# Patient Record
Sex: Male | Born: 1950 | Race: White | Hispanic: No | Smoking: Never smoker
Health system: Southern US, Community
[De-identification: ages and names within clinical notes are randomized; demographics above are authoritative.]

## PROBLEM LIST (undated history)

## (undated) DIAGNOSIS — E7800 Pure hypercholesterolemia, unspecified: Secondary | ICD-10-CM

## (undated) DIAGNOSIS — K449 Diaphragmatic hernia without obstruction or gangrene: Secondary | ICD-10-CM

## (undated) DIAGNOSIS — R7303 Prediabetes: Secondary | ICD-10-CM

## (undated) DIAGNOSIS — G479 Sleep disorder, unspecified: Secondary | ICD-10-CM

## (undated) DIAGNOSIS — G8929 Other chronic pain: Secondary | ICD-10-CM

## (undated) DIAGNOSIS — R32 Unspecified urinary incontinence: Secondary | ICD-10-CM

## (undated) DIAGNOSIS — I10 Essential (primary) hypertension: Secondary | ICD-10-CM

## (undated) DIAGNOSIS — M549 Dorsalgia, unspecified: Secondary | ICD-10-CM

## (undated) DIAGNOSIS — E785 Hyperlipidemia, unspecified: Secondary | ICD-10-CM

## (undated) DIAGNOSIS — N35919 Unspecified urethral stricture, male, unspecified site: Secondary | ICD-10-CM

## (undated) DIAGNOSIS — M545 Low back pain, unspecified: Secondary | ICD-10-CM

## (undated) DIAGNOSIS — Z972 Presence of dental prosthetic device (complete) (partial): Secondary | ICD-10-CM

## (undated) DIAGNOSIS — N529 Male erectile dysfunction, unspecified: Secondary | ICD-10-CM

## (undated) DIAGNOSIS — K219 Gastro-esophageal reflux disease without esophagitis: Secondary | ICD-10-CM

## (undated) DIAGNOSIS — F1190 Opioid use, unspecified, uncomplicated: Secondary | ICD-10-CM

## (undated) DIAGNOSIS — M5137 Other intervertebral disc degeneration, lumbosacral region: Secondary | ICD-10-CM

## (undated) DIAGNOSIS — Q396 Congenital diverticulum of esophagus: Secondary | ICD-10-CM

## (undated) DIAGNOSIS — Z860101 Personal history of adenomatous and serrated colon polyps: Secondary | ICD-10-CM

## (undated) DIAGNOSIS — C61 Malignant neoplasm of prostate: Secondary | ICD-10-CM

## (undated) DIAGNOSIS — Z8739 Personal history of other diseases of the musculoskeletal system and connective tissue: Secondary | ICD-10-CM

## (undated) DIAGNOSIS — Z8619 Personal history of other infectious and parasitic diseases: Secondary | ICD-10-CM

## (undated) DIAGNOSIS — M51379 Other intervertebral disc degeneration, lumbosacral region without mention of lumbar back pain or lower extremity pain: Secondary | ICD-10-CM

## (undated) DIAGNOSIS — R200 Anesthesia of skin: Secondary | ICD-10-CM

## (undated) DIAGNOSIS — Z923 Personal history of irradiation: Secondary | ICD-10-CM

## (undated) DIAGNOSIS — C801 Malignant (primary) neoplasm, unspecified: Secondary | ICD-10-CM

## (undated) DIAGNOSIS — R0683 Snoring: Secondary | ICD-10-CM

## (undated) DIAGNOSIS — M1990 Unspecified osteoarthritis, unspecified site: Secondary | ICD-10-CM

## (undated) DIAGNOSIS — D509 Iron deficiency anemia, unspecified: Secondary | ICD-10-CM

## (undated) DIAGNOSIS — Z86010 Personal history of colonic polyps: Secondary | ICD-10-CM

## (undated) HISTORY — DX: Gastro-esophageal reflux disease without esophagitis: K21.9

## (undated) HISTORY — PX: COLONOSCOPY: SHX174

## (undated) HISTORY — PX: UPPER GASTROINTESTINAL ENDOSCOPY: SHX188

## (undated) HISTORY — DX: Personal history of other infectious and parasitic diseases: Z86.19

## (undated) HISTORY — PX: EYE SURGERY: SHX253

## (undated) HISTORY — DX: Other chronic pain: G89.29

## (undated) HISTORY — DX: Snoring: R06.83

## (undated) HISTORY — DX: Dorsalgia, unspecified: M54.9

## (undated) HISTORY — PX: NECK SURGERY: SHX720

## (undated) HISTORY — DX: Personal history of irradiation: Z92.3

## (undated) HISTORY — DX: Unspecified osteoarthritis, unspecified site: M19.90

## (undated) HISTORY — DX: Sleep disorder, unspecified: G47.9

---

## 2004-07-25 HISTORY — PX: CATARACT EXTRACTION W/ INTRAOCULAR LENS IMPLANT: SHX1309

## 2005-02-11 DIAGNOSIS — C801 Malignant (primary) neoplasm, unspecified: Secondary | ICD-10-CM | POA: Insufficient documentation

## 2005-02-11 HISTORY — DX: Malignant (primary) neoplasm, unspecified: C80.1

## 2005-03-25 HISTORY — PX: PROSTATECTOMY: SHX69

## 2005-04-18 ENCOUNTER — Encounter (INDEPENDENT_AMBULATORY_CARE_PROVIDER_SITE_OTHER): Payer: Self-pay | Admitting: *Deleted

## 2005-04-18 ENCOUNTER — Inpatient Hospital Stay (HOSPITAL_COMMUNITY): Admission: RE | Admit: 2005-04-18 | Discharge: 2005-04-21 | Payer: Self-pay | Admitting: Urology

## 2005-04-18 HISTORY — PX: RETROPUBIC PROSTATECTOMY: SUR1055

## 2005-06-29 ENCOUNTER — Encounter (HOSPITAL_COMMUNITY): Admission: RE | Admit: 2005-06-29 | Discharge: 2005-09-27 | Payer: Self-pay | Admitting: Urology

## 2005-07-12 ENCOUNTER — Ambulatory Visit: Admission: RE | Admit: 2005-07-12 | Discharge: 2005-07-15 | Payer: Self-pay | Admitting: Radiation Oncology

## 2005-07-28 ENCOUNTER — Ambulatory Visit: Admission: RE | Admit: 2005-07-28 | Discharge: 2005-09-30 | Payer: Self-pay | Admitting: Radiation Oncology

## 2009-07-16 ENCOUNTER — Ambulatory Visit (HOSPITAL_BASED_OUTPATIENT_CLINIC_OR_DEPARTMENT_OTHER): Admission: RE | Admit: 2009-07-16 | Discharge: 2009-07-16 | Payer: Self-pay | Admitting: Urology

## 2009-07-16 HISTORY — PX: CYSTOSCOPY WITH URETHRAL DILATATION: SHX5125

## 2010-12-10 NOTE — Op Note (Signed)
NAME:  Matthew [name], Matthew [name] NO.:  [id number]   MEDICAL RECORD NO.:  [id number]          PATIENT TYPE:  INP   LOCATION:  X003                         FACILITY:  [facility]   PHYSICIAN:  Bertram Millard. Dahlstedt, M.D.DATE OF BIRTH:  02-16-1951   DATE OF PROCEDURE:  04/18/2005  DATE OF DISCHARGE:                                 OPERATIVE REPORT   PREOPERATIVE DIAGNOSIS:  Prostate cancer.   POSTOPERATIVE DIAGNOSIS:  Prostate cancer.   PROCEDURE:  Radical retropubic prostatectomy with bilateral pelvic lymph  node dissection.   SURGEON:  Bertram Millard. Dahlstedt, MD.   ASSISTANT:  Glade Nurse, MD.   ANESTHESIA:  General.   SPECIMENS:  1.  Bilateral pelvic lymph node packets.  2.  Prostate with seminal vesicles.   COMPLICATIONS:  None.   HISTORY OF PRESENT ILLNESS:  The patient presented with a PSA of 4.3. He  underwent TRUS biopsy of his prostate which revealed Gleason 3+3=6  bilaterally with 40% of tissue on the left which was associated with  nodularity and approximately 15% on the right. After reviewing his options,  he has elected to proceed with retropubic prostatectomy.   DESCRIPTION OF PROCEDURE:  The patient was identified by his wrist bracelet  and brought to room 10 where he received preoperative antibiotics and  prepped and draped in the usual sterile fashion. Next a 20-French Foley  catheter was inserted into the anterior urethra. We made a low midline  incision from the pubic symphysis to approximately 3 cm below the umbilicus.  We carried it down to the anterior fascial sheath with Bovie cautery  maintaining hemostasis. The linea alba was identified and split with Bovie  cautery. We dissected bluntly between the Pyramidalis and rectus. Next  Bookwalter was set. The space of Retzius was developed and we set our  retractors to expose the left obturator lymph node packet. We identified the  external iliac vein and artery and the inferior line obturator nerve.  Next  using the split and roll technique on the external iliac vein, we removed  all tissue between the external vein superiorly, the side wall laterally and  the obturator nerve inferiorly being careful not to damage the obturator  nerve in the dissection. We ligated and divided the pelvic lymph node packet  with Weck clips sharply. It was passed off the field for pathologic  analysis. We then repeated this procedure on the patient's right. Next we  reset the retractors and leveled the table. Using a sponge on a stick and a  Kitner, we bluntly dissected off the fatty tissue overlying the dorsal  venous complex as well as laterally. We identified the groove between the  levator and the prostate and divided the endopelvic fascia with Bovie  cautery sharply along this line bilaterally. Next, we identified the  puboprostatic ligaments and divided them sharply with Metzenbaum scissors.  Next we palpated a groove between the dorsal venous complex and the urethra  distal to the apex of the prostate. We passed a Hoenfeltner pelvic clamp  between this plane and ligated the dorsal venous complex with the  2-0  Vicryl. Next, we repeated this process and again ligated it with a 2-0  Vicryl. We then further retracted the bladder and prostate with a Stamey  retractor and passed the distal end of the aforementioned suture which was  on a needle through the dorsal venous complex distally to this and then  through the underside of the pubic symphysis and tied it. Next we placed a  back bleeding stitch with an interrupted 2-0 figure-of-eight Vicryl at the  level of the base of the prostate. Next, we divided the dorsal venous  complex proximal to the aforementioned sutures with Bovie cautery. The  prostate dropped away nicely hemostasis, hemostasis was further augmented by  placing interrupted figure-of-eight ties in the anterior surface of the  prostate. Next we bluntly, with Kittner, delineated the urethra  distal to  the apex of the prostate. We passed the right angle clamp behind it and  passed an umbilical tape through this. We divided the anterior surface of  the urethra with a knife distal to the apex prostate. The Foley catheter was  divided at the __________ and brought through. Next using the Foley as a  retractor and pulling up on the aforementioned umbilical tape, we divided  the posterior half of the urethra sharply with Metzenbaum scissors. Next we  developed the plane between the apex of the prostate and Denonvilliers'  fascia sharply with Metzenbaum scissors and proceeded in the midline towards  the base of the prostate. Once this was developed, we addressed our  prostatic pedicles dissecting them with right angle clamp, ligating them  with Weck clips and dividing sharply. We proceeded in this fashion  bilaterally to the base of prostate. Next we turned our attention to the  bladder neck and using fine dissection, we dissected down with Vanderbilt  tonsil in the plane between the base of the bladder and the base of the  prostate exposing only the urethra at this level which was then incised  sharply over the anterior half with a knife. The Foley catheter was brought  through this division and the remainder of the urethra was divided with  Metzenbaum scissors. Next the remaining small amount of pedicle at the  prostatic base was divided as described above. Next, this left only the  seminal vesicles and vas deferens. The vas on each side was dissected out  sharply and ligated and divided with Weck clips and with Metzenbaum  scissors. This leaving only the seminal vesicles which were ligated with  Weck clips and divided. Next, the prostate was passed off the field. We  inspected the pelvic fossa and found it to be hemostatic. Next, we turned  our attention to the bladder neck for reconstruction. It required one single interrupted 2-0 chromic at the __________ o'clock position of  bladder neck,  to tennis racket the bladder neck. We then everted the mucosa  circumferentially with interrupted 4-0 chromic. Next we turned our attention  to the urethrovesical anastomosis. We placed a Greenwald sound as a guide  and placed sutures from an outside-in fashion at the 12, 2, 4, 87 and 10  o'clock positions. These were then brought through the bladder neck at an  inside out fashion at the corresponding levels. Next we removed the  [facility] sound and placed a 20-French Foley catheter inserted into the  bladder inflating the balloon with 15 mL of sterile water. Next the  retractors were removed and the anastomosis was completed by tying each  suture sequentially starting  posteriorly and moving anteriorly. We then  flushed the Foley catheter, it was found to be watertight and flushed  quantitatively. Next we placed a Blake drain in the right lower quadrant by  making a 0.5 cm transverse incision in the skin dissecting down through the  dermis with Bovie cautery. We then created a tunnel with Vanderbilt tonsil  and brought the blade through on a pass so it was secured in with  interrupted 3-0 nylon and connected to bulb. The fascia was then closed with  a #2 running PDS. The skin was closed with a with running subcuticular 4-0  PDS. Dressings were applied. The patient was reversed from his anesthesia  which he tolerated without complication. Please note Dr. Marcine Matar  was present and participated in all aspects of this case.     ______________________________  Glade Nurse, MD      Bertram Millard. Dahlstedt, M.D.  Electronically Signed    MT/MEDQ  D:  04/18/2005  T:  04/18/2005  Job:  [id number]

## 2010-12-10 NOTE — H&P (Signed)
NAME:  Matthew [name], Matthew [name] NO.:  [id number]   MEDICAL RECORD NO.:  [id number]          PATIENT TYPE:  INP   LOCATION:  X003                         FACILITY:  [facility]   PHYSICIAN:  Bertram Millard. Dahlstedt, M.D.DATE OF BIRTH:  November 03, 1950   DATE OF ADMISSION:  04/18/2005  DATE OF DISCHARGE:                                HISTORY & PHYSICAL   REASON FOR ADMISSION:  Prostate cancer.   BRIEF HISTORY:  Matthew [name] is a 60 year old male with biopsy-diagnosed  adenocarcinoma of the prostate.  He initially presented from Dr. Laurann Montana in June with a PSA of 4.3.  He underwent an ultrasound and biopsy.  This showed bilateral adenocarcinoma of the prostate.  He had a nodule of  the prostate on the left, and 60% of the tissue submitted on that side had  cancer.  On the right, 15% of the tissue submitted had cancer.  He has a  rather small gland.  No metastatic evaluation was performed due to the  patient's low PSA and relatively low Gleason  score.   He presented to my office for a prostate cancer consultation earlier last  month.  He has decided to undergo radical prostatectomy.  He has chosen this  from other treatments, including HIFU, radiation, and cryotherapy.  He is  aware of risks and complications of radical prostatectomy, including but not  limited to infection, bleeding, need for transfusion, rectal injury, urinary  incontinence, erectile dysfunction, DVT, and PE, among others.  He  understands these and desires to proceed.   His past medical history is significant for hypercholesterolemia.   The patient is married and has 1 grown child.  He is a Transport planner.  He chews tobacco, denies any cigarette use.  He does not  drink alcohol.  He is a native of [place].   FAMILY HISTORY:  Significant for sarcoma, diabetes, and apparently what  sounds like a cardiac tumor in his mother.   REVIEW OF SYSTEMS:  Noncontributory.  He has no erectile  dysfunction.   PHYSICAL EXAMINATION:  GENERAL:  A pleasant, healthy-appearing, middle-age  male.  VITAL SIGNS:  Blood pressure 112/71, pulse 66, respiratory rate 20,  temperature 99.  NECK:  Supple without thyromegaly or adenopathy.  CHEST:  Clear.  HEART:  Normal rate and rhythm.  ABDOMEN:  Soft, nondistended, nontender.  No masses, no megaly.  Bowel  sounds present.  No CVA tenderness or flank masses.  He had no inguinal  hernias.  GENITALIA:  Phallus was uncircumcised.  Foreskin retracts easily.  No penile  plaques, lesions, or fibrotic areas.  Glans and meatus normal.  Scrotal skin  normal.  Testicles normal.  Cords and epididymal structures normal.  Normal  anal sphincter tone.  Gland was 1+, with a left-sided nodule 3-4 mm in size.  Seminal vesicles were nonpalpable.  EXTREMITIES:  He had normal distal pedal pulses.  NEUROLOGIC:  Grossly intact.   Admission data included an EKG which was normal, chest x-ray which was  normal except for peribronchial thickening.  BMET and CBC were normal.  IMPRESSION:  1.  Hypercholesterolemia.  2.  Prostate cancer, clinical stage T1C, Gleason 6.   PLAN:  Admit for radical prostatectomy.      Bertram Millard. Dahlstedt, M.D.  Electronically Signed     SMD/MEDQ  D:  04/18/2005  T:  04/18/2005  Job:  [id number]   cc:   [name]. Cliffton Asters, M.D.  Fax: [phone]

## 2010-12-10 NOTE — Discharge Summary (Signed)
NAME:  Matthew [name], BATTERTON NO.:  [id number]   MEDICAL RECORD NO.:  [id number]          PATIENT TYPE:  INP   LOCATION:  1412                         FACILITY:  [facility]   PHYSICIAN:  Bertram Millard. Dahlstedt, M.D.DATE OF BIRTH:  [birth year]   DATE OF ADMISSION:  04/18/2005  DATE OF DISCHARGE:  04/21/2005                                 DISCHARGE SUMMARY   DISCHARGE DIAGNOSES:  Prostate cancer.   PROCEDURE:  Right upper retropubic prostatectomy with bilateral pelvic lymph  node dissection.   SURGEON:  Bertram Millard. Dahlstedt, M.D.   CONSULTATIONS:  None.   PRIMARY CARE PHYSICIAN:  [name]. Cliffton Asters, M.D.   HISTORY OF PRESENT ILLNESS:  This is a 60 year old gentleman who initially  was noted to have a PSA of 4.3. He underwent TRUS prostate biopsy which  demonstrated Gleason 3+3=6 bilaterally with 40% on the left and 15% on the  right. After reviewing his options, he elected to proceed with surgical  treatment.   HOSPITAL COURSE:  On April 18, 2005, the patient was taken to the  operating room and underwent the above named procedure which he tolerated  without complications. He was then taken to the PACU in stable condition  where he remained throughout his hospital stay. His hospital stay consisted  of progressive toleration of ambulation, pain and diet. On postoperative day  #3, his Jackson-Pratt drain output flowed to less then 10 mL per shift, it  was removed. It was then determined the patient was in stable condition to  be discharged home.   DISCHARGE INSTRUCTIONS:  The patient is instructed not to lift more than 10  pounds for the next 6 weeks. He is given routine wound care instructions,  instructed to call or return if he experiences any fever, chills, redness or  drainage from his wound or nausea or vomiting. He is instructed not to drive  while on narcotics or for the next 2 weeks. He is given Foley care  instructions and instructed to call or return  immediately if he experiences  any problems with his Foley catheter. He understands and agrees with these  instructions. He is also instructed to followup with Dr. [name] as  scheduled for cath removal as well as wound check.   DISCHARGE MEDICATIONS:  1.  Vicodin.  2.  Colace.  3.  Ciprofloxacin to begin 1 day prior to appointment with Dr. [name].     ______________________________  Glade Nurse, MD      Bertram Millard. Dahlstedt, M.D.  Electronically Signed    MT/MEDQ  D:  05/05/2005  T:  05/05/2005  Job:  [id number]

## 2011-07-16 ENCOUNTER — Ambulatory Visit (INDEPENDENT_AMBULATORY_CARE_PROVIDER_SITE_OTHER): Payer: BC Managed Care – PPO

## 2011-07-16 DIAGNOSIS — S065X9A Traumatic subdural hemorrhage with loss of consciousness of unspecified duration, initial encounter: Secondary | ICD-10-CM

## 2011-07-16 DIAGNOSIS — M25549 Pain in joints of unspecified hand: Secondary | ICD-10-CM

## 2011-10-17 ENCOUNTER — Other Ambulatory Visit: Payer: Self-pay | Admitting: Urology

## 2011-10-17 DIAGNOSIS — C61 Malignant neoplasm of prostate: Secondary | ICD-10-CM

## 2011-10-26 ENCOUNTER — Encounter (HOSPITAL_COMMUNITY)
Admission: RE | Admit: 2011-10-26 | Discharge: 2011-10-26 | Disposition: A | Discharge status: Home / Self Care | Payer: BC Managed Care – PPO | Source: Ambulatory Visit | Attending: Urology | Admitting: Urology

## 2011-10-26 DIAGNOSIS — C61 Malignant neoplasm of prostate: Secondary | ICD-10-CM | POA: Insufficient documentation

## 2011-10-26 MED ORDER — TECHNETIUM TC 99M MEDRONATE IV KIT
25.0000 | PACK | Freq: Once | INTRAVENOUS | Status: AC | PRN
  Administered 2011-10-26: 25 via INTRAVENOUS

## 2011-10-28 ENCOUNTER — Other Ambulatory Visit: Payer: Self-pay | Admitting: Urology

## 2011-10-28 DIAGNOSIS — C61 Malignant neoplasm of prostate: Secondary | ICD-10-CM

## 2011-10-31 ENCOUNTER — Other Ambulatory Visit (HOSPITAL_COMMUNITY): Payer: Self-pay | Admitting: Physician Assistant

## 2011-11-01 ENCOUNTER — Ambulatory Visit (HOSPITAL_COMMUNITY)
Admission: RE | Admit: 2011-11-01 | Discharge: 2011-11-01 | Disposition: A | Discharge status: Home / Self Care | Payer: BC Managed Care – PPO | Source: Ambulatory Visit | Attending: Urology | Admitting: Urology

## 2011-11-01 ENCOUNTER — Encounter (HOSPITAL_COMMUNITY): Payer: Self-pay

## 2011-11-01 VITALS — BP 144/67 | HR 75 | Temp 98.0°F | Resp 18 | Ht 68.0 in | Wt 196.0 lb

## 2011-11-01 DIAGNOSIS — E7800 Pure hypercholesterolemia, unspecified: Secondary | ICD-10-CM | POA: Insufficient documentation

## 2011-11-01 DIAGNOSIS — R972 Elevated prostate specific antigen [PSA]: Secondary | ICD-10-CM | POA: Insufficient documentation

## 2011-11-01 DIAGNOSIS — Z8546 Personal history of malignant neoplasm of prostate: Secondary | ICD-10-CM | POA: Insufficient documentation

## 2011-11-01 DIAGNOSIS — C61 Malignant neoplasm of prostate: Secondary | ICD-10-CM

## 2011-11-01 DIAGNOSIS — Z923 Personal history of irradiation: Secondary | ICD-10-CM | POA: Insufficient documentation

## 2011-11-01 DIAGNOSIS — Z9079 Acquired absence of other genital organ(s): Secondary | ICD-10-CM | POA: Insufficient documentation

## 2011-11-01 DIAGNOSIS — I889 Nonspecific lymphadenitis, unspecified: Secondary | ICD-10-CM | POA: Insufficient documentation

## 2011-11-01 DIAGNOSIS — R599 Enlarged lymph nodes, unspecified: Secondary | ICD-10-CM | POA: Insufficient documentation

## 2011-11-01 HISTORY — DX: Pure hypercholesterolemia, unspecified: E78.00

## 2011-11-01 HISTORY — DX: Essential (primary) hypertension: I10

## 2011-11-01 HISTORY — DX: Malignant (primary) neoplasm, unspecified: C80.1

## 2011-11-01 LAB — CBC
HCT: 43.2 % (ref 39.0–52.0)
MCV: 91.1 fL (ref 78.0–100.0)
RBC: 4.74 MIL/uL (ref 4.22–5.81)
WBC: 5.5 10*3/uL (ref 4.0–10.5)

## 2011-11-01 MED ORDER — MIDAZOLAM HCL 2 MG/2ML IJ SOLN
INTRAMUSCULAR | Status: AC
  Filled 2011-11-01: qty 4

## 2011-11-01 MED ORDER — MIDAZOLAM HCL 5 MG/5ML IJ SOLN
INTRAMUSCULAR | Status: AC | PRN
  Administered 2011-11-01: 2 mg via INTRAVENOUS
  Administered 2011-11-01: 1 mg via INTRAVENOUS

## 2011-11-01 MED ORDER — HYDROCODONE-ACETAMINOPHEN 5-325 MG PO TABS
ORAL_TABLET | ORAL | Status: AC
Start: 2011-11-01 — End: 2011-11-01
  Filled 2011-11-01: qty 2

## 2011-11-01 MED ORDER — SODIUM CHLORIDE 0.9 % IV SOLN: INTRAVENOUS | Status: DC

## 2011-11-01 MED ORDER — HYDROCODONE-ACETAMINOPHEN 10-325 MG PO TABS
1.0000 | ORAL_TABLET | Freq: Four times a day (QID) | ORAL | Status: DC | PRN
  Filled 2011-11-01: qty 2

## 2011-11-01 MED ORDER — MIDAZOLAM HCL 2 MG/2ML IJ SOLN
INTRAMUSCULAR | Status: AC
  Filled 2011-11-01: qty 2

## 2011-11-01 MED ORDER — FENTANYL CITRATE 0.05 MG/ML IJ SOLN
INTRAMUSCULAR | Status: AC | PRN
  Administered 2011-11-01 (×2): 50 ug via INTRAVENOUS
  Administered 2011-11-01: 100 ug via INTRAVENOUS

## 2011-11-01 MED ORDER — FENTANYL CITRATE 0.05 MG/ML IJ SOLN
INTRAMUSCULAR | Status: AC
  Filled 2011-11-01: qty 2

## 2011-11-01 MED ORDER — FENTANYL CITRATE 0.05 MG/ML IJ SOLN
INTRAMUSCULAR | Status: AC
  Filled 2011-11-01: qty 4

## 2011-11-01 MED ORDER — HYDROCODONE-ACETAMINOPHEN 5-325 MG PO TABS
1.0000 | ORAL_TABLET | Freq: Four times a day (QID) | ORAL | Status: DC | PRN
  Administered 2011-11-01: 2 via ORAL
  Filled 2011-11-01: qty 2

## 2011-11-01 NOTE — H&P (Signed)
Matthew [name] is an 61 y.o. male.   Chief Complaint: adenopathy, elevating PSA with history of prostate cancer s/p prostatectomy and radiotherapy. HPI: Followed by Dr. [name] for prostate cancer. Elevating PSA.  F/U CT reveals adenopathy in the right external iliac lymph chain - request received for core biopsy to r/o metastatic disease versus new primary.   Past Medical History  Diagnosis Date  . Hypertension   . Cancer 2006    prostate cancer  . Hypercholesteremia     Past Surgical History  Procedure Date  . Prostatectomy 2006    Social History:  does not have a smoking history on file. His smokeless tobacco use includes Chew. He reports that he does not drink alcohol or use illicit drugs.  Allergies: No Known Allergies  Medications Prior to Admission  Medication Sig Dispense Refill  . amLODipine-valsartan (EXFORGE) 5-160 MG per tablet Take 1 tablet by mouth daily.      . rosuvastatin (CRESTOR) 10 MG tablet Take 10 mg by mouth daily.       Medications Prior to Admission  Medication Dose Route Frequency Provider Last Rate Last Dose  . 0.9 %  sodium chloride infusion   Intravenous Continuous Simonne Come, MD        Review of Systems  Constitutional: Negative for chills.  Eyes: Negative.   Respiratory: Negative for cough and hemoptysis.   Cardiovascular: Negative for chest pain, palpitations, claudication and leg swelling.  Gastrointestinal: Negative for abdominal pain.  Musculoskeletal: Negative for back pain.  Neurological: Negative for seizures.  Endo/Heme/Allergies: Negative.   Psychiatric/Behavioral: Negative.     Blood pressure 148/76, pulse 65, temperature 98 F (36.7 C), resp. rate 15, height 5\' 8"  (1.727 m), weight 196 lb (88.905 kg), SpO2 98.00%. Physical Exam  Constitutional: He is oriented to person, place, and time. He appears well-developed and well-nourished. No distress.  HENT:  Head: Normocephalic and atraumatic.  Cardiovascular: Normal rate, regular  rhythm and normal heart sounds.  Exam reveals no gallop and no friction rub.   No murmur heard. Respiratory: Effort normal and breath sounds normal. No respiratory distress. He has no wheezes.  GI: Soft. Bowel sounds are normal.  Musculoskeletal: Normal range of motion.  Neurological: He is alert and oriented to person, place, and time.  Skin: Skin is warm and dry.  Psychiatric: He has a normal mood and affect. His behavior is normal. Judgment and thought content normal.     Assessment/Plan Procedure details discussed with patient with his apparent understanding.  Potential complications including but not limited to infection, bleeding, inadequate sampling and possible complications with moderate sedation discussed.  Written consent obtained.  Proceed with biopsy if final labs WNL.   Jaques Mineer D 11/01/2011, 10:25 AM

## 2011-11-01 NOTE — Procedures (Signed)
Technically successful CT guided biopsy of right external iliac lymph node. No immediate complications. Awaiting pathology report.

## 2011-11-29 ENCOUNTER — Ambulatory Visit: Payer: BC Managed Care – PPO | Admitting: Radiation Oncology

## 2011-11-29 ENCOUNTER — Ambulatory Visit: Payer: BC Managed Care – PPO

## 2011-11-30 ENCOUNTER — Encounter: Payer: Self-pay | Admitting: Radiation Oncology

## 2011-11-30 ENCOUNTER — Ambulatory Visit
Admission: RE | Admit: 2011-11-30 | Discharge: 2011-11-30 | Disposition: A | Discharge status: Home / Self Care | Payer: BC Managed Care – PPO | Source: Ambulatory Visit | Attending: Radiation Oncology | Admitting: Radiation Oncology

## 2011-11-30 ENCOUNTER — Encounter: Payer: Self-pay | Admitting: *Deleted

## 2011-11-30 VITALS — BP 152/88 | HR 90 | Temp 98.2°F | Resp 20 | Ht 68.0 in | Wt 199.9 lb

## 2011-11-30 DIAGNOSIS — Z510 Encounter for antineoplastic radiation therapy: Secondary | ICD-10-CM | POA: Insufficient documentation

## 2011-11-30 DIAGNOSIS — C61 Malignant neoplasm of prostate: Secondary | ICD-10-CM | POA: Insufficient documentation

## 2011-11-30 DIAGNOSIS — C801 Malignant (primary) neoplasm, unspecified: Secondary | ICD-10-CM

## 2011-11-30 NOTE — Progress Notes (Signed)
Followup note:  Diagnosis: Recurrent carcinoma the prostate  History: Matthew [name] is seen today to request of Dr  [name] for consideration of prophylactic breast irradiation to prevent painful gynecomastia from upcoming Casodex hormone therapy. I first saw the patient in consultation back in December 2006 for PSA recurrent carcinoma the prostate. His PSA fell but rose again by July 2012 at 3.74 by November 2012, and more recently 6.69 in March of 2013. His staging workup including a bone scan and CT scan were negative except for a 1.4 cm right external iliac lymph node that was biopsied and found to represent only fibrosis. There were several small areas of sclerotic activity seen on CT that his bone scan was negative. Dr. [name] plans on starting Casodex/finasteride in the near future. The patient is without complaints today except for discomfort along his hip and lower back from a fall in his shower back in January. Dr. [name] would like to delay androgen deprivation therapy to maintain his quality of life.  Physical examination : Alert and oriented. Wt Readings from Last 3 Encounters:  11/30/11 199 lb 14.4 oz (90.674 kg)  11/01/11 196 lb (88.905 kg)   Temp Readings from Last 3 Encounters:  11/30/11 98.2 F (36.8 C) Oral  11/01/11 98 F (36.7 C)    BP Readings from Last 3 Encounters:  11/30/11 152/88  11/01/11 144/67   Pulse Readings from Last 3 Encounters:  11/30/11 90  11/01/11 75    Breasts: There is no gynecomastia.  Laboratory data: Last PSA 4.9 from 11/21/2011  Impression: Recurrent carcinoma the prostate with upcoming Casodex hormone therapy. He is a candidate for prophylactic breast radiation. I plan to deliver 3 fractions of radiation therapy (1500 cGy/3 sessions) next week.  Plan: He will return for electron beam simulation in the near future. Consent is signed today.  30 minutes was spent face-to-face with the patient, primarily counseling the patient and  coordinating his care.

## 2011-11-30 NOTE — Progress Notes (Signed)
For discussion of radiation tx to breasts prior to Casodex initiation.

## 2011-11-30 NOTE — Progress Notes (Signed)
Please see the Nurse Progress Note in the MD Initial Consult Encounter for this patient. 

## 2011-11-30 NOTE — Progress Notes (Signed)
Pt has chronic low back/hip pain since fall in January. All tests/scans negative.  Pt states he was taking Vesicare from Dr [name]  for his urinary freq, but pt cannot tell difference. Now has unidentified med instead of Vesicare, possibly Flomax, but pt cannot tell improvement.  Nocturia q 2 hr. Daytime freq q 1hr, does not feel he is emptying bladder, urgency, leakage since prostatectomy -2006, has to force to void at times, weak stream.  Pt to begin Casodex, for consult re: breast radiation prior to initiation of med.

## 2011-12-01 NOTE — Progress Notes (Signed)
Encounter addended by: Glennie Hawk, RN on: 12/01/2011  1:31 PM<BR>     Documentation filed: Charges VN

## 2011-12-05 ENCOUNTER — Ambulatory Visit
Admission: RE | Admit: 2011-12-05 | Discharge: 2011-12-05 | Disposition: A | Discharge status: Home / Self Care | Payer: BC Managed Care – PPO | Source: Ambulatory Visit | Attending: Radiation Oncology | Admitting: Radiation Oncology

## 2011-12-05 DIAGNOSIS — C801 Malignant (primary) neoplasm, unspecified: Secondary | ICD-10-CM

## 2011-12-05 MED ORDER — HYDROCODONE-ACETAMINOPHEN 5-500 MG PO CAPS: 1.0000 | ORAL_CAPSULE | Freq: Four times a day (QID) | ORAL | Status: AC | PRN

## 2011-12-05 NOTE — Progress Notes (Signed)
Electron beam simulation/treatment planning note: The patient was taken to the [facility]. He was set up RAO to the right breast and LAO to left breast. One standard block was utilized for both fields. He'll have obstruction of 0.5 cm custom bolus on the first day of his treatment, 12/06/2011. A special port plan is requested. I prescribing 1500 cGy in 3 sessions utilizing 9 MEV electrons.

## 2011-12-06 ENCOUNTER — Ambulatory Visit
Admission: RE | Admit: 2011-12-06 | Discharge: 2011-12-06 | Disposition: A | Discharge status: Home / Self Care | Payer: BC Managed Care – PPO | Source: Ambulatory Visit | Attending: Radiation Oncology | Admitting: Radiation Oncology

## 2011-12-07 ENCOUNTER — Ambulatory Visit
Admission: RE | Admit: 2011-12-07 | Discharge: 2011-12-07 | Disposition: A | Discharge status: Home / Self Care | Payer: BC Managed Care – PPO | Source: Ambulatory Visit | Attending: Radiation Oncology | Admitting: Radiation Oncology

## 2011-12-07 ENCOUNTER — Encounter: Payer: Self-pay | Admitting: Radiation Oncology

## 2011-12-07 NOTE — Progress Notes (Signed)
Weekly Management Note:  Site:Bilateral breasts Current Dose:  1000  cGy Projected Dose: 1500  cGy  Narrative: The patient is seen today for routine under treatment assessment. CBCT/MVCT images/port films were reviewed. The chart was reviewed.   Treatment setup is excellent. No complaints today.  Physical Examination: There were no vitals filed for this visit..  Weight:  . No skin changes.  Impression: Tolerating radiation therapy well.  Plan: Continue radiation therapy as planned. He'll complete his radiation therapy tomorrow. No need for a followup visit. He may start his Casodex in the near future.

## 2011-12-08 ENCOUNTER — Ambulatory Visit
Admission: RE | Admit: 2011-12-08 | Discharge: 2011-12-08 | Disposition: A | Discharge status: Home / Self Care | Payer: BC Managed Care – PPO | Source: Ambulatory Visit | Attending: Radiation Oncology | Admitting: Radiation Oncology

## 2011-12-10 ENCOUNTER — Encounter: Payer: Self-pay | Admitting: Radiation Oncology

## 2011-12-10 NOTE — Progress Notes (Signed)
[facility] Radiation Oncology End of Treatment Note  Name:Matthew [name]  Date: 12/10/2011 WRU:[id number] DOB:[birth year]   Status:outpatient    CC: Dr. Marcine Matar  REFERRING PHYSICIAN:   Dr. Marcine Matar   DIAGNOSIS: There were no encounter diagnoses.    INDICATION FOR TREATMENT: Prophylatic, prevention of painful gynecomastia   TREATMENT DATES: 12/06/2011 through 12/08/2011                          SITE/DOSE:    Left and right breasts 1500 cGy 3 sessions                        BEAMS/ENERGY:  6 MEV electrons with 0.5 cm bolus to maximize the dose to the superficial breast                 NARRATIVE:   The patient tolerated treatment well without any toxicity.                         PLAN: He may now start his Casodex and be followed by Dr. [name]. No formal followup here.

## 2012-02-19 ENCOUNTER — Ambulatory Visit (INDEPENDENT_AMBULATORY_CARE_PROVIDER_SITE_OTHER): Payer: BC Managed Care – PPO | Admitting: Emergency Medicine

## 2012-02-19 ENCOUNTER — Ambulatory Visit: Payer: BC Managed Care – PPO

## 2012-02-19 VITALS — BP 149/79 | HR 78 | Temp 99.2°F | Resp 18 | Wt 198.0 lb

## 2012-02-19 DIAGNOSIS — R05 Cough: Secondary | ICD-10-CM

## 2012-02-19 DIAGNOSIS — J40 Bronchitis, not specified as acute or chronic: Secondary | ICD-10-CM

## 2012-02-19 MED ORDER — HYDROCODONE-HOMATROPINE 5-1.5 MG/5ML PO SYRP: 5.0000 mL | ORAL_SOLUTION | Freq: Three times a day (TID) | ORAL | Status: AC | PRN

## 2012-02-19 MED ORDER — AZITHROMYCIN 250 MG PO TABS: ORAL_TABLET | ORAL | Status: AC

## 2012-02-19 NOTE — Progress Notes (Signed)
  Subjective:    Patient ID: Matthew [name], male    DOB: January 12, 1951, 61 y.o.   MRN: [id number]  HPI patient started feeling bad on Wednesday to his wife has been sick for 10 days but he didn't start feeling bad until Wednesday. He has had mild head congestion mild sore throat but his biggest problem has been chest congestion and a cough which has been productive of a large amount of mucous which has been discolored he had no shortness of breath. He has had no chest pain.    Review of Systems     Objective:   Physical Exam  Constitutional: He appears well-developed.  HENT:  Head: Normocephalic.  Eyes: Pupils are equal, round, and reactive to light.  Neck: No thyromegaly present.  Cardiovascular: Normal rate and regular rhythm.   Pulmonary/Chest: No respiratory distress.       There are coarse rhonchi present in both bases.  Abdominal: Soft. Bowel sounds are normal. There is no tenderness. There is no rebound.  Skin: Skin is warm and dry.    [facility] reading (PRIMARY) by  Dr.Daub chest x-ray shows no acute disease        Assessment & Plan:  Patient here with a bronchitis. We'll treat with Hycodan and Z-Pak recheck in 48-72 hours if not better

## 2012-02-28 ENCOUNTER — Other Ambulatory Visit: Payer: Self-pay | Admitting: Emergency Medicine

## 2012-04-27 ENCOUNTER — Encounter (INDEPENDENT_AMBULATORY_CARE_PROVIDER_SITE_OTHER): Payer: Self-pay | Admitting: General Surgery

## 2012-04-27 ENCOUNTER — Ambulatory Visit (INDEPENDENT_AMBULATORY_CARE_PROVIDER_SITE_OTHER): Payer: BC Managed Care – PPO | Admitting: General Surgery

## 2012-04-27 VITALS — BP 150/84 | HR 73 | Temp 98.9°F | Resp 18 | Ht 68.0 in | Wt 196.7 lb

## 2012-04-27 DIAGNOSIS — K4090 Unilateral inguinal hernia, without obstruction or gangrene, not specified as recurrent: Secondary | ICD-10-CM | POA: Insufficient documentation

## 2012-04-27 NOTE — Progress Notes (Signed)
Subjective:     Patient ID: Matthew [name], male   DOB: [birth year], 61 y.o.   MRN: [id number]  HPI We're asked to see the patient in consultation by Dr. [name] to evaluate him for a right inguinal hernia. The patient is a 61 year old white male who began having a stinging sensation in his right groin back in April. His discomfort has steadily gotten worse since then. He is also started noticing a bulge in the right groin. The bulge has gotten bigger over the last few weeks. He denies any nausea or vomiting. He does have normal bowel movements. He lifts a lot of heavy equipment work and feels that may have been the cause of the hernia.  Review of Systems  Constitutional: Negative.   HENT: Negative.   Eyes: Negative.   Respiratory: Negative.   Cardiovascular: Negative.   Gastrointestinal: Negative.   Genitourinary: Negative.   Musculoskeletal: Negative.   Skin: Negative.   Neurological: Negative.   Hematological: Negative.   Psychiatric/Behavioral: Negative.        Objective:   Physical Exam  Constitutional: He is oriented to person, place, and time. He appears well-developed and well-nourished.  HENT:  Head: Atraumatic.  Eyes: Conjunctivae normal and EOM are normal. Pupils are equal, round, and reactive to light.  Neck: Normal range of motion. Neck supple.  Cardiovascular: Normal rate, regular rhythm and normal heart sounds.   Pulmonary/Chest: Effort normal and breath sounds normal.  Abdominal: Bowel sounds are normal. He exhibits no distension. There is no tenderness.  Genitourinary:       There is a palpable tender bulge in the right groin. No palpable bulge or impulse with straining in the left groin.  Musculoskeletal: Normal range of motion.  Neurological: He is alert and oriented to person, place, and time.  Skin: Skin is warm and dry.  Psychiatric: He has a normal mood and affect. His behavior is normal.       Assessment:     The patient has a symptomatic right inguinal  hernia. Because of the risk of incarceration strenuousness think he would benefit from having this fixed. I've discussed with him in detail the risks and benefits of the operation to fix the hernia as well as some of the technical aspects including the use of mesh and the risk of injury to the vas and testicular artery and he understands and wishes to proceed    Plan:     Plan for right inguinal hernia repair with mesh

## 2012-04-27 NOTE — Patient Instructions (Signed)
Plan for right inguinal hernia repair with mesh 

## 2012-05-08 ENCOUNTER — Encounter: Payer: Self-pay | Admitting: Family Medicine

## 2012-05-23 DIAGNOSIS — K4090 Unilateral inguinal hernia, without obstruction or gangrene, not specified as recurrent: Secondary | ICD-10-CM

## 2012-05-23 HISTORY — PX: HERNIA REPAIR: SHX51

## 2012-05-23 HISTORY — PX: INGUINAL HERNIA REPAIR: SUR1180

## 2012-05-28 ENCOUNTER — Telehealth (INDEPENDENT_AMBULATORY_CARE_PROVIDER_SITE_OTHER): Payer: Self-pay | Admitting: General Surgery

## 2012-05-28 NOTE — Telephone Encounter (Signed)
Pt called for pain med refill; had surgery last Wednesday (05/23/12.)  Per standing orders, Hydrocodone 5/325 mg, # 30, 1-2 po Q 4-6 H prn pain, no refill called to [facility]. Market:  [phone].

## 2012-06-05 ENCOUNTER — Ambulatory Visit (INDEPENDENT_AMBULATORY_CARE_PROVIDER_SITE_OTHER): Payer: BC Managed Care – PPO | Admitting: General Surgery

## 2012-06-05 ENCOUNTER — Encounter (INDEPENDENT_AMBULATORY_CARE_PROVIDER_SITE_OTHER): Payer: Self-pay | Admitting: General Surgery

## 2012-06-05 VITALS — BP 144/78 | HR 68 | Temp 97.5°F | Ht 68.0 in | Wt 191.4 lb

## 2012-06-05 DIAGNOSIS — K4090 Unilateral inguinal hernia, without obstruction or gangrene, not specified as recurrent: Secondary | ICD-10-CM

## 2012-06-05 NOTE — Progress Notes (Signed)
Subjective:     Patient ID: Matthew [name], male   DOB: 03-22-1951, 61 y.o.   MRN: [id number]  HPI The patient is a 61 year old white male who is 2 weeks status post right inguinal hernia repair with mesh. He had some significant pain and soreness early on. This is gradually improving. His appetite is good and his bowels are working normally.  Review of Systems     Objective:   Physical Exam On exam his abdomen is soft and nontender. His right inguinal incision is healing nicely with no sign of infection. He has no palpable evidence for recurrence of his hernia. He is still tender to palpation in the right groin.    Assessment:     2 weeks status post right inguinal hernia repair with mesh    Plan:     At this point I would like him to refrain from any heavy lifting for another month. I think it would be safe for him to go back to work doing light duty on the 18th. We will plan to see him back in one month's check his progress.

## 2012-06-05 NOTE — Patient Instructions (Signed)
No heavy lifting for another month 

## 2012-06-28 ENCOUNTER — Encounter (INDEPENDENT_AMBULATORY_CARE_PROVIDER_SITE_OTHER): Payer: Self-pay | Admitting: General Surgery

## 2012-06-28 ENCOUNTER — Ambulatory Visit (INDEPENDENT_AMBULATORY_CARE_PROVIDER_SITE_OTHER): Payer: BC Managed Care – PPO | Admitting: General Surgery

## 2012-06-28 VITALS — BP 128/72 | HR 84 | Temp 97.0°F | Resp 18 | Ht 68.0 in | Wt 197.4 lb

## 2012-06-28 DIAGNOSIS — K4090 Unilateral inguinal hernia, without obstruction or gangrene, not specified as recurrent: Secondary | ICD-10-CM

## 2012-06-28 NOTE — Patient Instructions (Signed)
May return to all normal activities on dec 8

## 2012-06-28 NOTE — Progress Notes (Signed)
Subjective:     Patient ID: Matthew [name], male   DOB: November 13, 1950, 61 y.o.   MRN: [id number]  HPI The patient is a 61 year old white male who is 5 weeks status post right inguinal hernia repair with mesh. He continues to have some numbness and sensitivity around the incision but this is gradually improving. His appetite is good and his bowels are working normally.  Review of Systems     Objective:   Physical Exam On exam his abdomen is soft and nontender. His right inguinal incision is healing nicely with no sign of infection. He has no palpable evidence for recurrence of the hernia. He still has some sensitivity and numbness around the incision.    Assessment:     5 weeks status post right inguinal hernia repair with mesh    Plan:     At this point I think he can return to work next week. We will plan to see him back on a when necessary basis. I believe if he can massage the incision it help with some of the sensitivity.

## 2012-07-11 ENCOUNTER — Encounter (INDEPENDENT_AMBULATORY_CARE_PROVIDER_SITE_OTHER): Payer: BC Managed Care – PPO | Admitting: General Surgery

## 2012-08-03 ENCOUNTER — Telehealth: Payer: Self-pay

## 2012-08-03 NOTE — Telephone Encounter (Signed)
Patient would like a copy of his most recent xrays.  Best [phone]

## 2012-08-06 NOTE — Telephone Encounter (Signed)
Printed request and placed on xray desk Friday Jan 10th.

## 2012-08-14 ENCOUNTER — Other Ambulatory Visit: Payer: Self-pay | Admitting: Family Medicine

## 2012-08-14 DIAGNOSIS — M5020 Other cervical disc displacement, unspecified cervical region: Secondary | ICD-10-CM

## 2012-08-14 DIAGNOSIS — M545 Low back pain: Secondary | ICD-10-CM

## 2012-08-19 ENCOUNTER — Ambulatory Visit
Admission: RE | Admit: 2012-08-19 | Discharge: 2012-08-19 | Disposition: A | Discharge status: Home / Self Care | Payer: BC Managed Care – PPO | Source: Ambulatory Visit | Attending: Family Medicine | Admitting: Family Medicine

## 2012-08-19 DIAGNOSIS — M545 Low back pain: Secondary | ICD-10-CM

## 2012-11-02 ENCOUNTER — Telehealth: Payer: Self-pay | Admitting: *Deleted

## 2012-11-02 NOTE — Telephone Encounter (Signed)
Message left that we received a referral from Dr. Murray Hodgkins.  Please call to schedule a Sleep Consult.

## 2012-11-15 ENCOUNTER — Ambulatory Visit (INDEPENDENT_AMBULATORY_CARE_PROVIDER_SITE_OTHER): Payer: BC Managed Care – PPO | Admitting: Neurology

## 2012-11-15 ENCOUNTER — Encounter: Payer: Self-pay | Admitting: Neurology

## 2012-11-15 VITALS — BP 192/68 | HR 72 | Temp 98.3°F | Ht 68.0 in | Wt 197.0 lb

## 2012-11-15 DIAGNOSIS — R0989 Other specified symptoms and signs involving the circulatory and respiratory systems: Secondary | ICD-10-CM

## 2012-11-15 DIAGNOSIS — G8929 Other chronic pain: Secondary | ICD-10-CM

## 2012-11-15 DIAGNOSIS — M549 Dorsalgia, unspecified: Secondary | ICD-10-CM

## 2012-11-15 DIAGNOSIS — R0609 Other forms of dyspnea: Secondary | ICD-10-CM

## 2012-11-15 DIAGNOSIS — G479 Sleep disorder, unspecified: Secondary | ICD-10-CM

## 2012-11-15 DIAGNOSIS — R0683 Snoring: Secondary | ICD-10-CM

## 2012-11-15 HISTORY — DX: Other chronic pain: G89.29

## 2012-11-15 HISTORY — DX: Sleep disorder, unspecified: G47.9

## 2012-11-15 HISTORY — DX: Snoring: R06.83

## 2012-11-15 NOTE — Progress Notes (Signed)
Subjective:    Patient ID: Matthew [name] is a 62 y.o. male.  HPI  Matthew Foley, MD, PhD [facility] [address], [address] [address] [place], [place] [address]  Dear Dr. Murray Hodgkins,   I saw your patient,Matthew [name], upon your kind request in my neurologic clinic today for initial consultation of his sleep disorder, concern for obstructive sleep apnea. The patient is unaccompanied today. As you know, Matthew [name] is a very pleasant 62 year old right-handed gentleman with a underlying medical history of chronic lower back pain, and prostate cancer in 2006, who has a longstanding history of snoring. He states his snoring is light and at times loud. He is tired during the day. He sleep is disrupted by his back pain and having to go to the restroom every three hours.  His typical bedtime is reported to be around 9 PM and usual wake time is around 5 AM. He reports feeling not rested upon awakening. He wakes up on an average 2 in the middle of the night and has to go to the bathroom 2 times on a typical night. He denies morning headaches.  He has been known to snore for the past many years. Snoring is reportedly mild, and not associated with choking sounds and witnessed apneas. The patient denies a sense of choking or strangling feeling. There is no report of nighttime reflux, with no nighttime cough experienced. The patient has not noted any RLS symptoms and is not known to kick while asleep or before falling asleep. He is not a particularly restless sleeper. He works as a Academic librarian and works 7d/week.  He denies cataplexy, sleep paralysis, hypnagogic or hypnopompic hallucinations, or sleep attacks. He does not report any vivid dreams, nightmares, dream enactments, or parasomnias, such as sleep talking or sleep walking. The patient has not had a sleep study, but he had a HST a week or two ago and was told, that he needed a sleep consultation. I do not have the results available at this time,  but will request the report from your office.   His Past Medical History Is Significant For: Past Medical History  Diagnosis Date  . Hypertension   . Cancer 02/11/2005    prostate cancer, gleason 3+3=6, vol 30 cc  . Hypercholesteremia   . Gout   . Arthritis   . History of shingles   . Hx of radiation therapy 08/04/05 to 09/22/2005    prostate bed    His Past Surgical History Is Significant For: Past Surgical History  Procedure Laterality Date  . Prostatectomy  03/2005    Gleason 3+4=7  . Neck surgery    . Hernia repair  05/23/12    RIH    His Family History Is Significant For: No family history on file.  His Social History Is Significant For: History   Social History  . Marital Status: Married    Spouse Name: N/A    Number of Children: N/A  . Years of Education: N/A   Social History Main Topics  . Smoking status: Never Smoker   . Smokeless tobacco: Current User    Types: Chew     Comment: 50 + yrs started chewing tobacco at 63 yrs old  . Alcohol Use: No  . Drug Use: No  . Sexually Active: None   Other Topics Concern  . None   Social History Narrative   Married, 1 son, pipe fitter    His Allergies Are:  No Known Allergies:  His Current Medications Are:  Outpatient Encounter Prescriptions as of 11/15/2012  Medication Sig Dispense Refill  . amitriptyline (ELAVIL) 25 MG tablet Take 25 mg by mouth at bedtime.      Marland Kitchen amLODipine (NORVASC) 5 MG tablet       . amLODipine-valsartan (EXFORGE) 5-160 MG per tablet Take 1 tablet by mouth daily.      . bicalutamide (CASODEX) 50 MG tablet Take 50 mg by mouth daily.      . fenofibrate 160 MG tablet Take 160 mg by mouth daily.      . finasteride (PROSCAR) 5 MG tablet Take 5 mg by mouth daily.      Marland Kitchen HYDROcodone-acetaminophen (VICODIN) 5-500 MG per tablet Take 1 tablet by mouth every 6 (six) hours as needed.      Marland Kitchen acetaminophen-codeine (TYLENOL #3) 300-30 MG per tablet       . HYDROMET 5-1.5 MG/5ML syrup        No  facility-administered encounter medications on file as of 11/15/2012.   Review of Systems  Constitutional: Positive for fatigue.    Objective:  Neurologic Exam  Physical Exam Physical Examination:   Filed Vitals:   11/15/12 0908  BP: 192/68  Pulse: 72  Temp: 98.3 F (36.8 C)   General Examination: The patient is a very pleasant 62 y.o. male in no acute distress.  HEENT: Normocephalic, atraumatic, pupils are equal, round and reactive to light and accommodation. Funduscopic exam is normal with sharp disc margins noted. Extraocular tracking is good without nystagmus noted. Normal smooth pursuit is noted. Hearing is grossly intact. Tympanic membranes are clear bilaterally. Face is symmetric with normal facial animation and normal facial sensation. Speech is clear with no dysarthria noted. There is no hypophonia. There is no lip, neck or jaw tremor. Neck is supple with full range of motion. There are no carotid bruits on auscultation. Oropharynx exam reveals normal findings. Mild airway crowding is noted, d/t redundant soft palate. Mallampati is class III. Neck size is 17 1/2 inches. Tongue protrudes centrally and palate elevates symmetrically. Tonsils are 1+.   Chest: is clear to auscultation without wheezing, rhonchi or crackles noted.  Heart: sounds are regular and normal without murmurs, rubs or gallops noted.   Abdomen: is soft, non-tender and non-distended with normal bowel sounds appreciated on auscultation.  Extremities: There is no pitting edema in the distal lower extremities bilaterally. Pedal pulses are intact.  Skin: is warm and dry with no trophic changes noted.  Musculoskeletal: exam reveals no obvious joint deformities, tenderness or joint swelling or erythema.  Neurologically:  Mental status: The patient is awake, alert and oriented in all 4 spheres. His memory, attention, language and knowledge are appropriate. There is no aphasia, agnosia, apraxia or anomia. Speech is  clear with normal prosody and enunciation. Thought process is linear. Mood is congruent and affect is normal.  Cranial nerves are as described above under HEENT exam. In addition, shoulder shrug is normal with equal shoulder height noted. Motor exam: Normal bulk, strength and tone is noted. There is no drift, tremor or rebound. Romberg is negative. Reflexes are 2+ throughout. Toes are downgoing bilaterally. Fine motor skills are intact with normal finger taps, normal hand movements, normal rapid alternating patting, normal foot taps and normal foot agility.  Cerebellar testing shows no dysmetria or intention tremor on finger to nose testing. Heel to shin is unremarkable bilaterally. There is no truncal or gait ataxia.  Sensory exam is intact to light touch, pinprick, vibration, temperature  sense and proprioception in the upper and lower extremities.  Gait, station and balance are unremarkable, but he has a mildly antalgic. No veering to one side is noted. No leaning to one side is noted. Posture is age-appropriate and stance is narrow based. No problems turning are noted. He turns en bloc. Tandem walk is unremarkable. Intact toe and heel stance is noted.               Assessment and Plan:   Assessment and Plan:  In summary, Karman Veney is a very pleasant 62 y.o.-year old male with a history of chronic low back pain and hip pain b/l and sleep disruption, d/t snoring, nocturia, pain. His physical exam is concerning for a mildly tight airway and his history of snoring and non-restorative sleep does raise the concern for OSA, obstructive sleep apnea.  I had a long chat with the patient about my findings and the diagnosis of OSA, the prognosis and treatment options. We talked about medical treatments and non-pharmacological approaches. We talked about maintaining a healthy lifestyle in general. As far as further diagnostic testing is concerned, I suggested the following today: I will order a sleep study based  on the HST, which I will have to review. I will call the patient and order the sleep study later once I have had a chance to review his home sleep test report which we are awaiting from your office.Marland Kitchen He was in agreement. If he had an abnormal home sleep test we may be able to bring him back for CPAP titration. If his home sleep study was non-conclusive we may be able to bring him back for a split-night study or baseline sleep study. He understands and voiced agreement. Thank you very much for allowing me to participate in the care of this nice patient. If I can be of any further assistance to you please do not hesitate to call me at [phone].  Sincerely,   Matthew Foley, MD, PhD

## 2012-11-15 NOTE — Patient Instructions (Addendum)
Based on your symptoms and your exam I believe you are at risk for obstructive sleep apnea or OSA, and I think we should proceed with a sleep study if your Home sleep test was abnormal. I will have to wait and see what it showed. If you have more than mild OSA, I want you to consider treatment with CPAP. Please remember, the risks and ramifications of moderate to severe obstructive sleep apnea or OSA are: Cardiovascular disease, including congestive heart failure, stroke, difficult to control hypertension, arrhythmias, and even type 2 diabetes has been linked to untreated OSA. Sleep apnea causes disruption of sleep and sleep deprivation in most cases, which, in turn, can cause recurrent headaches, problems with memory, mood, concentration, focus, and vigilance. Most people with untreated sleep apnea report excessive daytime sleepiness, which can affect their ability to drive. Please do not drive if you feel sleepy.

## 2014-11-24 ENCOUNTER — Other Ambulatory Visit: Payer: Self-pay | Admitting: Gastroenterology

## 2016-07-15 ENCOUNTER — Ambulatory Visit: Payer: Self-pay

## 2016-12-07 ENCOUNTER — Ambulatory Visit (INDEPENDENT_AMBULATORY_CARE_PROVIDER_SITE_OTHER): Payer: BLUE CROSS/BLUE SHIELD

## 2016-12-07 ENCOUNTER — Encounter: Payer: Self-pay | Admitting: Family Medicine

## 2016-12-07 ENCOUNTER — Ambulatory Visit (INDEPENDENT_AMBULATORY_CARE_PROVIDER_SITE_OTHER): Payer: BLUE CROSS/BLUE SHIELD | Admitting: Family Medicine

## 2016-12-07 VITALS — BP 122/64 | HR 65 | Temp 99.7°F | Resp 20 | Ht 67.32 in | Wt 207.2 lb

## 2016-12-07 DIAGNOSIS — J069 Acute upper respiratory infection, unspecified: Secondary | ICD-10-CM

## 2016-12-07 DIAGNOSIS — R509 Fever, unspecified: Secondary | ICD-10-CM

## 2016-12-07 DIAGNOSIS — R05 Cough: Secondary | ICD-10-CM | POA: Diagnosis not present

## 2016-12-07 DIAGNOSIS — R059 Cough, unspecified: Secondary | ICD-10-CM

## 2016-12-07 MED ORDER — AMOXICILLIN 875 MG PO TABS: 875.0000 mg | ORAL_TABLET | Freq: Two times a day (BID) | ORAL | 0 refills | Status: DC

## 2016-12-07 NOTE — Progress Notes (Signed)
Matthew [name] is a 66 y.o. male who presents to Primary Care at [facility] today for cough and fever:  1.  Cough and fever:  Present for past week or so.  Also with some nasal congestion and Left ear pain.  Started with URI symptoms.  Feels pressure around eyes/sinuses.  Cough is productive of thick green sputum.  Nasal discharge the same when he blows his nose.  Subjective fevers with chills.  No recorded temperatures.  Eating and drinking well.  No N/V.  No sick contacts.    Hasn't tried any OTCs for relief.    Fam Hx:  Denies any family history of acute lung issues or asthma.   ROS as above.    PMH reviewed. Patient is a nonsmoker.   Past Medical History:  Diagnosis Date  . Arthritis   . Cancer ([place]) 02/11/2005   prostate cancer, gleason 3+3=6, vol 30 cc  . Chronic back pain 11/15/2012  . Gout   . History of shingles   . Hx of radiation therapy 08/04/05 to 09/22/2005   prostate bed  . Hypercholesteremia   . Hypertension   . Sleep disturbance 11/15/2012  . Snoring 11/15/2012   Past Surgical History:  Procedure Laterality Date  . EYE SURGERY    . HERNIA REPAIR  05/23/12   RIH  . NECK SURGERY    . PROSTATECTOMY  03/2005   Gleason 3+4=7    Medications reviewed. Current Outpatient Prescriptions  Medication Sig Dispense Refill  . acetaminophen-codeine (TYLENOL #3) 300-30 MG per tablet     . amitriptyline (ELAVIL) 25 MG tablet Take 25 mg by mouth at bedtime.    Marland Kitchen amLODipine (NORVASC) 5 MG tablet     . amLODipine-valsartan (EXFORGE) 5-160 MG per tablet Take 1 tablet by mouth daily.    . bicalutamide (CASODEX) 50 MG tablet Take 50 mg by mouth daily.    . fenofibrate 160 MG tablet Take 160 mg by mouth daily.    . finasteride (PROSCAR) 5 MG tablet Take 5 mg by mouth daily.    Marland Kitchen HYDROcodone-acetaminophen (VICODIN) 5-500 MG per tablet Take 1 tablet by mouth every 6 (six) hours as needed.    Marland Kitchen HYDROMET 5-1.5 MG/5ML syrup      No current facility-administered medications for this visit.        Physical Exam:  BP 122/64 (BP Location: Right Arm, Patient Position: Sitting, Cuff Size: Large)   Pulse 65   Temp 99.7 F (37.6 C) (Oral)   Resp 20   Ht 5' 7.32" (1.71 m)   Wt 207 lb 3.2 oz (94 kg)   SpO2 96%   BMI 32.14 kg/m  Gen:  Patient sitting on exam table, appears stated age in no acute distress Head: Normocephalic atraumatic Eyes: EOMI, PERRL, sclera and conjunctiva non-erythematous Ears:  Canals clear bilaterally.  TMs pearly gray bilaterally without erythema or bulging.   Nose:  Nasal turbinates grossly enlarged bilaterally. Some exudates noted. Tender to palpation of maxillary sinus  Mouth: Mucosa membranes moist. Tonsils +2, nonenlarged, non-erythematous. Neck: No cervical lymphadenopathy noted Heart:  RRR, no murmurs auscultated. Pulm:  Some mild wheezing LLL base that persists despite coughin.    Assessment and Plan:  1.  URI: Concern also for possible sinusitis based on symptoms. Radiographs negative for any lung involvement/PNA.  No history of asthma -- adventitious lung sounds likely transitory.   Plan to treat due to length of symptoms and no improvement.   Will prescribe amoxicillin x 7 days to  cover for any sinusitis.  Instructed patient to return in 1 week for checkup or sooner if worsening or no improvement.

## 2016-12-07 NOTE — Patient Instructions (Addendum)
Your chest xray didn't show any signs of pneumonia.    This is good news.  Take the Amoxicillin twice daily for the next 7 days.  This should help you get all cleared up.    Come back to see [place] if you start having any worsening symptoms despite treatment.      IF you received an x-ray today, you will receive an invoice from [facility]. Please contact [facility] at [phone] with questions or concerns regarding your invoice.   IF you received labwork today, you will receive an invoice from [facility]. Please contact [facility] at [phone] with questions or concerns regarding your invoice.   Our billing staff will not be able to assist you with questions regarding bills from these companies.  You will be contacted with the lab results as soon as they are available. The fastest way to get your results is to activate your My Chart account. Instructions are located on the last page of this paperwork. If you have not heard from [place] regarding the results in 2 weeks, please contact this office.

## 2017-03-21 DIAGNOSIS — Z23 Encounter for immunization: Secondary | ICD-10-CM | POA: Diagnosis not present

## 2017-04-10 DIAGNOSIS — E785 Hyperlipidemia, unspecified: Secondary | ICD-10-CM | POA: Diagnosis not present

## 2017-04-10 DIAGNOSIS — Z79899 Other long term (current) drug therapy: Secondary | ICD-10-CM | POA: Diagnosis not present

## 2017-04-24 DIAGNOSIS — Z79891 Long term (current) use of opiate analgesic: Secondary | ICD-10-CM | POA: Diagnosis not present

## 2017-04-24 DIAGNOSIS — M47817 Spondylosis without myelopathy or radiculopathy, lumbosacral region: Secondary | ICD-10-CM | POA: Diagnosis not present

## 2017-04-24 DIAGNOSIS — M7551 Bursitis of right shoulder: Secondary | ICD-10-CM | POA: Diagnosis not present

## 2017-04-24 DIAGNOSIS — M5137 Other intervertebral disc degeneration, lumbosacral region: Secondary | ICD-10-CM | POA: Diagnosis not present

## 2017-04-24 DIAGNOSIS — M461 Sacroiliitis, not elsewhere classified: Secondary | ICD-10-CM | POA: Diagnosis not present

## 2017-04-24 DIAGNOSIS — G894 Chronic pain syndrome: Secondary | ICD-10-CM | POA: Diagnosis not present

## 2017-04-24 DIAGNOSIS — M62830 Muscle spasm of back: Secondary | ICD-10-CM | POA: Diagnosis not present

## 2017-05-01 ENCOUNTER — Ambulatory Visit (INDEPENDENT_AMBULATORY_CARE_PROVIDER_SITE_OTHER): Payer: Medicare Other | Admitting: Family Medicine

## 2017-05-01 ENCOUNTER — Encounter: Payer: Self-pay | Admitting: Family Medicine

## 2017-05-01 VITALS — BP 146/72 | HR 79 | Temp 98.2°F | Resp 18 | Ht 67.32 in | Wt 211.0 lb

## 2017-05-01 DIAGNOSIS — B029 Zoster without complications: Secondary | ICD-10-CM | POA: Diagnosis not present

## 2017-05-01 MED ORDER — VALACYCLOVIR HCL 1 G PO TABS: 1000.0000 mg | ORAL_TABLET | Freq: Three times a day (TID) | ORAL | 0 refills | Status: DC

## 2017-05-01 MED ORDER — GABAPENTIN 100 MG PO CAPS: 100.0000 mg | ORAL_CAPSULE | Freq: Three times a day (TID) | ORAL | 0 refills | Status: DC

## 2017-05-01 NOTE — Progress Notes (Signed)
10/8/201812:49 PM  Matthew [name] 19-Aug-1950, 66 y.o. male [id number]  Chief Complaint  Patient presents with  . Pain    pain feels like needles in left back shoulder to arm and pt believes it is shingles needs to have an ice pack on it at all times x1day     HPI:   Patient is a 66 y.o. male who presents today with concerns of recurring shingles. He describes that lats night he started pin and needles, burning pain on his left upper back and arm. He states this reminds him of previous shingles he had years ago, same location. Ice pack helps. Denies any rash.   Depression screen [facility] 2/9 05/01/2017 12/07/2016  Decreased Interest 0 0  Down, Depressed, Hopeless 0 0  PHQ - 2 Score 0 0    No Known Allergies  Prior to Admission medications   Medication Sig Start Date End Date Taking? Authorizing Provider  acetaminophen-codeine (TYLENOL #3) 300-30 MG per tablet  09/17/12  Yes [provider]  amitriptyline (ELAVIL) 25 MG tablet Take 25 mg by mouth at bedtime.   Yes [provider]  amLODipine (NORVASC) 5 MG tablet  06/26/12  Yes [provider]  amLODipine-valsartan (EXFORGE) 5-160 MG per tablet Take 1 tablet by mouth daily.   Yes [provider]  amoxicillin (AMOXIL) 875 MG tablet Take 1 tablet (875 mg total) by mouth 2 (two) times daily. 12/07/16  Yes Alveda Reasons, MD  bicalutamide (CASODEX) 50 MG tablet Take 50 mg by mouth daily.   Yes [provider]  fenofibrate 160 MG tablet Take 160 mg by mouth daily.   Yes Harlan Stains, MD  finasteride (PROSCAR) 5 MG tablet Take 5 mg by mouth daily.   Yes [provider]  HYDROcodone-acetaminophen (VICODIN) 5-500 MG per tablet Take 1 tablet by mouth every 6 (six) hours as needed.   Yes [provider]  HYDROMET 5-1.5 MG/5ML syrup  10/19/12  Yes [provider]    Past Medical History:  Diagnosis Date  . Arthritis   . Cancer ([place]) 02/11/2005   prostate cancer, gleason  3+3=6, vol 30 cc  . Chronic back pain 11/15/2012  . Gout   . History of shingles   . Hx of radiation therapy 08/04/05 to 09/22/2005   prostate bed  . Hypercholesteremia   . Hypertension   . Sleep disturbance 11/15/2012  . Snoring 11/15/2012    Past Surgical History:  Procedure Laterality Date  . EYE SURGERY    . HERNIA REPAIR  05/23/12   RIH  . NECK SURGERY    . PROSTATECTOMY  03/2005   Gleason 3+4=7    Social History  Substance Use Topics  . Smoking status: Never Smoker  . Smokeless tobacco: Current User    Types: Chew     Comment: 76 + yrs started chewing tobacco at 66 yrs old  . Alcohol use No    No family history on file.  Review of Systems  Constitutional: Negative for chills and fever.  Respiratory: Negative for cough and shortness of breath.   Cardiovascular: Negative for chest pain, palpitations and leg swelling.  Gastrointestinal: Negative for abdominal pain, nausea and vomiting.     OBJECTIVE:  Blood pressure (!) 146/72, pulse 79, temperature 98.2 F (36.8 C), temperature source Oral, resp. rate 18, height 5' 7.32" (1.71 m), weight 211 lb (95.7 kg), SpO2 95 %.  Physical Exam  Constitutional: He is oriented to person, place, and time and well-developed, well-nourished,  and in no distress.  HENT:  Head: Normocephalic and atraumatic.  Mouth/Throat: Oropharynx is clear and moist.  Eyes: Pupils are equal, round, and reactive to light. EOM are normal.  Neck: Neck supple.  Cardiovascular: Normal rate and regular rhythm.  Exam reveals no gallop and no friction rub.   No murmur heard. Pulmonary/Chest: Effort normal and breath sounds normal. He has no wheezes. He has no rales.  Neurological: He is alert and oriented to person, place, and time. Gait normal.  Skin: Skin is warm and dry. No rash noted.     ASSESSMENT and PLAN  1. Herpes zoster without complication Will treat empirically based on past outbreak. Discussed meds r/se/b. Discussed routine  precautions.  Other orders - valACYclovir (VALTREX) 1000 MG tablet; Take 1 tablet (1,000 mg total) by mouth 3 (three) times daily. - gabapentin (NEURONTIN) 100 MG capsule; Take 1 capsule (100 mg total) by mouth 3 (three) times daily.  Return if symptoms worsen or fail to improve.    Rutherford Guys, MD Primary Care at [place], [place] [address] Ph.  [phone] Fax [phone]

## 2017-05-01 NOTE — Patient Instructions (Addendum)
IF you received an x-ray today, you will receive an invoice from [facility]. Please contact [facility] at [phone] with questions or concerns regarding your invoice.   IF you received labwork today, you will receive an invoice from [facility]. Please contact [facility] at [phone] with questions or concerns regarding your invoice.   Our billing staff will not be able to assist you with questions regarding bills from these companies.  You will be contacted with the lab results as soon as they are available. The fastest way to get your results is to activate your My Chart account. Instructions are located on the last page of this paperwork. If you have not heard from [place] regarding the results in 2 weeks, please contact this office.     Shingles Shingles, which is also known as herpes zoster, is an infection that causes a painful skin rash and fluid-filled blisters. Shingles is not related to genital herpes, which is a sexually transmitted infection. Shingles only develops in people who:  Have had chickenpox.  Have received the chickenpox vaccine. (This is rare.)  What are the causes? Shingles is caused by varicella-zoster virus (VZV). This is the same virus that causes chickenpox. After exposure to VZV, the virus stays in the body in an inactive (dormant) state. Shingles develops if the virus reactivates. This can happen many years after the initial exposure to VZV. It is not known what causes this virus to reactivate. What increases the risk? People who have had chickenpox or received the chickenpox vaccine are at risk for shingles. Infection is more common in people who:  Are older than age 22.  Have a weakened defense (immune) system, such as those with HIV, AIDS, or cancer.  Are taking medicines that weaken the immune system, such as transplant medicines.  Are under great stress.  What are the signs or symptoms? Early symptoms of this condition include  itching, tingling, and pain in an area on your skin. Pain may be described as burning, stabbing, or throbbing. A few days or weeks after symptoms start, a painful red rash appears, usually on one side of the body in a bandlike or beltlike pattern. The rash eventually turns into fluid-filled blisters that break open, scab over, and dry up in about 2-3 weeks. At any time during the infection, you may also develop:  A fever.  Chills.  A headache.  An upset stomach.  How is this diagnosed? This condition is diagnosed with a skin exam. Sometimes, skin or fluid samples are taken from the blisters before a diagnosis is made. These samples are examined under a microscope or sent to a lab for testing. How is this treated? There is no specific cure for this condition. Your health care provider will probably prescribe medicines to help you manage pain, recover more quickly, and avoid long-term problems. Medicines may include:  Antiviral drugs.  Anti-inflammatory drugs.  Pain medicines.  If the area involved is on your face, you may be referred to a specialist, such as an eye doctor (ophthalmologist) or an ear, nose, and throat (ENT) doctor to help you avoid eye problems, chronic pain, or disability. Follow these instructions at home: Medicines  Take medicines only as directed by your health care provider.  Apply an anti-itch or numbing cream to the affected area as directed by your health care provider. Blister and Rash Care  Take a cool bath or apply cool compresses to the area of the rash or blisters as directed by  your health care provider. This may help with pain and itching.  Keep your rash covered with a loose bandage (dressing). Wear loose-fitting clothing to help ease the pain of material rubbing against the rash.  Keep your rash and blisters clean with mild soap and cool water or as directed by your health care provider.  Check your rash every day for signs of infection. These  include redness, swelling, and pain that lasts or increases.  Do not pick your blisters.  Do not scratch your rash. General instructions  Rest as directed by your health care provider.  Keep all follow-up visits as directed by your health care provider. This is important.  Until your blisters scab over, your infection can cause chickenpox in people who have never had it or been vaccinated against it. To prevent this from happening, avoid contact with other people, especially: ? Babies. ? Pregnant women. ? Children who have eczema. ? Elderly people who have transplants. ? People who have chronic illnesses, such as leukemia or AIDS. Contact a health care provider if:  Your pain is not relieved with prescribed medicines.  Your pain does not get better after the rash heals.  Your rash looks infected. Signs of infection include redness, swelling, and pain that lasts or increases. Get help right away if:  The rash is on your face or nose.  You have facial pain, pain around your eye area, or loss of feeling on one side of your face.  You have ear pain or you have ringing in your ear.  You have loss of taste.  Your condition gets worse. This information is not intended to replace advice given to you by your health care provider. Make sure you discuss any questions you have with your health care provider. Document Released: 07/11/2005 Document Revised: 03/06/2016 Document Reviewed: 05/22/2014 Elsevier Interactive Patient Education  2017 [employer].

## 2017-05-03 ENCOUNTER — Encounter: Payer: Self-pay | Admitting: Emergency Medicine

## 2017-05-03 ENCOUNTER — Telehealth: Payer: Self-pay | Admitting: *Deleted

## 2017-05-03 ENCOUNTER — Ambulatory Visit (INDEPENDENT_AMBULATORY_CARE_PROVIDER_SITE_OTHER): Payer: Medicare Other | Admitting: Emergency Medicine

## 2017-05-03 ENCOUNTER — Ambulatory Visit (INDEPENDENT_AMBULATORY_CARE_PROVIDER_SITE_OTHER): Payer: Medicare Other

## 2017-05-03 VITALS — BP 138/60 | HR 72 | Temp 98.6°F | Resp 16 | Ht 68.0 in | Wt 214.2 lb

## 2017-05-03 DIAGNOSIS — M25512 Pain in left shoulder: Secondary | ICD-10-CM | POA: Diagnosis not present

## 2017-05-03 DIAGNOSIS — Z8546 Personal history of malignant neoplasm of prostate: Secondary | ICD-10-CM | POA: Diagnosis not present

## 2017-05-03 DIAGNOSIS — M25511 Pain in right shoulder: Secondary | ICD-10-CM

## 2017-05-03 MED ORDER — HYDROCODONE-ACETAMINOPHEN 5-325 MG PO TABS: 1.0000 | ORAL_TABLET | Freq: Four times a day (QID) | ORAL | 0 refills | Status: DC | PRN

## 2017-05-03 NOTE — Progress Notes (Signed)
k

## 2017-05-03 NOTE — Addendum Note (Signed)
Addended by: Alfredia Ferguson A on: 05/03/2017 06:25 PM   Modules accepted: Orders

## 2017-05-03 NOTE — Progress Notes (Signed)
Matthew [name] 66 y.o.   Chief Complaint  Patient presents with  . Shoulder Pain    per patient it has gotten worse 05/01/2017 OV -hurts to raise arms    HISTORY OF PRESENT ILLNESS: This is a 66 y.o. male complaining of pain to both shoulders x several days. Pertinent labs & imaging results that were available during my care of the patient were reviewed by me and considered in my medical decision making (see chart for details).  Shoulder Pain   The pain is present in the right shoulder and left shoulder. This is a new problem. There has been no history of extremity trauma. The problem occurs constantly. The problem has been waxing and waning. The quality of the pain is described as aching. The pain is at a severity of 7/10. The pain is severe. Associated symptoms include a limited range of motion and stiffness. Pertinent negatives include no joint locking, joint swelling, numbness or tingling. The symptoms are aggravated by activity. He has tried acetaminophen for the symptoms. The treatment provided no relief.     Prior to Admission medications   Medication Sig Start Date End Date Taking? Authorizing Provider  acetaminophen-codeine (TYLENOL #3) 300-30 MG per tablet  09/17/12   [provider]  amitriptyline (ELAVIL) 25 MG tablet Take 25 mg by mouth at bedtime.    [provider]  amLODipine (NORVASC) 5 MG tablet  06/26/12   [provider]  amLODipine-valsartan (EXFORGE) 5-160 MG per tablet Take 1 tablet by mouth daily.    [provider]  amoxicillin (AMOXIL) 875 MG tablet Take 1 tablet (875 mg total) by mouth 2 (two) times daily. 12/07/16   Alveda Reasons, MD  bicalutamide (CASODEX) 50 MG tablet Take 50 mg by mouth daily.    [provider]  fenofibrate 160 MG tablet Take 160 mg by mouth daily.    Harlan Stains, MD  finasteride (PROSCAR) 5 MG tablet Take 5 mg by mouth daily.    [provider]  gabapentin (NEURONTIN) 100 MG capsule  Take 1 capsule (100 mg total) by mouth 3 (three) times daily. 05/01/17   Rutherford Guys, MD  HYDROcodone-acetaminophen (VICODIN) 5-500 MG per tablet Take 1 tablet by mouth every 6 (six) hours as needed.    [provider]  HYDROMET 5-1.5 MG/5ML syrup  10/19/12   [provider]  valACYclovir (VALTREX) 1000 MG tablet Take 1 tablet (1,000 mg total) by mouth 3 (three) times daily. 05/01/17   Rutherford Guys, MD    No Known Allergies  Patient Active Problem List   Diagnosis Date Noted  . Sleep disturbance 11/15/2012  . Snoring 11/15/2012  . Chronic back pain 11/15/2012  . Right inguinal hernia 04/27/2012  . Cancer ([place]) 02/11/2005    Past Medical History:  Diagnosis Date  . Arthritis   . Cancer ([place]) 02/11/2005   prostate cancer, gleason 3+3=6, vol 30 cc  . Chronic back pain 11/15/2012  . Gout   . History of shingles   . Hx of radiation therapy 08/04/05 to 09/22/2005   prostate bed  . Hypercholesteremia   . Hypertension   . Sleep disturbance 11/15/2012  . Snoring 11/15/2012    Past Surgical History:  Procedure Laterality Date  . EYE SURGERY    . HERNIA REPAIR  05/23/12   RIH  . NECK SURGERY    . PROSTATECTOMY  03/2005   Gleason 3+4=7    Social History   Social History  . Marital status:  Married    Spouse name: N/A  . Number of children: N/A  . Years of education: N/A   Occupational History  . Not on file.   Social History Main Topics  . Smoking status: Never Smoker  . Smokeless tobacco: Current User    Types: Chew     Comment: 19 + yrs started chewing tobacco at 66 yrs old  . Alcohol use No  . Drug use: No  . Sexual activity: Not on file   Other Topics Concern  . Not on file   Social History Narrative   Married, 1 son, pipe fitter    No family history on file.   Review of Systems  Constitutional: Negative.  Negative for chills.  Respiratory: Negative for cough and shortness of breath.   Cardiovascular: Positive for chest pain. Negative  for claudication.  Gastrointestinal: Negative for abdominal pain, diarrhea, nausea and vomiting.  Genitourinary: Negative for dysuria and hematuria.  Musculoskeletal: Positive for stiffness.  Skin: Negative for rash.  Neurological: Negative.  Negative for dizziness, tingling, numbness and headaches.  Endo/Heme/Allergies: Negative.   All other systems reviewed and are negative.   Vitals:   05/03/17 1638  BP: 138/60  Pulse: 72  Resp: 16  Temp: 98.6 F (37 C)  SpO2: 96%    Physical Exam  Constitutional: He is oriented to person, place, and time. He appears well-developed and well-nourished.  HENT:  Head: Normocephalic and atraumatic.  Nose: Nose normal.  Mouth/Throat: Oropharynx is clear and moist.  Eyes: Pupils are equal, round, and reactive to light. Conjunctivae and EOM are normal.  Neck: Normal range of motion. Neck supple.  Cardiovascular: Normal rate, regular rhythm and normal heart sounds.   Pulmonary/Chest: Effort normal and breath sounds normal. He exhibits no tenderness.  Abdominal: Soft. He exhibits no distension.  Musculoskeletal:  Right shoulder: palpable mass to triceps area; LROM Left shoulder: LROM  Neurological: He is alert and oriented to person, place, and time. He displays normal reflexes. No sensory deficit. He exhibits normal muscle tone.  Skin: Skin is warm and dry. Capillary refill takes less than 2 seconds. No rash noted.  No rash; no signs of herpes zoster.  Psychiatric: He has a normal mood and affect. His behavior is normal.  Vitals reviewed. EKG indication: chest pain. NSR with VR65 No acute ischemic changes PR.204 QRS.65  No old EKG to compare with.   ASSESSMENT & PLAN: [name] was seen today for shoulder pain.  Diagnoses and all orders for this visit:  Pain of both shoulder joints -     EKG 12-Lead -     Cancel: DG Shoulder Left; Future -     DG Shoulder Right; Future -     HYDROcodone-acetaminophen (NORCO) 5-325 MG tablet; Take 1 tablet  by mouth every 6 (six) hours as needed for moderate pain.  History of prostate cancer     Patient Instructions  Shoulder Pain Many things can cause shoulder pain, including:  An injury.  Moving the arm in the same way again and again (overuse).  Joint pain (arthritis).  Follow these instructions at home: Take these actions to help with your pain:  Squeeze a soft ball or a foam pad as much as you can. This helps to prevent swelling. It also makes the arm stronger.  Take over-the-counter and prescription medicines only as told by your doctor.  If told, put ice on the area: ? Put ice in a plastic bag. ? Place a towel between your skin  and the bag. ? Leave the ice on for 20 minutes, 2-3 times per day. Stop putting on ice if it does not help with the pain.  If you were given a shoulder sling or immobilizer: ? Wear it as told. ? Remove it to shower or bathe. ? Move your arm as little as possible. ? Keep your hand moving. This helps prevent swelling.  Contact a doctor if:  Your pain gets worse.  Medicine does not help your pain.  You have new pain in your arm, hand, or fingers. Get help right away if:  Your arm, hand, or fingers: ? Tingle. ? Are numb. ? Are swollen. ? Are painful. ? Turn white or blue. This information is not intended to replace advice given to you by your health care provider. Make sure you discuss any questions you have with your health care provider. Document Released: 12/28/2007 Document Revised: 03/06/2016 Document Reviewed: 11/03/2014 Elsevier Interactive Patient Education  2018 [employer].      Agustina Caroli, MD [facility]

## 2017-05-03 NOTE — Patient Instructions (Signed)
Shoulder Pain Many things can cause shoulder pain, including:  An injury.  Moving the arm in the same way again and again (overuse).  Joint pain (arthritis).  Follow these instructions at home: Take these actions to help with your pain:  Squeeze a soft ball or a foam pad as much as you can. This helps to prevent swelling. It also makes the arm stronger.  Take over-the-counter and prescription medicines only as told by your doctor.  If told, put ice on the area: ? Put ice in a plastic bag. ? Place a towel between your skin and the bag. ? Leave the ice on for 20 minutes, 2-3 times per day. Stop putting on ice if it does not help with the pain.  If you were given a shoulder sling or immobilizer: ? Wear it as told. ? Remove it to shower or bathe. ? Move your arm as little as possible. ? Keep your hand moving. This helps prevent swelling.  Contact a doctor if:  Your pain gets worse.  Medicine does not help your pain.  You have new pain in your arm, hand, or fingers. Get help right away if:  Your arm, hand, or fingers: ? Tingle. ? Are numb. ? Are swollen. ? Are painful. ? Turn white or blue. This information is not intended to replace advice given to you by your health care provider. Make sure you discuss any questions you have with your health care provider. Document Released: 12/28/2007 Document Revised: 03/06/2016 Document Reviewed: 11/03/2014 Elsevier Interactive Patient Education  2018 [employer] Inc[employer]  

## 2017-05-03 NOTE — Telephone Encounter (Signed)
Pharmacist Adonis Huguenin called from [facility] stating patient has fill Rx recently for 120 tablets prescribed by Dr Greta Doom. [facility] and [facility] is under signed contract there. I advised Dr Mitchel Honour and the Rx of Hydrocodone 5-325 was given to patient. I spoke to Adonis Huguenin (pharmacist) at [facility] and told her to cancel Rx.

## 2017-05-04 DIAGNOSIS — M7541 Impingement syndrome of right shoulder: Secondary | ICD-10-CM | POA: Diagnosis not present

## 2017-05-04 DIAGNOSIS — M5412 Radiculopathy, cervical region: Secondary | ICD-10-CM | POA: Diagnosis not present

## 2017-05-10 ENCOUNTER — Other Ambulatory Visit: Payer: Self-pay | Admitting: Family Medicine

## 2017-05-22 DIAGNOSIS — M7541 Impingement syndrome of right shoulder: Secondary | ICD-10-CM | POA: Diagnosis not present

## 2017-05-22 DIAGNOSIS — M5412 Radiculopathy, cervical region: Secondary | ICD-10-CM | POA: Diagnosis not present

## 2017-05-26 ENCOUNTER — Other Ambulatory Visit: Payer: Self-pay

## 2017-05-26 MED ORDER — GABAPENTIN 100 MG PO CAPS: 100.0000 mg | ORAL_CAPSULE | Freq: Three times a day (TID) | ORAL | 0 refills | Status: DC

## 2017-05-28 ENCOUNTER — Other Ambulatory Visit: Payer: Self-pay | Admitting: Family Medicine

## 2017-06-21 DIAGNOSIS — C61 Malignant neoplasm of prostate: Secondary | ICD-10-CM | POA: Diagnosis not present

## 2017-06-22 ENCOUNTER — Encounter: Payer: Self-pay | Admitting: Physician Assistant

## 2017-06-22 ENCOUNTER — Other Ambulatory Visit: Payer: Self-pay

## 2017-06-22 ENCOUNTER — Ambulatory Visit (INDEPENDENT_AMBULATORY_CARE_PROVIDER_SITE_OTHER): Payer: Medicare Other | Admitting: Physician Assistant

## 2017-06-22 VITALS — BP 146/68 | HR 73 | Temp 98.1°F | Resp 18 | Ht 68.2 in | Wt 212.4 lb

## 2017-06-22 DIAGNOSIS — M5137 Other intervertebral disc degeneration, lumbosacral region: Secondary | ICD-10-CM | POA: Diagnosis not present

## 2017-06-22 DIAGNOSIS — I10 Essential (primary) hypertension: Secondary | ICD-10-CM | POA: Diagnosis not present

## 2017-06-22 DIAGNOSIS — E785 Hyperlipidemia, unspecified: Secondary | ICD-10-CM | POA: Diagnosis not present

## 2017-06-22 DIAGNOSIS — M47817 Spondylosis without myelopathy or radiculopathy, lumbosacral region: Secondary | ICD-10-CM | POA: Diagnosis not present

## 2017-06-22 DIAGNOSIS — G894 Chronic pain syndrome: Secondary | ICD-10-CM | POA: Diagnosis not present

## 2017-06-22 DIAGNOSIS — R748 Abnormal levels of other serum enzymes: Secondary | ICD-10-CM

## 2017-06-22 DIAGNOSIS — M461 Sacroiliitis, not elsewhere classified: Secondary | ICD-10-CM | POA: Diagnosis not present

## 2017-06-22 LAB — POCT URINALYSIS DIP (MANUAL ENTRY)
Glucose, UA: NEGATIVE mg/dL
Leukocytes, UA: NEGATIVE
Nitrite, UA: NEGATIVE
RBC UA: NEGATIVE
UROBILINOGEN UA: 0.2 U/dL
pH, UA: 5.5 (ref 5.0–8.0)

## 2017-06-22 NOTE — Progress Notes (Signed)
06/22/2017 3:33 PM   DOB: 01-21-1951 / MRN: [id number]  SUBJECTIVE:  SUBJECTIVE:  Matthew [name]  is a 66 y.o. male presenting to establish care.  Had been seeing [employer].   History of prostate cancer 11 years ago and Dr. Mar Daring and tells me "this is all cleared up." He values his therapeutic relationship with Dr. Mar Daring.   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 Sees pain management at [facility] with Corinna Capra.  Also taking flexeril.   Has a history of elevated cholesterol and HTN which has been high "forever" and "no one has been able to fix it." Takes 10 of norvasc daily and is currently taking Bystolic 5 mg.    Never smoker. Does chew tobacco.   Has had cataract surgery with Doctor Bing Plume.   He has No Known Allergies.   He  has a past medical history of Arthritis.     He has No Known Allergies.   He  has a past medical history of Arthritis, Cancer ([place]) (02/11/2005), Chronic back pain (11/15/2012), Gout, History of shingles, radiation therapy (08/04/05 to 09/22/2005), Hypercholesteremia, Hypertension, Sleep disturbance (11/15/2012), and Snoring (11/15/2012).    He  reports that  has never smoked. His smokeless tobacco use includes chew. He reports that he does not drink alcohol or use drugs. He  has no sexual activity history on file. The patient  has a past surgical history that includes Prostatectomy (03/2005); Neck surgery; Hernia repair (05/23/12); and Eye surgery.  His family history is not on file.  Review of Systems  Constitutional: Negative for chills, diaphoresis and fever.  Eyes: Negative.   Respiratory: Negative for cough, hemoptysis, sputum production, shortness of breath and wheezing.   Cardiovascular: Negative for chest pain, orthopnea and leg swelling.  Gastrointestinal: Negative for nausea.  Skin: Negative for rash.  Neurological: Negative for dizziness, sensory  change, speech change, focal weakness and headaches.    The problem list and medications were reviewed and updated by myself where necessary and exist elsewhere in the encounter.   OBJECTIVE:  BP (!) 146/68 (BP Location: Right Arm, Patient Position: Sitting, Cuff Size: Large)   Pulse 73   Temp 98.1 F (36.7 C) (Oral)   Resp 18   Ht 5' 8.2" (1.732 m)   Wt 212 lb 6.4 oz (96.3 kg)   SpO2 97%   BMI 32.11 kg/m    Physical Exam  Constitutional: He appears well-developed and well-nourished. He is active and cooperative.  Non-toxic appearance. No distress.  Cardiovascular: Normal rate, regular rhythm and normal heart sounds.  Pulmonary/Chest: Effort normal. No tachypnea.  Musculoskeletal: Normal range of motion.  Neurological: He is alert.  Skin: Skin is warm and dry. He is not diaphoretic. No pallor.  Vitals reviewed.   Results for orders placed or performed in visit on 06/22/17 (from the past 72 hour(s))  POCT urinalysis dipstick     Status: Abnormal   Collection Time: 06/22/17  3:11 PM  Result Value Ref Range   Color, UA brown (A) yellow   Clarity, UA clear clear   Glucose, UA negative negative mg/dL   Bilirubin, UA small (A) negative   Ketones, POC UA small (15) (A) negative mg/dL   Spec Grav, UA >=1.030 (A) 1.010 - 1.025   Blood, UA negative negative   pH, UA 5.5 5.0 - 8.0   Protein Ur, POC trace (A) negative mg/dL  Urobilinogen, UA 0.2 0.2 or 1.0 E.U./dL   Nitrite, UA Negative Negative   Leukocytes, UA Negative Negative    No results found.  ASSESSMENT AND PLAN:  Matthew [name] was seen today for establish care.  Diagnoses and all orders for this visit:  Dyslipidemia -     Lipid panel  Hypertension, unspecified type -     TSH -     CBC -     POCT urinalysis dipstick -     CMP and Liver    The patient is advised to call or return to clinic if he does not see an improvement in symptoms, or to seek the care of the closest emergency department if he worsens with  the above plan.   Philis Fendt, MHS, PA-C [facility] 06/22/2017 3:33 PM

## 2017-06-22 NOTE — Patient Instructions (Addendum)
  Please bring your vaccination records to our office in about 1 month.  I will call you with recommendations regarding medication.  Please bring your entire medical record from Dr. Orest Dikes office as well.    IF you received an x-ray today, you will receive an invoice from [facility]. Please contact [facility] at [phone] with questions or concerns regarding your invoice.   IF you received labwork today, you will receive an invoice from [place]. Please contact [facility] at [phone] with questions or concerns regarding your invoice.   Our billing staff will not be able to assist you with questions regarding bills from these companies.  You will be contacted with the lab results as soon as they are available. The fastest way to get your results is to activate your My Chart account. Instructions are located on the last page of this paperwork. If you have not heard from [place] regarding the results in 2 weeks, please contact this office.

## 2017-06-23 LAB — CMP AND LIVER
ALBUMIN: 5.3 g/dL — AB (ref 3.6–4.8)
ALK PHOS: 71 IU/L (ref 39–117)
ALT: 25 IU/L (ref 0–44)
AST: 24 IU/L (ref 0–40)
BILIRUBIN TOTAL: 0.7 mg/dL (ref 0.0–1.2)
BUN: 25 mg/dL (ref 8–27)
Bilirubin, Direct: 0.18 mg/dL (ref 0.00–0.40)
CHLORIDE: 97 mmol/L (ref 96–106)
CO2: 28 mmol/L (ref 20–29)
Calcium: 10.1 mg/dL (ref 8.6–10.2)
Creatinine, Ser: 1.57 mg/dL — ABNORMAL HIGH (ref 0.76–1.27)
GFR calc Af Amer: 52 mL/min/{1.73_m2} — ABNORMAL LOW (ref >59)
GFR, EST NON AFRICAN AMERICAN: 45 mL/min/{1.73_m2} — AB (ref >59)
Glucose: 101 mg/dL — ABNORMAL HIGH (ref 65–99)
POTASSIUM: 4.2 mmol/L (ref 3.5–5.2)
SODIUM: 142 mmol/L (ref 134–144)
TOTAL PROTEIN: 8 g/dL (ref 6.0–8.5)

## 2017-06-23 LAB — LIPID PANEL
CHOLESTEROL TOTAL: 175 mg/dL (ref 100–199)
Chol/HDL Ratio: 3.8 ratio (ref 0.0–5.0)
HDL: 46 mg/dL (ref >39)
LDL Calculated: 82 mg/dL (ref 0–99)
TRIGLYCERIDES: 234 mg/dL — AB (ref 0–149)
VLDL Cholesterol Cal: 47 mg/dL — ABNORMAL HIGH (ref 5–40)

## 2017-06-23 LAB — CBC
HEMOGLOBIN: 15.6 g/dL (ref 13.0–17.7)
Hematocrit: 43.8 % (ref 37.5–51.0)
MCH: 31.3 pg (ref 26.6–33.0)
MCHC: 35.6 g/dL (ref 31.5–35.7)
MCV: 88 fL (ref 79–97)
Platelets: 202 10*3/uL (ref 150–379)
RBC: 4.99 x10E6/uL (ref 4.14–5.80)
RDW: 15.5 % — ABNORMAL HIGH (ref 12.3–15.4)
WBC: 8.8 10*3/uL (ref 3.4–10.8)

## 2017-06-23 LAB — TSH: TSH: 1.33 u[IU]/mL (ref 0.450–4.500)

## 2017-06-26 ENCOUNTER — Telehealth: Payer: Self-pay | Admitting: Physician Assistant

## 2017-06-26 NOTE — Addendum Note (Signed)
Addended by: Gari Crown D on: 06/26/2017 04:11 PM   Modules accepted: Orders

## 2017-06-26 NOTE — Addendum Note (Signed)
Addended by: Gari Crown D on: 06/26/2017 04:10 PM   Modules accepted: Orders

## 2017-06-26 NOTE — Addendum Note (Signed)
Addended by: Gari Crown D on: 06/26/2017 10:12 AM   Modules accepted: Orders

## 2017-06-26 NOTE — Telephone Encounter (Signed)
Called patient to come back in for additional blood work ordered by Philis Fendt. I spoke to his wife and left a message for him to call back to set up a day and time for a LAB ONLY Visit.  SJ 06-26-17

## 2017-06-27 ENCOUNTER — Ambulatory Visit: Payer: Medicare Other | Admitting: Emergency Medicine

## 2017-06-27 DIAGNOSIS — R748 Abnormal levels of other serum enzymes: Secondary | ICD-10-CM

## 2017-06-28 DIAGNOSIS — N5231 Erectile dysfunction following radical prostatectomy: Secondary | ICD-10-CM | POA: Diagnosis not present

## 2017-06-28 DIAGNOSIS — N3281 Overactive bladder: Secondary | ICD-10-CM | POA: Diagnosis not present

## 2017-06-28 DIAGNOSIS — C61 Malignant neoplasm of prostate: Secondary | ICD-10-CM | POA: Diagnosis not present

## 2017-06-28 DIAGNOSIS — R351 Nocturia: Secondary | ICD-10-CM | POA: Diagnosis not present

## 2017-06-28 LAB — RENAL FUNCTION PANEL
ALBUMIN: 4.7 g/dL (ref 3.6–4.8)
BUN/Creatinine Ratio: 15 (ref 10–24)
BUN: 17 mg/dL (ref 8–27)
CALCIUM: 9.5 mg/dL (ref 8.6–10.2)
CHLORIDE: 102 mmol/L (ref 96–106)
CO2: 27 mmol/L (ref 20–29)
CREATININE: 1.14 mg/dL (ref 0.76–1.27)
GFR calc Af Amer: 77 mL/min/{1.73_m2} (ref >59)
GFR calc non Af Amer: 66 mL/min/{1.73_m2} (ref >59)
Glucose: 121 mg/dL — ABNORMAL HIGH (ref 65–99)
Phosphorus: 3.6 mg/dL (ref 2.5–4.5)
Potassium: 5 mmol/L (ref 3.5–5.2)
Sodium: 141 mmol/L (ref 134–144)

## 2017-07-10 ENCOUNTER — Encounter: Payer: Self-pay | Admitting: Physician Assistant

## 2017-07-10 ENCOUNTER — Other Ambulatory Visit: Payer: Self-pay

## 2017-07-10 ENCOUNTER — Ambulatory Visit (INDEPENDENT_AMBULATORY_CARE_PROVIDER_SITE_OTHER): Payer: Medicare Other | Admitting: Physician Assistant

## 2017-07-10 DIAGNOSIS — I10 Essential (primary) hypertension: Secondary | ICD-10-CM | POA: Diagnosis not present

## 2017-07-10 NOTE — Patient Instructions (Addendum)
     IF you received an x-ray today, you will receive an invoice from [facility]. Please contact [facility] at [phone] with questions or concerns regarding your invoice.   IF you received labwork today, you will receive an invoice from [facility]. Please contact [facility] at [phone] with questions or concerns regarding your invoice.   Our billing staff will not be able to assist you with questions regarding bills from these companies.  You will be contacted with the lab results as soon as they are available. The fastest way to get your results is to activate your My Chart account. Instructions are located on the last page of this paperwork. If you have not heard from us regarding the results in 2 weeks, please contact this office.     

## 2017-07-10 NOTE — Progress Notes (Signed)
    07/10/2017 9:03 AM   DOB: 1950-10-13 / MRN: [id number]  SUBJECTIVE:  Matthew [name] is a 66 y.o. male presenting for BP recheck and to discuss his labs.  He needs no refills at this time.  Was told that he is in stage three kidney failure at one time.   He has No Known Allergies.   He  has a past medical history of Arthritis, Cancer ([place]) (02/11/2005), Chronic back pain (11/15/2012), Gout, History of shingles, radiation therapy (08/04/05 to 09/22/2005), Hypercholesteremia, Hypertension, Sleep disturbance (11/15/2012), and Snoring (11/15/2012).    He  reports that  has never smoked. His smokeless tobacco use includes chew. He reports that he does not drink alcohol or use drugs. He  has no sexual activity history on file. The patient  has a past surgical history that includes Prostatectomy (03/2005); Neck surgery; Hernia repair (05/23/12); and Eye surgery.  His family history is not on file.  Review of Systems  Constitutional: Negative for chills, diaphoresis and fever.  Respiratory: Negative for cough, hemoptysis, sputum production, shortness of breath and wheezing.   Cardiovascular: Negative for chest pain, orthopnea and leg swelling.  Gastrointestinal: Negative for nausea.  Skin: Negative for rash.  Neurological: Negative for dizziness.    The problem list and medications were reviewed and updated by myself where necessary and exist elsewhere in the encounter.   OBJECTIVE:  BP (!) 146/72 (BP Location: Right Arm, Patient Position: Sitting, Cuff Size: Large)   Pulse 72   Temp 97.8 F (36.6 C) (Oral)   Resp 18   Ht 5' 8.2" (1.732 m)   Wt 215 lb (97.5 kg)   SpO2 98%   BMI 32.50 kg/m   BP Readings from Last 3 Encounters:  07/10/17 (!) 146/72  06/22/17 (!) 146/68  05/03/17 138/60     Physical Exam  Constitutional: He appears well-developed. He is active and cooperative.  Non-toxic appearance.  Cardiovascular: Normal rate.  Pulmonary/Chest: Effort normal. No tachypnea.    Neurological: He is alert.  Skin: Skin is warm and dry. He is not diaphoretic. No pallor.  Vitals reviewed.  Lab Results  Component Value Date   CREATININE 1.14 06/27/2017    No results found for this or any previous visit (from the past 72 hour(s)).  No results found.  ASSESSMENT AND PLAN:  Moody was seen today for follow-up.  Diagnoses and all orders for this visit:  Hypertension, unspecified type: His non specific beta blocker is costing him over 200 dollars a quarter.  He would like to stop this medication once it runs out and try a different medication.  Given his near normal renal panel will plan to start him on a dual agent ace diuretic.  He will keep a log of his pressure the week before and after he runs out of his low dose bystolic and present to the office with the log so we can pick out a dose.     The patient is advised to call or return to clinic if he does not see an improvement in symptoms, or to seek the care of the closest emergency department if he worsens with the above plan.   Philis Fendt, MHS, PA-C [facility] 07/10/2017 9:03 AM

## 2017-07-26 DIAGNOSIS — H43393 Other vitreous opacities, bilateral: Secondary | ICD-10-CM | POA: Diagnosis not present

## 2017-07-26 DIAGNOSIS — H5212 Myopia, left eye: Secondary | ICD-10-CM | POA: Diagnosis not present

## 2017-07-26 DIAGNOSIS — H52222 Regular astigmatism, left eye: Secondary | ICD-10-CM | POA: Diagnosis not present

## 2017-07-26 DIAGNOSIS — H52221 Regular astigmatism, right eye: Secondary | ICD-10-CM | POA: Diagnosis not present

## 2017-07-26 DIAGNOSIS — H35033 Hypertensive retinopathy, bilateral: Secondary | ICD-10-CM | POA: Diagnosis not present

## 2017-07-26 DIAGNOSIS — H524 Presbyopia: Secondary | ICD-10-CM | POA: Diagnosis not present

## 2017-08-16 ENCOUNTER — Encounter: Payer: Self-pay | Admitting: Physician Assistant

## 2017-08-16 ENCOUNTER — Other Ambulatory Visit: Payer: Self-pay

## 2017-08-16 ENCOUNTER — Ambulatory Visit (INDEPENDENT_AMBULATORY_CARE_PROVIDER_SITE_OTHER): Payer: Medicare Other | Admitting: Physician Assistant

## 2017-08-16 VITALS — BP 173/69 | HR 58 | Temp 98.0°F | Resp 18 | Ht 68.2 in | Wt 201.0 lb

## 2017-08-16 DIAGNOSIS — E785 Hyperlipidemia, unspecified: Secondary | ICD-10-CM

## 2017-08-16 DIAGNOSIS — I10 Essential (primary) hypertension: Secondary | ICD-10-CM

## 2017-08-16 MED ORDER — ATORVASTATIN CALCIUM 40 MG PO TABS: 40.0000 mg | ORAL_TABLET | Freq: Every day | ORAL | 3 refills | Status: DC

## 2017-08-16 MED ORDER — ATENOLOL 25 MG PO TABS: 25.0000 mg | ORAL_TABLET | Freq: Every day | ORAL | 3 refills | Status: DC

## 2017-08-16 MED ORDER — AMLODIPINE BESYLATE 10 MG PO TABS
10.0000 mg | ORAL_TABLET | Freq: Every day | ORAL | 3 refills | Status: DC
Start: 2017-08-16 — End: 2018-08-21

## 2017-08-16 NOTE — Patient Instructions (Addendum)
Try to get some exercise every day when you can. Come back and see me about two weeks after starting the new blood pressure med.     IF you received an x-ray today, you will receive an invoice from [facility]. Please contact [facility] at [phone] with questions or concerns regarding your invoice.   IF you received labwork today, you will receive an invoice from [facility]. Please contact [facility] at [phone] with questions or concerns regarding your invoice.   Our billing staff will not be able to assist you with questions regarding bills from these companies.  You will be contacted with the lab results as soon as they are available. The fastest way to get your results is to activate your My Chart account. Instructions are located on the last page of this paperwork. If you have not heard from [place] regarding the results in 2 weeks, please contact this office.

## 2017-08-16 NOTE — Progress Notes (Signed)
08/16/2017 4:28 PM   DOB: [birth year] / MRN: [id number]  SUBJECTIVE:  Matthew [name] is a 67 y.o. male presenting for follow-up of blood pressure, dyslipidemia.  He has not had his Norvasc in the last 3-4 days.  He tells me he simply "ran out."  He denies chest pain shortness of breath leg swelling.  He likes to exercise although has not been most recently due to the cold weather.  He has a history of triglycerides into the 3000s however this was only when he was drinking a sixpack at night.  He was previously prescribed prescription fish oil but has not been taking this for over a year.  His last triglycerides were mildly elevated.  He tells me that his by systolic cost him over $119 the last time that he had this felt and he states that he cannot afford this.  He would like another medication option.  He has about 15 of the diastolic left in the bottle.  He takes chronic pain medication and is managed by the pain clinic, and had a negative sleep study recently.  He has No Known Allergies.   He  has a past medical history of Arthritis, Cancer ([place]) (02/11/2005), Chronic back pain (11/15/2012), Gout, History of shingles, radiation therapy (08/04/05 to 09/22/2005), Hypercholesteremia, Hypertension, Sleep disturbance (11/15/2012), and Snoring (11/15/2012).    He  reports that  has never smoked. His smokeless tobacco use includes chew. He reports that he does not drink alcohol or use drugs. He  has no sexual activity history on file. The patient  has a past surgical history that includes Prostatectomy (03/2005); Neck surgery; Hernia repair (05/23/12); and Eye surgery.  His family history is not on file.  Review of Systems  Constitutional: Negative for chills, diaphoresis and fever.  Eyes: Negative.   Respiratory: Negative for cough, hemoptysis, sputum production, shortness of breath and wheezing.   Cardiovascular: Negative for chest pain, orthopnea and leg swelling.  Gastrointestinal: Negative for abdominal  pain, blood in stool, constipation, diarrhea, heartburn, melena, nausea and vomiting.  Genitourinary: Negative for flank pain.  Skin: Negative for rash.  Neurological: Negative for dizziness, sensory change, speech change, focal weakness and headaches.    The problem list and medications were reviewed and updated by myself where necessary and exist elsewhere in the encounter.   OBJECTIVE:  BP (!) 173/69 (BP Location: Right Arm, Patient Position: Sitting, Cuff Size: Large)   Pulse (!) 58   Temp 98 F (36.7 C) (Oral)   Resp 18   Ht 5' 8.2" (1.732 m)   Wt 201 lb (91.2 kg)   SpO2 97%   BMI 30.38 kg/m   BP Readings from Last 3 Encounters:  08/16/17 (!) 173/69  07/10/17 (!) 146/72  06/22/17 (!) 146/68     Physical Exam  Constitutional: He appears well-developed. He is active and cooperative.  Non-toxic appearance.  Cardiovascular: Normal rate, regular rhythm, S1 normal, S2 normal, normal heart sounds, intact distal pulses and normal pulses. Exam reveals no gallop and no friction rub.  No murmur heard. Pulmonary/Chest: Effort normal. No stridor. No tachypnea. No respiratory distress. He has no wheezes. He has no rales.  Abdominal: He exhibits no distension.  Musculoskeletal: He exhibits no edema.  Neurological: He is alert.  Skin: Skin is warm and dry. He is not diaphoretic. No pallor.  Vitals reviewed.   Lab Results  Component Value Date   CHOL 175 06/22/2017   HDL 46 06/22/2017   LDLCALC 82 06/22/2017  TRIG 234 (H) 06/22/2017   CHOLHDL 3.8 06/22/2017   Lab Results  Component Value Date   CREATININE 1.14 06/27/2017   CREATININE 1.57 (H) 06/22/2017     No results found for this or any previous visit (from the past 72 hour(s)).  No results found.  ASSESSMENT AND PLAN:  Jyair was seen today for hypertension.  Diagnoses and all orders for this visit:  Essential hypertension: Starting an equivalent dose of atenolol at 25 mg which should be roughly equal to 5 by  systolic.  Of note he has no history of MI.  Refilling his Norvasc and given that he has not had this in the last 3-4 days this is most likely why his pressure is elevated today.  He is asymptomatic at this time.  He will come back in about 2 weeks after starting the atenolol, sooner if he has any problems. -     atenolol (TENORMIN) 25 MG tablet; Take 1 tablet (25 mg total) by mouth daily. -     amLODipine (NORVASC) 10 MG tablet; Take 1 tablet (10 mg total) by mouth daily.  Dyslipidemia: Refilling atorvastatin at the current dose.  With regard to his mildly elevated triglycerides he continues to avoid alcohol and I have encouraged him to try to eat more fish, avoid sugary snacks, highly processed carbohydrate, and to get regular exercise when possible. -     atorvastatin (LIPITOR) 40 MG tablet; Take 1 tablet (40 mg total) by mouth daily.    The patient is advised to call or return to clinic if he does not see an improvement in symptoms, or to seek the care of the closest emergency department if he worsens with the above plan.   Philis Fendt, MHS, PA-C [facility] 08/16/2017 4:28 PM

## 2017-08-17 DIAGNOSIS — M5137 Other intervertebral disc degeneration, lumbosacral region: Secondary | ICD-10-CM | POA: Diagnosis not present

## 2017-08-17 DIAGNOSIS — M461 Sacroiliitis, not elsewhere classified: Secondary | ICD-10-CM | POA: Diagnosis not present

## 2017-08-17 DIAGNOSIS — M47817 Spondylosis without myelopathy or radiculopathy, lumbosacral region: Secondary | ICD-10-CM | POA: Diagnosis not present

## 2017-08-17 DIAGNOSIS — G894 Chronic pain syndrome: Secondary | ICD-10-CM | POA: Diagnosis not present

## 2017-09-22 DIAGNOSIS — Z8619 Personal history of other infectious and parasitic diseases: Secondary | ICD-10-CM

## 2017-09-22 HISTORY — DX: Personal history of other infectious and parasitic diseases: Z86.19

## 2017-10-04 ENCOUNTER — Encounter: Payer: Self-pay | Admitting: Physician Assistant

## 2017-10-04 ENCOUNTER — Other Ambulatory Visit: Payer: Self-pay

## 2017-10-04 ENCOUNTER — Ambulatory Visit (INDEPENDENT_AMBULATORY_CARE_PROVIDER_SITE_OTHER): Payer: Medicare Other | Admitting: Physician Assistant

## 2017-10-04 VITALS — BP 113/68 | HR 61 | Temp 98.5°F | Resp 18 | Ht 68.74 in | Wt 215.2 lb

## 2017-10-04 DIAGNOSIS — R1310 Dysphagia, unspecified: Secondary | ICD-10-CM

## 2017-10-04 DIAGNOSIS — A048 Other specified bacterial intestinal infections: Secondary | ICD-10-CM

## 2017-10-04 DIAGNOSIS — R1319 Other dysphagia: Secondary | ICD-10-CM

## 2017-10-04 MED ORDER — FAMOTIDINE 20 MG PO TABS: 20.0000 mg | ORAL_TABLET | Freq: Every evening | ORAL | 0 refills | Status: DC | PRN

## 2017-10-04 NOTE — Progress Notes (Addendum)
10/06/2017 10:23 AM   DOB: [birth year] / MRN: [id number]  SUBJECTIVE:  Matthew [name] is a 67 y.o. male presenting for dysphasia.  This started about 3 weeks ago.  He tells me "I can swallow fine but food gets stuck and I will cough and hack and sometimes it will go down and sometimes it will come up." He associates new onset GERD. He tells me this most often happens in the morning however it could be at any time.  He denies any blood in the stool and also denies abdominal pain.  He is never had this problem before.  Since the onset is not getting worse or better.  He has tried Tums   He has No Known Allergies.   He  has a past medical history of Arthritis, Cancer ([place]) (02/11/2005), Chronic back pain (11/15/2012), Gout, History of shingles, radiation therapy (08/04/05 to 09/22/2005), Hypercholesteremia, Hypertension, Sleep disturbance (11/15/2012), and Snoring (11/15/2012).    He  reports that  has never smoked. His smokeless tobacco use includes chew. He reports that he does not drink alcohol or use drugs. He  has no sexual activity history on file. The patient  has a past surgical history that includes Prostatectomy (03/2005); Neck surgery; Hernia repair (05/23/12); and Eye surgery.  His family history is not on file.  Review of Systems  Constitutional: Negative for chills, diaphoresis and fever.  Respiratory: Negative for cough, hemoptysis, sputum production, shortness of breath and wheezing.   Cardiovascular: Negative for chest pain, orthopnea and leg swelling.  Gastrointestinal: Negative for nausea.  Skin: Negative for rash.  Neurological: Negative for dizziness.    The problem list and medications were reviewed and updated by myself where necessary and exist elsewhere in the encounter.   OBJECTIVE:  BP 113/68 (BP Location: Right Arm, Patient Position: Sitting, Cuff Size: Large)   Pulse 61   Temp 98.5 F (36.9 C) (Oral)   Resp 18   Ht 5' 8.74" (1.746 m)   Wt 215 lb 3.2 oz (97.6 kg)    SpO2 98%   BMI 32.02 kg/m   Wt Readings from Last 3 Encounters:  10/04/17 215 lb 3.2 oz (97.6 kg)  08/16/17 201 lb (91.2 kg)  07/10/17 215 lb (97.5 kg)    BP Readings from Last 3 Encounters:  10/04/17 113/68  08/16/17 (!) 173/69  07/10/17 (!) 146/72      Physical Exam  Constitutional: He appears well-developed. He is active and cooperative.  Non-toxic appearance.  Cardiovascular: Normal rate.  Pulmonary/Chest: Effort normal. No tachypnea.  Abdominal: Soft. Normal appearance and bowel sounds are normal. He exhibits no distension and no mass. There is no tenderness. There is no rigidity, no rebound, no guarding and no CVA tenderness. No hernia.  Neurological: He is alert.  Skin: Skin is warm and dry. He is not diaphoretic. No pallor.  Vitals reviewed.   Results for orders placed or performed in visit on 10/04/17 (from the past 72 hour(s))  H. pylori breath test     Status: Abnormal   Collection Time: 10/04/17  9:59 AM  Result Value Ref Range   H pylori Breath Test Positive (A) Negative      No results found.  ASSESSMENT AND PLAN:  Burlon was seen today for hypertension.  Diagnoses and all orders for this visit:  Esophageal dysphagia -     H. pylori breath test -     Ambulatory referral to Gastroenterology -     famotidine (PEPCID) 20 MG tablet;  Take 1 tablet (20 mg total) by mouth at bedtime as needed for heartburn or indigestion.  Helicobacter pylori infection: Patient made aware via phone call.  Of advised that he stop his Lipitor while he is taking Biaxin and that he can start it back immediately after Biaxin cessation.  He will take the medications exactly as prescribed.  I will see him back in about 20 days after starting the antibiotics. -     amoxicillin (AMOXIL) 500 MG capsule; Take 2 capsules (1,000 mg total) by mouth 2 (two) times daily. -     clarithromycin (BIAXIN) 500 MG tablet; Take 1 tablet (500 mg total) by mouth 2 (two) times daily. TAKE WITH FOOD -      pantoprazole (PROTONIX) 40 MG tablet; Take 1 tablet (40 mg total) by mouth daily. Take thirty minutes before the first meal of the day.    The patient is advised to call or return to clinic if he does not see an improvement in symptoms, or to seek the care of the closest emergency department if he worsens with the above plan.   Philis Fendt, MHS, PA-C [facility] 10/06/2017 10:23 AM

## 2017-10-04 NOTE — Patient Instructions (Addendum)
It was great to see you again today Matthew [name].  I am glad the blood pressure medication is working well for you and not causing you too much money.  For your swallowing problem I am going to start you on a medication that you take at night before bed.  I am testing you for H. pylori which is a bacteria that can be in your stomach which could calls this problem.  I am going to refer you to a GI doctor is I think the H. pylori test will probably come back negative and the symptoms need more evaluation than what I can provide you.  I am sending you to Dr. Carlean Purl who will listen to your complaint and most likely perform a scope.    IF you received an x-ray today, you will receive an invoice from [facility]. Please contact [facility] at [phone] with questions or concerns regarding your invoice.   IF you received labwork today, you will receive an invoice from [facility]. Please contact [facility] at [phone] with questions or concerns regarding your invoice.   Our billing staff will not be able to assist you with questions regarding bills from these companies.  You will be contacted with the lab results as soon as they are available. The fastest way to get your results is to activate your My Chart account. Instructions are located on the last page of this paperwork. If you have not heard from [place] regarding the results in 2 weeks, please contact this office.

## 2017-10-06 LAB — H. PYLORI BREATH TEST: H PYLORI BREATH TEST: POSITIVE — AB

## 2017-10-06 MED ORDER — AMOXICILLIN 500 MG PO CAPS: 1000.0000 mg | ORAL_CAPSULE | Freq: Two times a day (BID) | ORAL | 0 refills | Status: DC

## 2017-10-06 MED ORDER — PANTOPRAZOLE SODIUM 40 MG PO TBEC: 40.0000 mg | DELAYED_RELEASE_TABLET | Freq: Every day | ORAL | 0 refills | Status: DC

## 2017-10-06 MED ORDER — CLARITHROMYCIN 500 MG PO TABS: 500.0000 mg | ORAL_TABLET | Freq: Two times a day (BID) | ORAL | 0 refills | Status: DC

## 2017-10-06 NOTE — Addendum Note (Signed)
Addended by: Tereasa Coop on: 10/06/2017 10:23 AM   Modules accepted: Orders

## 2017-10-12 DIAGNOSIS — M47817 Spondylosis without myelopathy or radiculopathy, lumbosacral region: Secondary | ICD-10-CM | POA: Diagnosis not present

## 2017-10-12 DIAGNOSIS — G894 Chronic pain syndrome: Secondary | ICD-10-CM | POA: Diagnosis not present

## 2017-10-12 DIAGNOSIS — M461 Sacroiliitis, not elsewhere classified: Secondary | ICD-10-CM | POA: Diagnosis not present

## 2017-10-12 DIAGNOSIS — M5137 Other intervertebral disc degeneration, lumbosacral region: Secondary | ICD-10-CM | POA: Diagnosis not present

## 2017-10-26 ENCOUNTER — Other Ambulatory Visit: Payer: Self-pay | Admitting: Physician Assistant

## 2017-10-26 DIAGNOSIS — R1310 Dysphagia, unspecified: Secondary | ICD-10-CM

## 2017-10-26 DIAGNOSIS — R1319 Other dysphagia: Secondary | ICD-10-CM

## 2017-10-26 NOTE — Telephone Encounter (Signed)
Famotidine refill Last OV: 10/04/17 Last Refill:10/04/17 #30 tabs no RF Pharmacy:[facility]. PCP: Philis Fendt PA

## 2017-11-03 ENCOUNTER — Ambulatory Visit (INDEPENDENT_AMBULATORY_CARE_PROVIDER_SITE_OTHER): Payer: Medicare Other | Admitting: Physician Assistant

## 2017-11-03 ENCOUNTER — Encounter: Payer: Self-pay | Admitting: Physician Assistant

## 2017-11-03 ENCOUNTER — Other Ambulatory Visit: Payer: Self-pay

## 2017-11-03 VITALS — BP 130/70 | HR 81 | Temp 98.0°F | Resp 16 | Ht 68.11 in | Wt 209.0 lb

## 2017-11-03 DIAGNOSIS — I10 Essential (primary) hypertension: Secondary | ICD-10-CM | POA: Diagnosis not present

## 2017-11-03 DIAGNOSIS — R1310 Dysphagia, unspecified: Secondary | ICD-10-CM

## 2017-11-03 DIAGNOSIS — R1319 Other dysphagia: Secondary | ICD-10-CM

## 2017-11-03 LAB — POCT CBC
Granulocyte percent: 70.7 %G (ref 37–80)
HEMATOCRIT: 41.1 % — AB (ref 43.5–53.7)
Hemoglobin: 14.1 g/dL (ref 14.1–18.1)
LYMPH, POC: 1.5 (ref 0.6–3.4)
MCH: 30.3 pg (ref 27–31.2)
MCHC: 34.4 g/dL (ref 31.8–35.4)
MCV: 88.2 fL (ref 80–97)
MID (CBC): 0.3 (ref 0–0.9)
MPV: 7.1 fL (ref 0–99.8)
POC GRANULOCYTE: 4.4 (ref 2–6.9)
POC LYMPH PERCENT: 24 %L (ref 10–50)
POC MID %: 5.3 %M (ref 0–12)
Platelet Count, POC: 183 10*3/uL (ref 142–424)
RBC: 4.66 M/uL — AB (ref 4.69–6.13)
RDW, POC: 12.8 %
WBC: 6.2 10*3/uL (ref 4.6–10.2)

## 2017-11-03 MED ORDER — FAMOTIDINE 40 MG PO TABS: 20.0000 mg | ORAL_TABLET | Freq: Every day | ORAL | 0 refills | Status: DC

## 2017-11-03 MED ORDER — PANTOPRAZOLE SODIUM 40 MG PO TBEC: 40.0000 mg | DELAYED_RELEASE_TABLET | Freq: Every day | ORAL | 1 refills | Status: DC

## 2017-11-03 NOTE — Progress Notes (Signed)
11/03/2017 1:51 PM   DOB: [birth year] / MRN: [id number]  SUBJECTIVE:  Matthew [name] is a 67 y.o. male presenting for follow-up of esophageal dysphasia.  Patient was found to have positive H. pylori and has done well post treatment.  He tells me "I had an attack at supper 3 days ago"in which she could not swallow.  Otherwise the patient has been asymptomatic.  He has stopped taking famotidine.  At this time he denies weight loss, dysphagia, odynophagia.  He has No Known Allergies.   He  has a past medical history of Arthritis, Cancer ([place]) (02/11/2005), Chronic back pain (11/15/2012), Gout, History of shingles, radiation therapy (08/04/05 to 09/22/2005), Hypercholesteremia, Hypertension, Sleep disturbance (11/15/2012), and Snoring (11/15/2012).    He  reports that he has never smoked. His smokeless tobacco use includes chew. He reports that he does not drink alcohol or use drugs. He  has no sexual activity history on file. The patient  has a past surgical history that includes Prostatectomy (03/2005); Neck surgery; Hernia repair (05/23/12); and Eye surgery.  His family history is not on file.  Review of Systems  Constitutional: Negative for chills, diaphoresis and fever.  Respiratory: Negative for cough, hemoptysis, sputum production, shortness of breath and wheezing.   Cardiovascular: Negative for chest pain, orthopnea and leg swelling.  Gastrointestinal: Negative for abdominal pain, heartburn, nausea and vomiting.  Skin: Negative for rash.  Neurological: Negative for dizziness.    The problem list and medications were reviewed and updated by myself where necessary and exist elsewhere in the encounter.   OBJECTIVE:  BP 130/70   Pulse 81   Temp 98 F (36.7 C)   Resp 16   Ht 5' 8.11" (1.73 m)   Wt 209 lb (94.8 kg)   SpO2 98%   BMI 31.68 kg/m   Wt Readings from Last 3 Encounters:  11/03/17 209 lb (94.8 kg)  10/04/17 215 lb 3.2 oz (97.6 kg)  08/16/17 201 lb (91.2 kg)     Physical Exam   Constitutional: He is oriented to person, place, and time. He appears well-developed. He does not appear ill.  Eyes: Pupils are equal, round, and reactive to light. Conjunctivae and EOM are normal.  Cardiovascular: Normal rate, regular rhythm, S1 normal, S2 normal, normal heart sounds, intact distal pulses and normal pulses. Exam reveals no gallop and no friction rub.  No murmur heard. Pulmonary/Chest: Effort normal. No stridor. No respiratory distress. He has no wheezes. He has no rales.  Abdominal: He exhibits no distension.  Musculoskeletal: Normal range of motion. He exhibits no edema.  Neurological: He is alert and oriented to person, place, and time. No cranial nerve deficit. Coordination normal.  Skin: Skin is warm and dry. He is not diaphoretic.  Psychiatric: He has a normal mood and affect.  Nursing note and vitals reviewed.   No results found for this or any previous visit (from the past 72 hour(s)).  No results found.  ASSESSMENT AND PLAN:  Jandel was seen today for esophageal dysphagia.  Diagnoses and all orders for this visit:  Essential hypertension -     Renal Function Panel -     POCT CBC  Esophageal dysphagia: Patient going on a trip and is requesting more antibiotics today.  I have advised that this is no longer necessary as he has completed triple therapy.  I have given him specific instructions on how to take Pepcid along with Protonix if the symptoms return. -     famotidine (  PEPCID) 40 MG tablet; Take 0.5-1 tablets (20-40 mg total) by mouth daily. Try this one first for difficulty swallowing.  If symptoms remain start pantoprazole. -     pantoprazole (PROTONIX) 40 MG tablet; Take 1 tablet (40 mg total) by mouth daily before breakfast. Take thirty minutes before the first meal of the day.    The patient is advised to call or return to clinic if he does not see an improvement in symptoms, or to seek the care of the closest emergency department if he worsens with  the above plan.   Philis Fendt, MHS, PA-C [facility] 11/03/2017 1:51 PM

## 2017-11-03 NOTE — Patient Instructions (Addendum)
If the trouble swallowing comes back start by taking the famotidine 20-40 mg as soon as you notice the problem.  Repeat this in 12 hours.  If the symptoms continue for more than 24 hours start taking pantoprazole 40 mg in the morning 30 minutes before breakfast and continue taking the medication until you run out.    IF you received an x-ray today, you will receive an invoice from [facility]. Please contact [facility] at [phone] with questions or concerns regarding your invoice.   IF you received labwork today, you will receive an invoice from [facility]. Please contact [facility] at [phone] with questions or concerns regarding your invoice.   Our billing staff will not be able to assist you with questions regarding bills from these companies.  You will be contacted with the lab results as soon as they are available. The fastest way to get your results is to activate your My Chart account. Instructions are located on the last page of this paperwork. If you have not heard from [place] regarding the results in 2 weeks, please contact this office.

## 2017-11-04 LAB — RENAL FUNCTION PANEL
Albumin: 4.7 g/dL (ref 3.6–4.8)
BUN / CREAT RATIO: 12 (ref 10–24)
BUN: 14 mg/dL (ref 8–27)
CALCIUM: 9.8 mg/dL (ref 8.6–10.2)
CHLORIDE: 104 mmol/L (ref 96–106)
CO2: 24 mmol/L (ref 20–29)
CREATININE: 1.13 mg/dL (ref 0.76–1.27)
GFR calc Af Amer: 77 mL/min/{1.73_m2} (ref >59)
GFR calc non Af Amer: 67 mL/min/{1.73_m2} (ref >59)
Glucose: 107 mg/dL — ABNORMAL HIGH (ref 65–99)
PHOSPHORUS: 2.3 mg/dL — AB (ref 2.5–4.5)
Potassium: 4.6 mmol/L (ref 3.5–5.2)
Sodium: 142 mmol/L (ref 134–144)

## 2017-11-06 ENCOUNTER — Encounter: Payer: Self-pay | Admitting: Internal Medicine

## 2017-11-19 ENCOUNTER — Other Ambulatory Visit: Payer: Self-pay | Admitting: Physician Assistant

## 2017-11-19 DIAGNOSIS — R1319 Other dysphagia: Secondary | ICD-10-CM

## 2017-11-19 DIAGNOSIS — R1310 Dysphagia, unspecified: Secondary | ICD-10-CM

## 2017-11-28 ENCOUNTER — Other Ambulatory Visit: Payer: Self-pay | Admitting: Physician Assistant

## 2017-11-28 DIAGNOSIS — R1319 Other dysphagia: Secondary | ICD-10-CM

## 2017-11-28 DIAGNOSIS — R1310 Dysphagia, unspecified: Secondary | ICD-10-CM

## 2017-12-08 ENCOUNTER — Encounter (HOSPITAL_BASED_OUTPATIENT_CLINIC_OR_DEPARTMENT_OTHER): Payer: Self-pay | Admitting: Emergency Medicine

## 2017-12-08 ENCOUNTER — Other Ambulatory Visit: Payer: Self-pay

## 2017-12-08 ENCOUNTER — Emergency Department (HOSPITAL_BASED_OUTPATIENT_CLINIC_OR_DEPARTMENT_OTHER)
Admission: EM | Admit: 2017-12-08 | Discharge: 2017-12-08 | Disposition: A | Discharge status: Home / Self Care | Payer: Medicare Other | Attending: Emergency Medicine | Admitting: Emergency Medicine

## 2017-12-08 ENCOUNTER — Ambulatory Visit: Payer: Self-pay

## 2017-12-08 DIAGNOSIS — G8929 Other chronic pain: Secondary | ICD-10-CM | POA: Diagnosis not present

## 2017-12-08 DIAGNOSIS — I10 Essential (primary) hypertension: Secondary | ICD-10-CM | POA: Diagnosis not present

## 2017-12-08 DIAGNOSIS — Z8546 Personal history of malignant neoplasm of prostate: Secondary | ICD-10-CM | POA: Insufficient documentation

## 2017-12-08 DIAGNOSIS — Z79899 Other long term (current) drug therapy: Secondary | ICD-10-CM | POA: Insufficient documentation

## 2017-12-08 DIAGNOSIS — Z760 Encounter for issue of repeat prescription: Secondary | ICD-10-CM | POA: Insufficient documentation

## 2017-12-08 DIAGNOSIS — R451 Restlessness and agitation: Secondary | ICD-10-CM | POA: Insufficient documentation

## 2017-12-08 DIAGNOSIS — F17220 Nicotine dependence, chewing tobacco, uncomplicated: Secondary | ICD-10-CM | POA: Diagnosis not present

## 2017-12-08 DIAGNOSIS — G894 Chronic pain syndrome: Secondary | ICD-10-CM | POA: Diagnosis not present

## 2017-12-08 DIAGNOSIS — M545 Low back pain: Secondary | ICD-10-CM | POA: Diagnosis not present

## 2017-12-08 DIAGNOSIS — M549 Dorsalgia, unspecified: Secondary | ICD-10-CM

## 2017-12-08 DIAGNOSIS — G4700 Insomnia, unspecified: Secondary | ICD-10-CM | POA: Diagnosis not present

## 2017-12-08 DIAGNOSIS — M461 Sacroiliitis, not elsewhere classified: Secondary | ICD-10-CM | POA: Diagnosis not present

## 2017-12-08 DIAGNOSIS — M5137 Other intervertebral disc degeneration, lumbosacral region: Secondary | ICD-10-CM | POA: Diagnosis not present

## 2017-12-08 DIAGNOSIS — M47817 Spondylosis without myelopathy or radiculopathy, lumbosacral region: Secondary | ICD-10-CM | POA: Diagnosis not present

## 2017-12-08 NOTE — Discharge Instructions (Addendum)
Follow up with pain management and primary doctor for prescriptions. Return for leg weakness, numbness, fevers or bowel or bladder changes.

## 2017-12-08 NOTE — Telephone Encounter (Signed)
Pt.'s wife reports pt. Took a "new muscle relaxer he got from his orthopedic doctor last night around 7 pm." States that close to 9 p.m. He began hallucinating and seeing people that weren't there. Reports he has been up all night. She does not know the name of the medication - "he has it in his pocket." Instructed to go to ED for evaluation. Verbalizes understanding. Reason for Disposition . Bizarre or paranoid behavior  Answer Assessment - Initial Assessment Questions 1. LEVEL OF CONSCIOUSNESS: "How is he (she, the patient) acting right now?" (e.g., alert-oriented, confused, lethargic, stuporous, comatose)     Confused, hallucinations - seeing people 2. ONSET: "When did the confusion start?"  (minutes, hours, days)     Last night - constant 3. PATTERN "Does this come and go, or has it been constant since it started?"  "Is it present now?"     Constant 4. ALCOHOL or DRUGS: "Has he been drinking alcohol or taking any drugs?"      No 5. NARCOTIC MEDICATIONS: "Has he been receiving any narcotic medications?" (e.g., morphine, Vicodin)     Taking a new muscle relaxer - wife unsure of name. 6. CAUSE: "What do you think is causing the confusion?"      New medication 7. OTHER SYMPTOMS: "Are there any other symptoms?" (e.g., difficulty breathing, headache, fever, weakness)     No  Protocols used: [place]

## 2017-12-08 NOTE — ED Notes (Signed)
ED Provider at bedside. 

## 2017-12-08 NOTE — ED Triage Notes (Addendum)
States ran out of pain med on Wed and is scheduled to see MD in pain management on Monday. Unable to sleep and feeling restless . Takes percocet 10-325mg  QID

## 2017-12-13 ENCOUNTER — Ambulatory Visit (INDEPENDENT_AMBULATORY_CARE_PROVIDER_SITE_OTHER): Payer: Medicare Other | Admitting: Physician Assistant

## 2017-12-13 ENCOUNTER — Other Ambulatory Visit: Payer: Self-pay

## 2017-12-13 ENCOUNTER — Encounter: Payer: Self-pay | Admitting: Physician Assistant

## 2017-12-13 VITALS — BP 142/72 | HR 79 | Temp 98.4°F | Ht 68.0 in | Wt 216.4 lb

## 2017-12-13 DIAGNOSIS — L97511 Non-pressure chronic ulcer of other part of right foot limited to breakdown of skin: Secondary | ICD-10-CM

## 2017-12-13 MED ORDER — MUPIROCIN 2 % EX OINT: 1.0000 "application " | TOPICAL_OINTMENT | Freq: Two times a day (BID) | CUTANEOUS | 0 refills | Status: DC

## 2017-12-13 MED ORDER — CEPHALEXIN 500 MG PO CAPS: 500.0000 mg | ORAL_CAPSULE | Freq: Three times a day (TID) | ORAL | 0 refills | Status: DC

## 2017-12-13 NOTE — Progress Notes (Signed)
12/13/2017 3:09 PM   DOB: 02/03/1951 / MRN: [id number]  SUBJECTIVE:  Matthew [name] is a 67 y.o. male presenting for toe ulcer.  Started 4-5 days ago and is worsening.  Not sure what caused it.  No constitutional symptoms.   He has No Known Allergies.   He  has a past medical history of Arthritis, Cancer ([place]) (02/11/2005), Chronic back pain (11/15/2012), Gout, History of shingles, radiation therapy (08/04/05 to 09/22/2005), Hypercholesteremia, Hypertension, Sleep disturbance (11/15/2012), and Snoring (11/15/2012).    He  reports that he has never smoked. His smokeless tobacco use includes chew. He reports that he does not drink alcohol or use drugs. He  has no sexual activity history on file. The patient  has a past surgical history that includes Prostatectomy (03/2005); Neck surgery; Hernia repair (05/23/12); and Eye surgery.  His family history is not on file.  Review of Systems  Musculoskeletal: Positive for joint pain. Negative for falls.    The problem list and medications were reviewed and updated by myself where necessary and exist elsewhere in the encounter.   OBJECTIVE:  BP (!) 142/72 (BP Location: Left Arm, Patient Position: Sitting, Cuff Size: Normal)   Pulse 79   Temp 98.4 F (36.9 C) (Oral)   Ht 5\' 8"  (1.727 m)   Wt 216 lb 6.4 oz (98.2 kg)   SpO2 95%   BMI 32.90 kg/m   BP Readings from Last 3 Encounters:  12/13/17 (!) 142/72  12/08/17 140/81  11/03/17 130/70   Pulse Readings from Last 3 Encounters:  12/13/17 79  12/08/17 98  11/03/17 81      Physical Exam  Constitutional: He is oriented to person, place, and time. He appears well-developed. He does not appear ill.  Eyes: Pupils are equal, round, and reactive to light. Conjunctivae and EOM are normal.  Cardiovascular: Normal rate.  Pulmonary/Chest: Effort normal.  Abdominal: He exhibits no distension.  Musculoskeletal: Normal range of motion.  Neurological: He is alert and oriented to person, place, and time.  No cranial nerve deficit. Coordination normal.  Skin: Skin is warm and dry. He is not diaphoretic.  Psychiatric: He has a normal mood and affect.  Nursing note and vitals reviewed.       CMP Latest Ref Rng & Units 11/03/2017 06/27/2017 06/22/2017  Glucose 65 - 99 mg/dL 107(H) 121(H) 101(H)  BUN 8 - 27 mg/dL 14 17 25   Creatinine 0.76 - 1.27 mg/dL 1.13 1.14 1.57(H)  Sodium 134 - 144 mmol/L 142 141 142  Potassium 3.5 - 5.2 mmol/L 4.6 5.0 4.2  Chloride 96 - 106 mmol/L 104 102 97  CO2 20 - 29 mmol/L 24 27 28   Calcium 8.6 - 10.2 mg/dL 9.8 9.5 10.1  Total Protein 6.0 - 8.5 g/dL - - 8.0  Total Bilirubin 0.0 - 1.2 mg/dL - - 0.7  Alkaline Phos 39 - 117 IU/L - - 71  AST 0 - 40 IU/L - - 24  ALT 0 - 44 IU/L - - 25     No results found for this or any previous visit (from the past 72 hour(s)).  No results found.  ASSESSMENT AND PLAN:  [name] was seen today for foot problem.  Diagnoses and all orders for this visit:  Skin ulcer of toe of right foot, limited to breakdown of skin [facility]): Culture out.  Will see him back on Monday, sooner if needed.  -     cephALEXin (KEFLEX) 500 MG capsule; Take 1 capsule (500 mg total)  by mouth 3 (three) times daily. -     WOUND CULTURE    The patient is advised to call or return to clinic if he does not see an improvement in symptoms, or to seek the care of the closest emergency department if he worsens with the above plan.   Philis Fendt, MHS, PA-C [facility] 12/13/2017 3:09 PM

## 2017-12-13 NOTE — Patient Instructions (Addendum)
Come back on Monday for a recheck. Take medication by mouth and apply ointment.     IF you received an x-ray today, you will receive an invoice from [facility]. Please contact [facility] at [phone] with questions or concerns regarding your invoice.   IF you received labwork today, you will receive an invoice from [facility]. Please contact [facility] at [phone] with questions or concerns regarding your invoice.   Our billing staff will not be able to assist you with questions regarding bills from these companies.  You will be contacted with the lab results as soon as they are available. The fastest way to get your results is to activate your My Chart account. Instructions are located on the last page of this paperwork. If you have not heard from [place] regarding the results in 2 weeks, please contact this office.

## 2017-12-15 LAB — WOUND CULTURE

## 2017-12-19 ENCOUNTER — Encounter: Payer: Self-pay | Admitting: Physician Assistant

## 2017-12-19 ENCOUNTER — Ambulatory Visit (INDEPENDENT_AMBULATORY_CARE_PROVIDER_SITE_OTHER): Payer: Medicare Other | Admitting: Physician Assistant

## 2017-12-19 VITALS — BP 130/80 | HR 79 | Temp 99.1°F | Resp 17 | Ht 68.0 in | Wt 212.0 lb

## 2017-12-19 DIAGNOSIS — L97511 Non-pressure chronic ulcer of other part of right foot limited to breakdown of skin: Secondary | ICD-10-CM | POA: Diagnosis not present

## 2017-12-19 DIAGNOSIS — M2041 Other hammer toe(s) (acquired), right foot: Secondary | ICD-10-CM | POA: Diagnosis not present

## 2017-12-19 DIAGNOSIS — L97512 Non-pressure chronic ulcer of other part of right foot with fat layer exposed: Secondary | ICD-10-CM | POA: Diagnosis not present

## 2017-12-19 DIAGNOSIS — R1310 Dysphagia, unspecified: Secondary | ICD-10-CM

## 2017-12-19 DIAGNOSIS — I10 Essential (primary) hypertension: Secondary | ICD-10-CM | POA: Diagnosis not present

## 2017-12-19 DIAGNOSIS — R1319 Other dysphagia: Secondary | ICD-10-CM

## 2017-12-19 MED ORDER — PANTOPRAZOLE SODIUM 40 MG PO TBEC: 40.0000 mg | DELAYED_RELEASE_TABLET | Freq: Every day | ORAL | 0 refills | Status: DC | PRN

## 2017-12-19 NOTE — Progress Notes (Signed)
12/19/2017 2:49 PM   DOB: September 13, 1950 / MRN: [id number]  SUBJECTIVE:  Matthew [name] is a 67 y.o. male presenting for recheck foot ulcer.  Patient tells me that he is feeling much better with keflex. He did see podiatry who recommended shaving the bone down on the adjacent lateral great toe.  Matthew [name] is not prepared for surgery and feels that he won't be going back.   Since treating his H Pylori he tells me that he is feeling much better and only needs to take pantoprazole once every 15 days.  Denies dysphagia, odynophagia, weight loss.      He has No Known Allergies.   He  has a past medical history of Arthritis, Cancer ([place]) (02/11/2005), Chronic back pain (11/15/2012), Gout, History of shingles, radiation therapy (08/04/05 to 09/22/2005), Hypercholesteremia, Hypertension, Sleep disturbance (11/15/2012), and Snoring (11/15/2012).    He  reports that he has never smoked. His smokeless tobacco use includes chew. He reports that he does not drink alcohol or use drugs. He  has no sexual activity history on file. The patient  has a past surgical history that includes Prostatectomy (03/2005); Neck surgery; Hernia repair (05/23/12); and Eye surgery.  His family history is not on file.  Review of Systems  Constitutional: Negative for chills, diaphoresis and fever.  Eyes: Negative.   Respiratory: Negative for cough, hemoptysis, sputum production, shortness of breath and wheezing.   Cardiovascular: Negative for chest pain, orthopnea and leg swelling.  Gastrointestinal: Negative for abdominal pain, blood in stool, constipation, diarrhea, heartburn, melena, nausea and vomiting.  Genitourinary: Negative for dysuria, flank pain, frequency, hematuria and urgency.  Skin: Negative for rash.  Neurological: Negative for dizziness, sensory change, speech change, focal weakness and headaches.    The problem list and medications were reviewed and updated by myself where necessary and exist elsewhere in the  encounter.   OBJECTIVE:  BP 130/80   Pulse 79   Temp 99.1 F (37.3 C) (Oral)   Resp 17   Ht 5\' 8"  (1.727 m)   Wt 212 lb (96.2 kg)   SpO2 98%   BMI 32.23 kg/m   Physical Exam  Constitutional: He is oriented to person, place, and time. He appears well-developed. He does not appear ill.  Eyes: Pupils are equal, round, and reactive to light. Conjunctivae and EOM are normal.  Cardiovascular: Normal rate.  Pulmonary/Chest: Effort normal.  Abdominal: He exhibits no distension.  Musculoskeletal: Normal range of motion.       Feet:  Neurological: He is alert and oriented to person, place, and time. No cranial nerve deficit. Coordination normal.  Skin: Skin is warm and dry. He is not diaphoretic.  Psychiatric: He has a normal mood and affect.  Nursing note and vitals reviewed.   No results found for this or any previous visit (from the past 72 hour(s)).  No results found.  ASSESSMENT AND PLAN:  Tyre was seen today for follow-up.  Diagnoses and all orders for this visit:  Skin ulcer of toe of right foot, limited to breakdown of skin [facility]): Improving. Will refill Keflex once at previous dose, frequency, duration if he calls.   Esophageal dysphagia: Stable. PRN protonix. Will fill this as needed if he calls.  -     pantoprazole (PROTONIX) 40 MG tablet; Take 1 tablet (40 mg total) by mouth daily as needed.  Well controlled HTN: Will continue the current plan. RTC at standard interval.    The patient is advised to call or  return to clinic if he does not see an improvement in symptoms, or to seek the care of the closest emergency department if he worsens with the above plan.   Philis Fendt, MHS, PA-C [facility] 12/19/2017 2:49 PM

## 2017-12-19 NOTE — Patient Instructions (Signed)
     IF you received an x-ray today, you will receive an invoice from [facility]. Please contact  Radiology at [phone] with questions or concerns regarding your invoice.   IF you received labwork today, you will receive an invoice from [facility]. Please contact [facility] at [phone] with questions or concerns regarding your invoice.   Our billing staff will not be able to assist you with questions regarding bills from these companies.  You will be contacted with the lab results as soon as they are available. The fastest way to get your results is to activate your My Chart account. Instructions are located on the last page of this paperwork. If you have not heard from us regarding the results in 2 weeks, please contact this office.     

## 2017-12-19 NOTE — Progress Notes (Signed)
    12/19/2017 2:43 PM   DOB: [birth year] / MRN: [id number]  SUBJECTIVE:  Matthew [name] is a 67 y.o. male presenting for   He has No Known Allergies.   He  has a past medical history of Arthritis, Cancer ([place]) (02/11/2005), Chronic back pain (11/15/2012), Gout, History of shingles, radiation therapy (08/04/05 to 09/22/2005), Hypercholesteremia, Hypertension, Sleep disturbance (11/15/2012), and Snoring (11/15/2012).    He  reports that he has never smoked. His smokeless tobacco use includes chew. He reports that he does not drink alcohol or use drugs. He  has no sexual activity history on file. The patient  has a past surgical history that includes Prostatectomy (03/2005); Neck surgery; Hernia repair (05/23/12); and Eye surgery.  His family history is not on file.  ROS  The problem list and medications were reviewed and updated by myself where necessary and exist elsewhere in the encounter.   OBJECTIVE:  BP 130/80   Pulse 79   Temp 99.1 F (37.3 C) (Oral)   Resp 17   Ht 5\' 8"  (1.727 m)   Wt 212 lb (96.2 kg)   SpO2 98%   BMI 32.23 kg/m   Physical Exam  No results found for this or any previous visit (from the past 65 hour(s)).  No results found.  ASSESSMENT AND PLAN:  Sorren was seen today for follow-up.  Diagnoses and all orders for this visit:  Esophageal dysphagia -     pantoprazole (PROTONIX) 40 MG tablet; Take 1 tablet (40 mg total) by mouth daily as needed.    The patient is advised to call or return to clinic if he does not see an improvement in symptoms, or to seek the care of the closest emergency department if he worsens with the above plan.   Philis Fendt, MHS, PA-C [facility] 12/19/2017 2:43 PM

## 2017-12-21 ENCOUNTER — Other Ambulatory Visit: Payer: Self-pay | Admitting: Physician Assistant

## 2017-12-21 DIAGNOSIS — R1319 Other dysphagia: Secondary | ICD-10-CM

## 2017-12-21 DIAGNOSIS — R1310 Dysphagia, unspecified: Secondary | ICD-10-CM

## 2017-12-22 ENCOUNTER — Other Ambulatory Visit: Payer: Self-pay | Admitting: Physician Assistant

## 2017-12-22 DIAGNOSIS — L97511 Non-pressure chronic ulcer of other part of right foot limited to breakdown of skin: Secondary | ICD-10-CM

## 2017-12-22 NOTE — Telephone Encounter (Signed)
Please review one med reordered (pantoprazole) and the other one(famotidine) discontinued on the same refill request. Provider Philis Fendt, PA [facility] [phone]

## 2017-12-28 ENCOUNTER — Ambulatory Visit: Payer: Self-pay | Admitting: Internal Medicine

## 2017-12-29 DIAGNOSIS — C61 Malignant neoplasm of prostate: Secondary | ICD-10-CM | POA: Diagnosis not present

## 2018-01-02 NOTE — ED Provider Notes (Signed)
[facility] EMERGENCY DEPARTMENT Provider Note   CSN: [id number] Arrival date & time: 12/08/17  4742     History   Chief Complaint Chief Complaint  Patient presents with  . medicine refill     HPI Matthew [name] is a 67 y.o. male.  Patient presents for medicine refill.  Patient ran out of his Percocet and has felt restless and agitated since then.  Patient's had difficulty with sleeping since then.  Patient does see pain management on Monday.  Patient admits he took extra pills for pain control.  Patient denies any new weakness numbness fevers or new symptoms otherwise.     Past Medical History:  Diagnosis Date  . Arthritis   . Cancer ([place]) 02/11/2005   prostate cancer, gleason 3+3=6, vol 30 cc  . Chronic back pain 11/15/2012  . Gout   . History of shingles   . Hx of radiation therapy 08/04/05 to 09/22/2005   prostate bed  . Hypercholesteremia   . Hypertension   . Sleep disturbance 11/15/2012  . Snoring 11/15/2012    Patient Active Problem List   Diagnosis Date Noted  . Hypertension 07/10/2017  . Pain of both shoulder joints 05/03/2017  . Sleep disturbance 11/15/2012  . Snoring 11/15/2012  . Chronic back pain 11/15/2012  . Cancer ([place]) 02/11/2005    Past Surgical History:  Procedure Laterality Date  . EYE SURGERY    . HERNIA REPAIR  05/23/12   RIH  . NECK SURGERY    . PROSTATECTOMY  03/2005   Gleason 3+4=7        Home Medications    Prior to Admission medications   Medication Sig Start Date End Date Taking? Authorizing Provider  amitriptyline (ELAVIL) 25 MG tablet Take 25 mg by mouth at bedtime.    [provider]  amLODipine (NORVASC) 10 MG tablet Take 1 tablet (10 mg total) by mouth daily. 08/16/17   Tereasa Coop, PA-C  atenolol (TENORMIN) 25 MG tablet Take 1 tablet (25 mg total) by mouth daily. 08/16/17   Tereasa Coop, PA-C  atorvastatin (LIPITOR) 40 MG tablet Take 1 tablet (40 mg total) by mouth daily. 08/16/17   Tereasa Coop,  PA-C  bicalutamide (CASODEX) 50 MG tablet Take 50 mg by mouth daily.    [provider]  cephALEXin (KEFLEX) 500 MG capsule Take 1 capsule (500 mg total) by mouth 3 (three) times daily. 12/13/17   Tereasa Coop, PA-C  cyclobenzaprine (FLEXERIL) 10 MG tablet Take 10 mg by mouth 3 (three) times daily as needed. 05/22/17   [provider]  finasteride (PROSCAR) 5 MG tablet Take 5 mg by mouth daily.    [provider]  mupirocin ointment (BACTROBAN) 2 % Apply 1 application topically 2 (two) times daily. 12/13/17   Tereasa Coop, PA-C  oxyCODONE-acetaminophen (PERCOCET) 10-325 MG tablet TK 1 T PO Q 6 H PRF PAIN 10/12/17   [provider]  pantoprazole (PROTONIX) 40 MG tablet Take 1 tablet (40 mg total) by mouth daily as needed. 12/19/17   Tereasa Coop, PA-C    Family History No family history on file.  Social History Social History   Tobacco Use  . Smoking status: Never Smoker  . Smokeless tobacco: Current User    Types: Chew  . Tobacco comment: 50 + yrs started chewing tobacco at 67 yrs old  Substance Use Topics  . Alcohol use: No  . Drug use: No     Allergies  Patient has no known allergies.   Review of Systems Review of Systems  Constitutional: Negative for chills and fever.  Respiratory: Negative for shortness of breath.   Cardiovascular: Negative for chest pain.  Gastrointestinal: Negative for abdominal pain and vomiting.  Genitourinary: Negative for difficulty urinating and flank pain.  Musculoskeletal: Positive for back pain. Negative for neck pain and neck stiffness.  Skin: Negative for rash.  Neurological: Negative for light-headedness and headaches.     Physical Exam Updated Vital Signs BP 140/81 (BP Location: Left Arm)   Pulse 98   Temp 98.3 F (36.8 C) (Oral)   Resp 20   Ht 5\' 8"  (1.727 m)   Wt 95.3 kg (210 lb)   SpO2 100%   BMI 31.93 kg/m   Physical Exam  Constitutional: He is oriented to person, place, and  time. He appears well-developed and well-nourished.  HENT:  Head: Normocephalic and atraumatic.  Eyes: Conjunctivae are normal. Right eye exhibits no discharge. Left eye exhibits no discharge.  Neck: Neck supple. No tracheal deviation present.  Cardiovascular: Normal rate.  Pulmonary/Chest: Effort normal.  Abdominal: Soft. He exhibits no distension. There is no tenderness. There is no guarding.  Musculoskeletal: He exhibits tenderness (paraspinal lumbar tenderness). He exhibits no edema.  Neurological: He is alert and oriented to person, place, and time. He has normal strength. No sensory deficit.  Skin: Skin is warm.  Psychiatric: He has a normal mood and affect.  Nursing note and vitals reviewed.    ED Treatments / Results  Labs (all labs ordered are listed, but only abnormal results are displayed) Labs Reviewed - No data to display  EKG None  Radiology No results found.  Procedures Procedures (including critical care time)  Medications Ordered in ED Medications - No data to display   Initial Impression / Assessment and Plan / ED Course  I have reviewed the triage vital signs and the nursing notes.  Pertinent labs & imaging results that were available during my care of the patient were reviewed by me and considered in my medical decision making (see chart for details).     Patient presents with chronic back pain and primarily medication refill.  Patient has no new symptoms or signs of emergent concern.  Discussed I am unable to refill his narcotic pain medicine and to follow-up with primary doctor/pain management doctor. Final Clinical Impressions(s) / ED Diagnoses   Final diagnoses:  Encounter for medication refill  Chronic back pain greater than 3 months duration    ED Discharge Orders    None       Elnora Morrison, MD 01/02/18 1203

## 2018-01-04 DIAGNOSIS — M47817 Spondylosis without myelopathy or radiculopathy, lumbosacral region: Secondary | ICD-10-CM | POA: Diagnosis not present

## 2018-01-04 DIAGNOSIS — M5137 Other intervertebral disc degeneration, lumbosacral region: Secondary | ICD-10-CM | POA: Diagnosis not present

## 2018-01-04 DIAGNOSIS — M461 Sacroiliitis, not elsewhere classified: Secondary | ICD-10-CM | POA: Diagnosis not present

## 2018-01-04 DIAGNOSIS — G894 Chronic pain syndrome: Secondary | ICD-10-CM | POA: Diagnosis not present

## 2018-01-18 DIAGNOSIS — M7541 Impingement syndrome of right shoulder: Secondary | ICD-10-CM | POA: Diagnosis not present

## 2018-01-18 DIAGNOSIS — M25511 Pain in right shoulder: Secondary | ICD-10-CM | POA: Diagnosis not present

## 2018-01-23 ENCOUNTER — Other Ambulatory Visit: Payer: Self-pay | Admitting: Physician Assistant

## 2018-01-23 DIAGNOSIS — R1319 Other dysphagia: Secondary | ICD-10-CM

## 2018-01-23 DIAGNOSIS — R1310 Dysphagia, unspecified: Secondary | ICD-10-CM

## 2018-01-24 DIAGNOSIS — N5231 Erectile dysfunction following radical prostatectomy: Secondary | ICD-10-CM | POA: Diagnosis not present

## 2018-01-24 DIAGNOSIS — C61 Malignant neoplasm of prostate: Secondary | ICD-10-CM | POA: Diagnosis not present

## 2018-01-24 DIAGNOSIS — R351 Nocturia: Secondary | ICD-10-CM | POA: Diagnosis not present

## 2018-02-14 ENCOUNTER — Ambulatory Visit: Payer: Medicare Other | Admitting: Physician Assistant

## 2018-02-22 ENCOUNTER — Other Ambulatory Visit: Payer: Self-pay | Admitting: Physician Assistant

## 2018-02-22 DIAGNOSIS — R1319 Other dysphagia: Secondary | ICD-10-CM

## 2018-02-22 DIAGNOSIS — R1310 Dysphagia, unspecified: Secondary | ICD-10-CM

## 2018-03-01 ENCOUNTER — Other Ambulatory Visit: Payer: Self-pay

## 2018-03-01 ENCOUNTER — Encounter: Payer: Self-pay | Admitting: Physician Assistant

## 2018-03-01 ENCOUNTER — Ambulatory Visit (INDEPENDENT_AMBULATORY_CARE_PROVIDER_SITE_OTHER): Payer: Medicare Other | Admitting: Physician Assistant

## 2018-03-01 VITALS — BP 136/76 | HR 85 | Temp 98.5°F | Resp 18 | Ht 68.5 in | Wt 210.2 lb

## 2018-03-01 DIAGNOSIS — M67431 Ganglion, right wrist: Secondary | ICD-10-CM | POA: Diagnosis not present

## 2018-03-01 NOTE — Patient Instructions (Addendum)
IF you received an x-ray today, you will receive an invoice from [facility]. Please contact [facility] at [phone] with questions or concerns regarding your invoice.   IF you received labwork today, you will receive an invoice from [facility]. Please contact [facility] at [phone] with questions or concerns regarding your invoice.   Our billing staff will not be able to assist you with questions regarding bills from these companies.  You will be contacted with the lab results as soon as they are available. The fastest way to get your results is to activate your My Chart account. Instructions are located on the last page of this paperwork. If you have not heard from [place] regarding the results in 2 weeks, please contact this office.     Ganglion Cyst A ganglion cyst is a noncancerous, fluid-filled lump that occurs near joints or tendons. The ganglion cyst grows out of a joint or the lining of a tendon. It most often develops in the hand or wrist, but it can also develop in the shoulder, elbow, hip, knee, ankle, or foot. The round or oval ganglion cyst can be the size of a pea or larger than a grape. Increased activity may enlarge the size of the cyst because more fluid starts to build up. What are the causes? It is not known what causes a ganglion cyst to grow. However, it may be related to:  Inflammation or irritation around the joint.  An injury.  Repetitive movements or overuse.  Arthritis.  What increases the risk? Risk factors include:  Being a woman.  Being age 74-50.  What are the signs or symptoms? Symptoms may include:  A lump. This most often appears on the hand or wrist, but it can occur in other areas of the body.  Tingling.  Pain.  Numbness.  Muscle weakness.  Weak grip.  Less movement in a joint.  How is this diagnosed? Ganglion cysts are most often diagnosed based on a physical exam. Your health care provider will feel the  lump and may shine a light alongside it. If it is a ganglion cyst, a light often shines through it. Your health care provider may order an X-ray, ultrasound, or MRI to rule out other conditions. How is this treated? Ganglion cysts usually go away on their own without treatment. If pain or other symptoms are involved, treatment may be needed. Treatment is also needed if the ganglion cyst limits your movement or if it gets infected. Treatment may include:  Wearing a brace or splint on your wrist or finger.  Taking anti-inflammatory medicine.  Draining fluid from the lump with a needle (aspiration).  Injecting a steroid into the joint.  Surgery to remove the ganglion cyst.  Follow these instructions at home:  Do not press on the ganglion cyst, poke it with a needle, or hit it.  Take medicines only as directed by your health care provider.  Wear your brace or splint as directed by your health care provider.  Watch your ganglion cyst for any changes.  Keep all follow-up visits as directed by your health care provider. This is important. Contact a health care provider if:  Your ganglion cyst becomes larger or more painful.  You have increased redness, red streaks, or swelling.  You have pus coming from the lump.  You have weakness or numbness in the affected area.  You have a fever or chills. This information is not intended to replace advice given to you by your  health care provider. Make sure you discuss any questions you have with your health care provider. Document Released: 07/08/2000 Document Revised: 12/17/2015 Document Reviewed: 12/24/2013 Elsevier Interactive Patient Education  2018 [employer].

## 2018-03-01 NOTE — Progress Notes (Signed)
Matthew [name]  MRN: [id number] DOB: [birth year]  PCP: Tereasa Coop, PA-C  Chief Complaint  Patient presents with  . Mass    X 1 week on right hand    Subjective:  Pt presents to clinic for cyst on right hand - he is worried that this might be an air bubble.  It does not hurt him or bother him other than it is there. he has not noticed any bruising in the area.  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  History is obtained by patient.  Review of Systems  Constitutional: Negative for chills and fever.    Patient Active Problem List   Diagnosis Date Noted  . Hypertension 07/10/2017  . Pain of both shoulder joints 05/03/2017  . Sleep disturbance 11/15/2012  . Snoring 11/15/2012  . Chronic back pain 11/15/2012  . Cancer ([place]) 02/11/2005    Current Outpatient Medications on File Prior to Visit  Medication Sig Dispense Refill  . amitriptyline (ELAVIL) 25 MG tablet Take 25 mg by mouth at bedtime.    Marland Kitchen amLODipine (NORVASC) 10 MG tablet Take 1 tablet (10 mg total) by mouth daily. 90 tablet 3  . atenolol (TENORMIN) 25 MG tablet Take 1 tablet (25 mg total) by mouth daily. 90 tablet 3  . atorvastatin (LIPITOR) 40 MG tablet Take 1 tablet (40 mg total) by mouth daily. 90 tablet 3  . bicalutamide (CASODEX) 50 MG tablet Take 50 mg by mouth daily.    . cyclobenzaprine (FLEXERIL) 10 MG tablet Take 10 mg by mouth 3 (three) times daily as needed.  3  . finasteride (PROSCAR) 5 MG tablet Take 5 mg by mouth daily.    . mupirocin ointment (BACTROBAN) 2 % Apply 1 application topically 2 (two) times daily. 22 g 0  . oxyCODONE-acetaminophen (PERCOCET) 10-325 MG tablet TK 1 T PO Q 6 H PRF PAIN  0  . pantoprazole (PROTONIX) 40 MG tablet TAKE 1 TABLET (40 MG TOTAL) BY MOUTH DAILY AS NEEDED. 30 tablet 0  . cephALEXin (KEFLEX) 500 MG capsule Take 1 capsule (500 mg total) by mouth 3 (three) times daily. (Patient not taking: Reported on  03/01/2018) 30 capsule 0   No current facility-administered medications on file prior to visit.     No Known Allergies  Past Medical History:  Diagnosis Date  . Arthritis   . Cancer ([place]) 02/11/2005   prostate cancer, gleason 3+3=6, vol 30 cc  . Chronic back pain 11/15/2012  . Gout   . History of shingles   . Hx of radiation therapy 08/04/05 to 09/22/2005   prostate bed  . Hypercholesteremia   . Hypertension   . Sleep disturbance 11/15/2012  . Snoring 11/15/2012   Social History   Social History Narrative   Married, 1 son, pipe fitter   Social History   Tobacco Use  . Smoking status: Never Smoker  . Smokeless tobacco: Current User    Types: Chew  . Tobacco comment: 50 + yrs started chewing tobacco at 67 yrs old  Substance Use Topics  . Alcohol use: No  . Drug use: No   family history is not on file.     Objective:  BP 136/76 (BP Location: Right Arm, Patient Position: Sitting, Cuff Size: Large)   Pulse 85   Temp 98.5 F (36.9 C) (Oral)   Resp 18   Ht 5' 8.5" (1.74 m)  Wt 210 lb 3.2 oz (95.3 kg)   SpO2 97%   BMI 31.49 kg/m  Body mass index is 31.49 kg/m.  Wt Readings from Last 3 Encounters:  03/01/18 210 lb 3.2 oz (95.3 kg)  12/19/17 212 lb (96.2 kg)  12/13/17 216 lb 6.4 oz (98.2 kg)    Physical Exam  Constitutional: He is oriented to person, place, and time. He appears well-developed and well-nourished.  HENT:  Head: Normocephalic and atraumatic.  Right Ear: External ear normal.  Left Ear: External ear normal.  Eyes: Conjunctivae are normal.  Neck: Normal range of motion.  Pulmonary/Chest: Effort normal.  Musculoskeletal:       Hands: Neurological: He is alert and oriented to person, place, and time.  Skin: Skin is warm and dry.  Psychiatric: Judgment normal.  Vitals reviewed.   Assessment and Plan :  Ganglion cyst of wrist, right -discussed with patient type of cyst to expect from this cyst.  Is not bothering him now so no further evaluation or  treatment needs to be done.  But discussed with patient if it does start to bother him we can refer him to orthopedic.  Patient verbalized to me that they understand the following: diagnosis, what is being done for them, what to expect and what should be done at home.  Their questions have been answered.  See after visit summary for patient specific instructions.  Windell Hummingbird PA-C  [facility] 03/01/2018 10:28 AM  Please note: Portions of this report may have been transcribed using dragon voice recognition software. Every effort was made to ensure accuracy; however, inadvertent computerized transcription errors may be present.

## 2018-03-08 DIAGNOSIS — M5137 Other intervertebral disc degeneration, lumbosacral region: Secondary | ICD-10-CM | POA: Diagnosis not present

## 2018-03-08 DIAGNOSIS — G894 Chronic pain syndrome: Secondary | ICD-10-CM | POA: Diagnosis not present

## 2018-03-08 DIAGNOSIS — Z79891 Long term (current) use of opiate analgesic: Secondary | ICD-10-CM | POA: Diagnosis not present

## 2018-03-08 DIAGNOSIS — M47817 Spondylosis without myelopathy or radiculopathy, lumbosacral region: Secondary | ICD-10-CM | POA: Diagnosis not present

## 2018-03-08 DIAGNOSIS — M461 Sacroiliitis, not elsewhere classified: Secondary | ICD-10-CM | POA: Diagnosis not present

## 2018-03-29 ENCOUNTER — Other Ambulatory Visit: Payer: Self-pay | Admitting: Physician Assistant

## 2018-03-29 DIAGNOSIS — R1319 Other dysphagia: Secondary | ICD-10-CM

## 2018-03-29 DIAGNOSIS — R1310 Dysphagia, unspecified: Secondary | ICD-10-CM

## 2018-04-02 DIAGNOSIS — M25511 Pain in right shoulder: Secondary | ICD-10-CM | POA: Diagnosis not present

## 2018-04-17 DIAGNOSIS — M25511 Pain in right shoulder: Secondary | ICD-10-CM | POA: Diagnosis not present

## 2018-04-23 DIAGNOSIS — M25511 Pain in right shoulder: Secondary | ICD-10-CM | POA: Diagnosis not present

## 2018-05-02 DIAGNOSIS — M25511 Pain in right shoulder: Secondary | ICD-10-CM | POA: Diagnosis not present

## 2018-05-03 DIAGNOSIS — M47817 Spondylosis without myelopathy or radiculopathy, lumbosacral region: Secondary | ICD-10-CM | POA: Diagnosis not present

## 2018-05-03 DIAGNOSIS — M5137 Other intervertebral disc degeneration, lumbosacral region: Secondary | ICD-10-CM | POA: Diagnosis not present

## 2018-05-03 DIAGNOSIS — G894 Chronic pain syndrome: Secondary | ICD-10-CM | POA: Diagnosis not present

## 2018-05-03 DIAGNOSIS — M461 Sacroiliitis, not elsewhere classified: Secondary | ICD-10-CM | POA: Diagnosis not present

## 2018-05-07 DIAGNOSIS — Z23 Encounter for immunization: Secondary | ICD-10-CM | POA: Diagnosis not present

## 2018-05-17 ENCOUNTER — Encounter: Payer: Self-pay | Admitting: Family Medicine

## 2018-05-17 ENCOUNTER — Ambulatory Visit (INDEPENDENT_AMBULATORY_CARE_PROVIDER_SITE_OTHER): Payer: Medicare Other | Admitting: Family Medicine

## 2018-05-17 ENCOUNTER — Other Ambulatory Visit: Payer: Self-pay

## 2018-05-17 VITALS — BP 152/66 | HR 68 | Temp 98.2°F | Ht 68.5 in | Wt 212.4 lb

## 2018-05-17 DIAGNOSIS — R739 Hyperglycemia, unspecified: Secondary | ICD-10-CM | POA: Diagnosis not present

## 2018-05-17 DIAGNOSIS — E785 Hyperlipidemia, unspecified: Secondary | ICD-10-CM

## 2018-05-17 DIAGNOSIS — R1310 Dysphagia, unspecified: Secondary | ICD-10-CM | POA: Diagnosis not present

## 2018-05-17 DIAGNOSIS — I10 Essential (primary) hypertension: Secondary | ICD-10-CM | POA: Diagnosis not present

## 2018-05-17 DIAGNOSIS — Z131 Encounter for screening for diabetes mellitus: Secondary | ICD-10-CM

## 2018-05-17 DIAGNOSIS — K219 Gastro-esophageal reflux disease without esophagitis: Secondary | ICD-10-CM

## 2018-05-17 DIAGNOSIS — R21 Rash and other nonspecific skin eruption: Secondary | ICD-10-CM

## 2018-05-17 DIAGNOSIS — L081 Erythrasma: Secondary | ICD-10-CM

## 2018-05-17 LAB — POCT SKIN KOH: SKIN KOH, POC: NEGATIVE

## 2018-05-17 MED ORDER — CLARITHROMYCIN ER 500 MG PO TB24: 1000.0000 mg | ORAL_TABLET | Freq: Once | ORAL | 0 refills | Status: AC

## 2018-05-17 MED ORDER — CLOTRIMAZOLE-BETAMETHASONE 1-0.05 % EX CREA: 1.0000 "application " | TOPICAL_CREAM | Freq: Two times a day (BID) | CUTANEOUS | 0 refills | Status: DC

## 2018-05-17 NOTE — Progress Notes (Signed)
Subjective:  By signing my name below, I, Matthew [name], attest that this documentation has been prepared under the direction and in the presence of Wendie Agreste, MD Electronically Signed: Ladene Artist, ED Scribe 05/17/2018 at 11:11 AM.   Patient ID: Matthew [name], male    DOB: Jan 26, 1951, 67 y.o.   MRN: [id number]  Chief Complaint  Patient presents with  . heat rash all over the body    red rash going on 4 weeks-under both arms and groan area   . kidney check   HPI HPI Comments: Matthew [name] is a 67 y.o. male who presents to [facility] on [place] complaining of rash and lab check. New pt to me. Prev followed by Philis Fendt, PA-C.  HTN Lab Results  Component Value Date   CREATININE 1.13 11/03/2017   BP Readings from Last 3 Encounters:  05/17/18 (!) 166/70  03/01/18 136/76  12/19/17 130/80  Norvasc, atenolol. - Denies missed doses or new side-effects.  Hyperlipidemia Lab Results  Component Value Date   CHOL 175 06/22/2017   HDL 46 06/22/2017   LDLCALC 82 06/22/2017   TRIG 234 (H) 06/22/2017   CHOLHDL 3.8 06/22/2017   Lab Results  Component Value Date   ALT 25 06/22/2017   AST 24 06/22/2017   ALKPHOS 71 06/22/2017   BILITOT 0.7 06/22/2017  Lipitor 40 mg qd. - Denies myalgias, arthralgias, any new side-effects.  Hyperglycemia Noted on prev labs, ranging 101-121 over past yr. - Denies h/o DM or Pre-DM.  H/o GERD Treated for H.pylori earlier this yr. Had some esophageal dysphagia. Treated with Pepcid and Protonix prn for symptoms not controlled by Pepcid. - States he feels like food and water get stuck once every 2 wks. Takes Protonix ~3 days/wk. Denies unexplained weight loss, night sweats, fever.  Rash Pt noticed a pruritic, red rash x 4 wks ago while mowing the yard in the heat. Reports rash to both axillas and groin. He reports burning from to the rash after the shower and bleeding to the rash in his groin last night after showering. He has tried topical  diclofenac, desitin diaper rash paste, hydrocortisone cream once daily x 1 wk, neosporin and pain relief ointment.  Patient Active Problem List   Diagnosis Date Noted  . Hypertension 07/10/2017  . Pain of both shoulder joints 05/03/2017  . Sleep disturbance 11/15/2012  . Snoring 11/15/2012  . Chronic back pain 11/15/2012  . Cancer [place]) 02/11/2005   Past Medical History:  Diagnosis Date  . Arthritis   . Cancer ([place]) 02/11/2005   prostate cancer, gleason 3+3=6, vol 30 cc  . Chronic back pain 11/15/2012  . Gout   . History of shingles   . Hx of radiation therapy 08/04/05 to 09/22/2005   prostate bed  . Hypercholesteremia   . Hypertension   . Sleep disturbance 11/15/2012  . Snoring 11/15/2012   Past Surgical History:  Procedure Laterality Date  . EYE SURGERY    . HERNIA REPAIR  05/23/12   RIH  . NECK SURGERY    . PROSTATECTOMY  03/2005   Gleason 3+4=7   No Known Allergies Prior to Admission medications   Medication Sig Start Date End Date Taking? Authorizing Provider  amitriptyline (ELAVIL) 25 MG tablet Take 25 mg by mouth at bedtime.    [provider]  amLODipine (NORVASC) 10 MG tablet Take 1 tablet (10 mg total) by mouth daily. 08/16/17   Tereasa Coop, PA-C  atenolol (TENORMIN) 25 MG tablet Take  1 tablet (25 mg total) by mouth daily. 08/16/17   Tereasa Coop, PA-C  atorvastatin (LIPITOR) 40 MG tablet Take 1 tablet (40 mg total) by mouth daily. 08/16/17   Tereasa Coop, PA-C  bicalutamide (CASODEX) 50 MG tablet Take 50 mg by mouth daily.    [provider]  cephALEXin (KEFLEX) 500 MG capsule Take 1 capsule (500 mg total) by mouth 3 (three) times daily. Patient not taking: Reported on 03/01/2018 12/13/17   Tereasa Coop, PA-C  cyclobenzaprine (FLEXERIL) 10 MG tablet Take 10 mg by mouth 3 (three) times daily as needed. 05/22/17   [provider]  finasteride (PROSCAR) 5 MG tablet Take 5 mg by mouth daily.    [provider]  mupirocin  ointment (BACTROBAN) 2 % Apply 1 application topically 2 (two) times daily. 12/13/17   Tereasa Coop, PA-C  oxyCODONE-acetaminophen (PERCOCET) 10-325 MG tablet TK 1 T PO Q 6 H PRF PAIN 10/12/17   [provider]  pantoprazole (PROTONIX) 40 MG tablet TAKE 1 TABLET (40 MG TOTAL) BY MOUTH DAILY AS NEEDED. 02/22/18   Tereasa Coop, PA-C   Social History   Socioeconomic History  . Marital status: Married    Spouse name: Not on file  . Number of children: 1  . Years of education: Not on file  . Highest education level: Not on file  Occupational History  . Not on file  Social Needs  . Financial resource strain: Not on file  . Food insecurity:    Worry: Not on file    Inability: Not on file  . Transportation needs:    Medical: Not on file    Non-medical: Not on file  Tobacco Use  . Smoking status: Never Smoker  . Smokeless tobacco: Current User    Types: Chew  . Tobacco comment: 50 + yrs started chewing tobacco at 67 yrs old  Substance and Sexual Activity  . Alcohol use: No  . Drug use: No  . Sexual activity: Not on file  Lifestyle  . Physical activity:    Days per week: Not on file    Minutes per session: Not on file  . Stress: Not on file  Relationships  . Social connections:    Talks on phone: Not on file    Gets together: Not on file    Attends religious service: Not on file    Active member of club or organization: Not on file    Attends meetings of clubs or organizations: Not on file    Relationship status: Not on file  . Intimate partner violence:    Fear of current or ex partner: Not on file    Emotionally abused: Not on file    Physically abused: Not on file    Forced sexual activity: Not on file  Other Topics Concern  . Not on file  Social History Narrative   Married, 1 son, pipe fitter   Review of Systems  Constitutional: Negative for diaphoresis, fatigue, fever and unexpected weight change.  HENT: Positive for trouble swallowing (intermittent).     Eyes: Negative for visual disturbance.  Respiratory: Negative for cough, chest tightness and shortness of breath.   Cardiovascular: Negative for chest pain, palpitations and leg swelling.  Gastrointestinal: Negative for abdominal pain and blood in stool.  Genitourinary: Negative for hematuria.  Musculoskeletal: Negative for arthralgias and myalgias.  Skin: Positive for rash.  Neurological: Negative for dizziness, light-headedness and headaches.      Objective:  Physical Exam  Constitutional: He is oriented to person, place, and time. He appears well-developed and well-nourished.  HENT:  Head: Normocephalic and atraumatic.  Eyes: Pupils are equal, round, and reactive to light. EOM are normal.  Neck: No JVD present. Carotid bruit is not present.  Cardiovascular: Normal rate, regular rhythm and normal heart sounds.  No murmur heard. Pulmonary/Chest: Effort normal and breath sounds normal. He has no rales.  Musculoskeletal: He exhibits no edema.  Lymphadenopathy:    He has no axillary adenopathy.       Right: No inguinal adenopathy present.       Left: No inguinal adenopathy present.  Neurological: He is alert and oriented to person, place, and time.  Skin: Skin is warm and dry. Rash noted.  Erythematous patch at axilla bilat with few satellite lesions. Deep red. No bleeding or open lesions in axilla. Similar appearing rash into inguinal folds bilat. Slight spread to scrotum lateral aspect. Penis is uninvolved. Thin skin appearance with wrinkling faint scale. With Wood lamp's: I did not see fluorescence but he did shower prior to coming to office.  Psychiatric: He has a normal mood and affect.  Vitals reviewed.  Vitals:   05/17/18 1027 05/17/18 1032 05/17/18 1213  BP: (!) 170/91 (!) 166/70 (!) 152/66  Pulse: 68    Temp: 98.2 F (36.8 C)    TempSrc: Oral    SpO2: 98%    Weight: 212 lb 6.4 oz (96.3 kg)    Height: 5' 8.5" (1.74 m)        Assessment & Plan:   Matthew [name] is a  67 y.o. male Essential hypertension  -Borderline control in office, previously had been under better control.  Monitor home readings, RTC precautions or call if readings remain over 150/90.  Hyperlipidemia, unspecified hyperlipidemia type - Plan: Comprehensive metabolic panel, Lipid panel  -Check labs.  Continue Lipitor same dose  Hyperglycemia - Plan: Hemoglobin A1c Screening for diabetes mellitus - Plan: Hemoglobin A1c  -Check A1c, prior borderline hyperglycemia.  Gastroesophageal reflux disease, esophagitis presence not specified - Plan: Ambulatory referral to Gastroenterology Dysphagia, unspecified type - Plan: Ambulatory referral to Gastroenterology  -Episodic dysphagia, but has been present for many months.  Still okay to use PPI but will refer to gastroenterology to determine if imaging/endoscopy needed or change in treatment.  Rash and nonspecific skin eruption - Plan: POCT Skin KOH, clotrimazole-betamethasone (LOTRISONE) cream Erythrasma - Plan: POCT Skin KOH, clarithromycin (BIAXIN XL) 500 MG 24 hr tablet  -Rash suspicious for erythrasma.  Did not fluoresce but had just bathed prior to the office visit.  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  -Azithromycin 1000 mg x 1, Lotrisone cream twice daily as needed, RTC precautions.  Meds ordered this encounter  Medications  . clarithromycin (BIAXIN XL) 500 MG 24 hr tablet    Sig: Take 2 tablets (1,000 mg total) by mouth once for 1 dose.    Dispense:  2 tablet    Refill:  0  . clotrimazole-betamethasone (LOTRISONE) cream    Sig: Apply 1 application topically 2 (two) times daily.    Dispense:  30 g    Refill:  0   Patient Instructions    I will refer you to stomach specialist to discuss heartburn/trouble swallowing. Ok to continue the pantoprazole for now.    I will check some labs today including blood sugar, and kidney function.   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 Take 2 antibiotic pills, but  also ok  to apply the antifungal/steroid cream 2 times per day for now. Recheck if not improving in next 1 week. Return to the clinic or go to the nearest emergency room if any of your symptoms worsen or new symptoms occur. Stop lovastatin for 2 days  Blood pressure running a little too high, but has been ok in past. Keep a record of your blood pressures outside of the office and if over 150/90 - let me know so we can change meds.   Return to the clinic or go to the nearest emergency room if any of your symptoms worsen or new symptoms occur.   Managing Your Hypertension Hypertension is commonly called high blood pressure. This is when the force of your blood pressing against the walls of your arteries is too strong. Arteries are blood vessels that carry blood from your heart throughout your body. Hypertension forces the heart to work harder to pump blood, and may cause the arteries to become narrow or stiff. Having untreated or uncontrolled hypertension can cause heart attack, stroke, kidney disease, and other problems. What are blood pressure readings? A blood pressure reading consists of a higher number over a lower number. Ideally, your blood pressure should be below 120/80. The first ("top") number is called the systolic pressure. It is a measure of the pressure in your arteries as your heart beats. The second ("bottom") number is called the diastolic pressure. It is a measure of the pressure in your arteries as the heart relaxes. What does my blood pressure reading mean? Blood pressure is classified into four stages. Based on your blood pressure reading, your health care provider may use the following stages to determine what type of treatment you need, if any. Systolic pressure and diastolic pressure are measured in a unit called mm Hg. Normal  Systolic pressure: below 213.  Diastolic pressure: below 80. Elevated  Systolic pressure: 086-578.  Diastolic pressure: below 80. Hypertension  stage 1  Systolic pressure: 469-629.  Diastolic pressure: 52-84. Hypertension stage 2  Systolic pressure: 132 or above.  Diastolic pressure: 90 or above. What health risks are associated with hypertension? Managing your hypertension is an important responsibility. Uncontrolled hypertension can lead to:  A heart attack.  A stroke.  A weakened blood vessel (aneurysm).  Heart failure.  Kidney damage.  Eye damage.  Metabolic syndrome.  Memory and concentration problems.  What changes can I make to manage my hypertension? Hypertension can be managed by making lifestyle changes and possibly by taking medicines. Your health care provider will help you make a plan to bring your blood pressure within a normal range. Eating and drinking  Eat a diet that is high in fiber and potassium, and low in salt (sodium), added sugar, and fat. An example eating plan is called the DASH (Dietary Approaches to Stop Hypertension) diet. To eat this way: ? Eat plenty of fresh fruits and vegetables. Try to fill half of your plate at each meal with fruits and vegetables. ? Eat whole grains, such as whole wheat pasta, brown rice, or whole grain bread. Fill about one quarter of your plate with whole grains. ? Eat low-fat diary products. ? Avoid fatty cuts of meat, processed or cured meats, and poultry with skin. Fill about one quarter of your plate with lean proteins such as fish, chicken without skin, beans, eggs, and tofu. ? Avoid premade and processed foods. These tend to be higher in sodium, added sugar, and fat.  Reduce your daily sodium intake. Most people with  hypertension should eat less than 1,500 mg of sodium a day.  Limit alcohol intake to no more than 1 drink a day for nonpregnant women and 2 drinks a day for men. One drink equals 12 oz of beer, 5 oz of wine, or 1 oz of hard liquor. Lifestyle  Work with your health care provider to maintain a healthy body weight, or to lose weight. Ask what  an ideal weight is for you.  Get at least 30 minutes of exercise that causes your heart to beat faster (aerobic exercise) most days of the week. Activities may include walking, swimming, or biking.  Include exercise to strengthen your muscles (resistance exercise), such as weight lifting, as part of your weekly exercise routine. Try to do these types of exercises for 30 minutes at least 3 days a week.  Do not use any products that contain nicotine or tobacco, such as cigarettes and e-cigarettes. If you need help quitting, ask your health care provider.  Control any long-term (chronic) conditions you have, such as high cholesterol or diabetes. Monitoring  Monitor your blood pressure at home as told by your health care provider. Your personal target blood pressure may vary depending on your medical conditions, your age, and other factors.  Have your blood pressure checked regularly, as often as told by your health care provider. Working with your health care provider  Review all the medicines you take with your health care provider because there may be side effects or interactions.  Talk with your health care provider about your diet, exercise habits, and other lifestyle factors that may be contributing to hypertension.  Visit your health care provider regularly. Your health care provider can help you create and adjust your plan for managing hypertension. Will I need medicine to control my blood pressure? Your health care provider may prescribe medicine if lifestyle changes are not enough to get your blood pressure under control, and if:  Your systolic blood pressure is 130 or higher.  Your diastolic blood pressure is 80 or higher.  Take medicines only as told by your health care provider. Follow the directions carefully. Blood pressure medicines must be taken as prescribed. The medicine does not work as well when you skip doses. Skipping doses also puts you at risk for problems. Contact a  health care provider if:  You think you are having a reaction to medicines you have taken.  You have repeated (recurrent) headaches.  You feel dizzy.  You have swelling in your ankles.  You have trouble with your vision. Get help right away if:  You develop a severe headache or confusion.  You have unusual weakness or numbness, or you feel faint.  You have severe pain in your chest or abdomen.  You vomit repeatedly.  You have trouble breathing. Summary  Hypertension is when the force of blood pumping through your arteries is too strong. If this condition is not controlled, it may put you at risk for serious complications.  Your personal target blood pressure may vary depending on your medical conditions, your age, and other factors. For most people, a normal blood pressure is less than 120/80.  Hypertension is managed by lifestyle changes, medicines, or both. Lifestyle changes include weight loss, eating a healthy, low-sodium diet, exercising more, and limiting alcohol. This information is not intended to replace advice given to you by your health care provider. Make sure you discuss any questions you have with your health care provider. Document Released: 04/04/2012 Document Revised: 06/08/2016 Document  Reviewed: 06/08/2016 Elsevier Interactive Patient Education  2018 [employer].     Rash A rash is a change in the color of the skin. A rash can also change the way your skin feels. There are many different conditions and factors that can cause a rash. Follow these instructions at home: Pay attention to any changes in your symptoms. Follow these instructions to help with your condition: Medicine Take or apply over-the-counter and prescription medicines only as told by your health care provider. These may include:  Corticosteroid cream.  Anti-itch lotions.  Oral antihistamines.  Skin Care  Apply cool compresses to the affected areas.  Try taking a bath  with: ? Epsom salts. Follow the instructions on the packaging. You can get these at your local pharmacy or grocery store. ? Baking soda. Pour a small amount into the bath as told by your health care provider. ? Colloidal oatmeal. Follow the instructions on the packaging. You can get this at your local pharmacy or grocery store.  Try applying baking soda paste to your skin. Stir water into baking soda until it reaches a paste-like consistency.  Do not scratch or rub your skin.  Avoid covering the rash. Make sure the rash is exposed to air as much as possible. General instructions  Avoid hot showers or baths, which can make itching worse. A cold shower may help.  Avoid scented soaps, detergents, and perfumes. Use gentle soaps, detergents, perfumes, and other cosmetic products.  Avoid any substance that causes your rash. Keep a journal to help track what causes your rash. Write down: ? What you eat. ? What cosmetic products you use. ? What you drink. ? What you wear. This includes jewelry.  Keep all follow-up visits as told by your health care provider. This is important. Contact a health care provider if:  You sweat at night.  You lose weight.  You urinate more than normal.  You feel weak.  You vomit.  Your skin or the whites of your eyes look yellow (jaundice).  Your skin: ? Tingles. ? Is numb.  Your rash: ? Does not go away after several days. ? Gets worse.  You are: ? Unusually thirsty. ? More tired than normal.  You have: ? New symptoms. ? Pain in your abdomen. ? A fever. ? Diarrhea. Get help right away if:  You develop a rash that covers all or most of your body. The rash may or may not be painful.  You develop blisters that: ? Are on top of the rash. ? Grow larger or grow together. ? Are painful. ? Are inside your nose or mouth.  You develop a rash that: ? Looks like purple pinprick-sized spots all over your body. ? Has a "bull's eye" or looks  like a target. ? Is not related to sun exposure, is red and painful, and causes your skin to peel. This information is not intended to replace advice given to you by your health care provider. Make sure you discuss any questions you have with your health care provider. Document Released: 07/01/2002 Document Revised: 12/15/2015 Document Reviewed: 11/26/2014 Elsevier Interactive Patient Education  [employer].   If you have lab work done today you will be contacted with your lab results within the next 2 weeks.  If you have not heard from [place] then please contact us. The fastest way to get your results is to register for My Chart.   IF you received an x-ray today, you will receive an invoice  from [facility]. Please contact [facility] at [phone] with questions or concerns regarding your invoice.   IF you received labwork today, you will receive an invoice from [facility]. Please contact [facility] at [phone] with questions or concerns regarding your invoice.   Our billing staff will not be able to assist you with questions regarding bills from these companies.  You will be contacted with the lab results as soon as they are available. The fastest way to get your results is to activate your My Chart account. Instructions are located on the last page of this paperwork. If you have not heard from [place] regarding the results in 2 weeks, please contact this office.       I personally performed the services described in this documentation, which was scribed in my presence. The recorded information has been reviewed and considered for accuracy and completeness, addended by me as needed, and agree with information above.  Signed,   Merri Ray, MD [facility].  05/17/18 6:18 PM

## 2018-05-17 NOTE — Patient Instructions (Addendum)
I will refer you to stomach specialist to discuss heartburn/trouble swallowing. Ok to continue the pantoprazole for now.    I will check some labs today including blood sugar, and kidney function.   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 Take 2 antibiotic pills, but also ok to apply the antifungal/steroid cream 2 times per day for now. Recheck if not improving in next 1 week. Return to the clinic or go to the nearest emergency room if any of your symptoms worsen or new symptoms occur. Stop lovastatin for 2 days  Blood pressure running a little too high, but has been ok in past. Keep a record of your blood pressures outside of the office and if over 150/90 - let me know so we can change meds.   Return to the clinic or go to the nearest emergency room if any of your symptoms worsen or new symptoms occur.   Managing Your Hypertension Hypertension is commonly called high blood pressure. This is when the force of your blood pressing against the walls of your arteries is too strong. Arteries are blood vessels that carry blood from your heart throughout your body. Hypertension forces the heart to work harder to pump blood, and may cause the arteries to become narrow or stiff. Having untreated or uncontrolled hypertension can cause heart attack, stroke, kidney disease, and other problems. What are blood pressure readings? A blood pressure reading consists of a higher number over a lower number. Ideally, your blood pressure should be below 120/80. The first ("top") number is called the systolic pressure. It is a measure of the pressure in your arteries as your heart beats. The second ("bottom") number is called the diastolic pressure. It is a measure of the pressure in your arteries as the heart relaxes. What does my blood pressure reading mean? Blood pressure is classified into four stages. Based on your blood pressure reading, your health care provider may use the following stages to determine  what type of treatment you need, if any. Systolic pressure and diastolic pressure are measured in a unit called mm Hg. Normal  Systolic pressure: below 174.  Diastolic pressure: below 80. Elevated  Systolic pressure: 944-967.  Diastolic pressure: below 80. Hypertension stage 1  Systolic pressure: 591-638.  Diastolic pressure: 46-65. Hypertension stage 2  Systolic pressure: 993 or above.  Diastolic pressure: 90 or above. What health risks are associated with hypertension? Managing your hypertension is an important responsibility. Uncontrolled hypertension can lead to:  A heart attack.  A stroke.  A weakened blood vessel (aneurysm).  Heart failure.  Kidney damage.  Eye damage.  Metabolic syndrome.  Memory and concentration problems.  What changes can I make to manage my hypertension? Hypertension can be managed by making lifestyle changes and possibly by taking medicines. Your health care provider will help you make a plan to bring your blood pressure within a normal range. Eating and drinking  Eat a diet that is high in fiber and potassium, and low in salt (sodium), added sugar, and fat. An example eating plan is called the DASH (Dietary Approaches to Stop Hypertension) diet. To eat this way: ? Eat plenty of fresh fruits and vegetables. Try to fill half of your plate at each meal with fruits and vegetables. ? Eat whole grains, such as whole wheat pasta, brown rice, or whole grain bread. Fill about one quarter of your plate with whole grains. ? Eat low-fat diary products. ? Avoid fatty cuts of meat, processed or cured meats,  and poultry with skin. Fill about one quarter of your plate with lean proteins such as fish, chicken without skin, beans, eggs, and tofu. ? Avoid premade and processed foods. These tend to be higher in sodium, added sugar, and fat.  Reduce your daily sodium intake. Most people with hypertension should eat less than 1,500 mg of sodium a  day.  Limit alcohol intake to no more than 1 drink a day for nonpregnant women and 2 drinks a day for men. One drink equals 12 oz of beer, 5 oz of wine, or 1 oz of hard liquor. Lifestyle  Work with your health care provider to maintain a healthy body weight, or to lose weight. Ask what an ideal weight is for you.  Get at least 30 minutes of exercise that causes your heart to beat faster (aerobic exercise) most days of the week. Activities may include walking, swimming, or biking.  Include exercise to strengthen your muscles (resistance exercise), such as weight lifting, as part of your weekly exercise routine. Try to do these types of exercises for 30 minutes at least 3 days a week.  Do not use any products that contain nicotine or tobacco, such as cigarettes and e-cigarettes. If you need help quitting, ask your health care provider.  Control any long-term (chronic) conditions you have, such as high cholesterol or diabetes. Monitoring  Monitor your blood pressure at home as told by your health care provider. Your personal target blood pressure may vary depending on your medical conditions, your age, and other factors.  Have your blood pressure checked regularly, as often as told by your health care provider. Working with your health care provider  Review all the medicines you take with your health care provider because there may be side effects or interactions.  Talk with your health care provider about your diet, exercise habits, and other lifestyle factors that may be contributing to hypertension.  Visit your health care provider regularly. Your health care provider can help you create and adjust your plan for managing hypertension. Will I need medicine to control my blood pressure? Your health care provider may prescribe medicine if lifestyle changes are not enough to get your blood pressure under control, and if:  Your systolic blood pressure is 130 or higher.  Your diastolic  blood pressure is 80 or higher.  Take medicines only as told by your health care provider. Follow the directions carefully. Blood pressure medicines must be taken as prescribed. The medicine does not work as well when you skip doses. Skipping doses also puts you at risk for problems. Contact a health care provider if:  You think you are having a reaction to medicines you have taken.  You have repeated (recurrent) headaches.  You feel dizzy.  You have swelling in your ankles.  You have trouble with your vision. Get help right away if:  You develop a severe headache or confusion.  You have unusual weakness or numbness, or you feel faint.  You have severe pain in your chest or abdomen.  You vomit repeatedly.  You have trouble breathing. Summary  Hypertension is when the force of blood pumping through your arteries is too strong. If this condition is not controlled, it may put you at risk for serious complications.  Your personal target blood pressure may vary depending on your medical conditions, your age, and other factors. For most people, a normal blood pressure is less than 120/80.  Hypertension is managed by lifestyle changes, medicines, or both. Lifestyle  changes include weight loss, eating a healthy, low-sodium diet, exercising more, and limiting alcohol. This information is not intended to replace advice given to you by your health care provider. Make sure you discuss any questions you have with your health care provider. Document Released: 04/04/2012 Document Revised: 06/08/2016 Document Reviewed: 06/08/2016 Elsevier Interactive Patient Education  2018 [employer].     Rash A rash is a change in the color of the skin. A rash can also change the way your skin feels. There are many different conditions and factors that can cause a rash. Follow these instructions at home: Pay attention to any changes in your symptoms. Follow these instructions to help with your  condition: Medicine Take or apply over-the-counter and prescription medicines only as told by your health care provider. These may include:  Corticosteroid cream.  Anti-itch lotions.  Oral antihistamines.  Skin Care  Apply cool compresses to the affected areas.  Try taking a bath with: ? Epsom salts. Follow the instructions on the packaging. You can get these at your local pharmacy or grocery store. ? Baking soda. Pour a small amount into the bath as told by your health care provider. ? Colloidal oatmeal. Follow the instructions on the packaging. You can get this at your local pharmacy or grocery store.  Try applying baking soda paste to your skin. Stir water into baking soda until it reaches a paste-like consistency.  Do not scratch or rub your skin.  Avoid covering the rash. Make sure the rash is exposed to air as much as possible. General instructions  Avoid hot showers or baths, which can make itching worse. A cold shower may help.  Avoid scented soaps, detergents, and perfumes. Use gentle soaps, detergents, perfumes, and other cosmetic products.  Avoid any substance that causes your rash. Keep a journal to help track what causes your rash. Write down: ? What you eat. ? What cosmetic products you use. ? What you drink. ? What you wear. This includes jewelry.  Keep all follow-up visits as told by your health care provider. This is important. Contact a health care provider if:  You sweat at night.  You lose weight.  You urinate more than normal.  You feel weak.  You vomit.  Your skin or the whites of your eyes look yellow (jaundice).  Your skin: ? Tingles. ? Is numb.  Your rash: ? Does not go away after several days. ? Gets worse.  You are: ? Unusually thirsty. ? More tired than normal.  You have: ? New symptoms. ? Pain in your abdomen. ? A fever. ? Diarrhea. Get help right away if:  You develop a rash that covers all or most of your body. The  rash may or may not be painful.  You develop blisters that: ? Are on top of the rash. ? Grow larger or grow together. ? Are painful. ? Are inside your nose or mouth.  You develop a rash that: ? Looks like purple pinprick-sized spots all over your body. ? Has a "bull's eye" or looks like a target. ? Is not related to sun exposure, is red and painful, and causes your skin to peel. This information is not intended to replace advice given to you by your health care provider. Make sure you discuss any questions you have with your health care provider. Document Released: 07/01/2002 Document Revised: 12/15/2015 Document Reviewed: 11/26/2014 Elsevier Interactive Patient Education  [employer].   If you have lab work done today you  will be contacted with your lab results within the next 2 weeks.  If you have not heard from [place] then please contact us. The fastest way to get your results is to register for My Chart.   IF you received an x-ray today, you will receive an invoice from [facility]. Please contact [facility] at [phone] with questions or concerns regarding your invoice.   IF you received labwork today, you will receive an invoice from [facility]. Please contact [facility] at [phone] with questions or concerns regarding your invoice.   Our billing staff will not be able to assist you with questions regarding bills from these companies.  You will be contacted with the lab results as soon as they are available. The fastest way to get your results is to activate your My Chart account. Instructions are located on the last page of this paperwork. If you have not heard from [place] regarding the results in 2 weeks, please contact this office.

## 2018-05-18 ENCOUNTER — Encounter: Payer: Self-pay | Admitting: Gastroenterology

## 2018-05-18 LAB — LIPID PANEL
CHOL/HDL RATIO: 6.2 ratio — AB (ref 0.0–5.0)
Cholesterol, Total: 168 mg/dL (ref 100–199)
HDL: 27 mg/dL — AB (ref >39)
LDL Calculated: 74 mg/dL (ref 0–99)
Triglycerides: 333 mg/dL — ABNORMAL HIGH (ref 0–149)
VLDL Cholesterol Cal: 67 mg/dL — ABNORMAL HIGH (ref 5–40)

## 2018-05-18 LAB — COMPREHENSIVE METABOLIC PANEL
A/G RATIO: 1.8 (ref 1.2–2.2)
ALK PHOS: 82 IU/L (ref 39–117)
ALT: 22 IU/L (ref 0–44)
AST: 24 IU/L (ref 0–40)
Albumin: 4.7 g/dL (ref 3.6–4.8)
BILIRUBIN TOTAL: 0.5 mg/dL (ref 0.0–1.2)
BUN / CREAT RATIO: 15 (ref 10–24)
BUN: 16 mg/dL (ref 8–27)
CALCIUM: 10 mg/dL (ref 8.6–10.2)
CO2: 25 mmol/L (ref 20–29)
Chloride: 101 mmol/L (ref 96–106)
Creatinine, Ser: 1.07 mg/dL (ref 0.76–1.27)
GFR, EST AFRICAN AMERICAN: 82 mL/min/{1.73_m2} (ref >59)
GFR, EST NON AFRICAN AMERICAN: 71 mL/min/{1.73_m2} (ref >59)
GLOBULIN, TOTAL: 2.6 g/dL (ref 1.5–4.5)
Glucose: 122 mg/dL — ABNORMAL HIGH (ref 65–99)
POTASSIUM: 5 mmol/L (ref 3.5–5.2)
SODIUM: 142 mmol/L (ref 134–144)
Total Protein: 7.3 g/dL (ref 6.0–8.5)

## 2018-05-18 LAB — HEMOGLOBIN A1C
ESTIMATED AVERAGE GLUCOSE: 117 mg/dL
HEMOGLOBIN A1C: 5.7 % — AB (ref 4.8–5.6)

## 2018-05-23 ENCOUNTER — Encounter: Payer: Self-pay | Admitting: Gastroenterology

## 2018-05-23 ENCOUNTER — Ambulatory Visit (INDEPENDENT_AMBULATORY_CARE_PROVIDER_SITE_OTHER): Payer: Medicare Other | Admitting: Gastroenterology

## 2018-05-23 VITALS — BP 170/78 | HR 92 | Ht 68.5 in | Wt 211.0 lb

## 2018-05-23 DIAGNOSIS — R1310 Dysphagia, unspecified: Secondary | ICD-10-CM | POA: Diagnosis not present

## 2018-05-23 DIAGNOSIS — R1319 Other dysphagia: Secondary | ICD-10-CM

## 2018-05-23 NOTE — Progress Notes (Addendum)
[facility] Consult Note:  History: Matthew [name] 05/23/2018  Referring physician: Wendie Agreste, MD  Reason for consult/chief complaint: Dysphagia (pt reports intermittent episodes of trouble swallowing, including water;  pt was given Pantoprazole which he says helps)   Subjective  HPI:  This is a very pleasant 67 year old man referred by primary care for about 3 months of dysphagia.  He has episodes perhaps every 10 to 14 days, where food will feel stuck in the lower chest, it will then start hiccups, and then the feeling that "nothing will go up or down".  When that occurs, he cannot even get water to go down, and then eventually it will pass.  He feels well between episodes, and he never has heartburn.  He had been prescribed pantoprazole to take as needed, but says it does not help during the acute episodes.  Sometimes if he takes it preventatively when he is eating out, he thinks it might decrease the chance of an episode occurring.  His appetite is remained good and his weight stable.  He does not recall having had symptoms like this prior to its onset a few months ago. Matthew [name] also reports 3 prior routine colonoscopies that he believes were done in [facility], but none of those records are available today.  ROS:  Review of Systems  Constitutional: Negative for appetite change and unexpected weight change.  HENT: Negative for mouth sores and voice change.   Eyes: Negative for pain and redness.  Respiratory: Negative for cough and shortness of breath.   Cardiovascular: Negative for chest pain and palpitations.  Genitourinary: Negative for dysuria and hematuria.  Musculoskeletal: Negative for arthralgias and myalgias.  Skin: Negative for pallor and rash.  Neurological: Negative for weakness and headaches.  Hematological: Negative for adenopathy.     Past Medical History: Past Medical History:  Diagnosis Date  . Arthritis   . Cancer ([place]) 02/11/2005     prostate cancer, gleason 3+3=6, vol 30 cc  . Chronic back pain 11/15/2012  . Gout   . History of shingles   . Hx of radiation therapy 08/04/05 to 09/22/2005   prostate bed  . Hypercholesteremia   . Hypertension   . Sleep disturbance 11/15/2012  . Snoring 11/15/2012     Past Surgical History: Past Surgical History:  Procedure Laterality Date  . EYE SURGERY    . HERNIA REPAIR  05/23/12   RIH  . NECK SURGERY    . PROSTATECTOMY  03/2005   Gleason 3+4=7     Family History: History reviewed. No pertinent family history. No known family history of esophageal or stomach cancer  Social History: Social History   Socioeconomic History  . Marital status: Married    Spouse name: Not on file  . Number of children: 1  . Years of education: Not on file  . Highest education level: Not on file  Occupational History  . Not on file  Social Needs  . Financial resource strain: Not on file  . Food insecurity:    Worry: Not on file    Inability: Not on file  . Transportation needs:    Medical: Not on file    Non-medical: Not on file  Tobacco Use  . Smoking status: Never Smoker  . Smokeless tobacco: Current User    Types: Chew  . Tobacco comment: 50 + yrs started chewing tobacco at 67 yrs old  Substance and Sexual Activity  . Alcohol use: No  . Drug use: No  .  Sexual activity: Not on file  Lifestyle  . Physical activity:    Days per week: Not on file    Minutes per session: Not on file  . Stress: Not on file  Relationships  . Social connections:    Talks on phone: Not on file    Gets together: Not on file    Attends religious service: Not on file    Active member of club or organization: Not on file    Attends meetings of clubs or organizations: Not on file    Relationship status: Not on file  Other Topics Concern  . Not on file  Social History Narrative   Married, 1 son, pipe fitter   He continues to chew tobacco  Allergies: No Known Allergies  Outpatient  Meds: Current Outpatient Medications  Medication Sig Dispense Refill  . amitriptyline (ELAVIL) 25 MG tablet Take 25 mg by mouth at bedtime.    Marland Kitchen amLODipine (NORVASC) 10 MG tablet Take 1 tablet (10 mg total) by mouth daily. 90 tablet 3  . atenolol (TENORMIN) 25 MG tablet Take 1 tablet (25 mg total) by mouth daily. 90 tablet 3  . atorvastatin (LIPITOR) 40 MG tablet Take 1 tablet (40 mg total) by mouth daily. 90 tablet 3  . bicalutamide (CASODEX) 50 MG tablet Take 50 mg by mouth daily.    . clotrimazole-betamethasone (LOTRISONE) cream Apply 1 application topically 2 (two) times daily. 30 g 0  . cyclobenzaprine (FLEXERIL) 10 MG tablet Take 10 mg by mouth 3 (three) times daily as needed.  3  . finasteride (PROSCAR) 5 MG tablet Take 5 mg by mouth daily.    . mupirocin ointment (BACTROBAN) 2 % Apply 1 application topically 2 (two) times daily. 22 g 0  . oxyCODONE-acetaminophen (PERCOCET) 10-325 MG tablet TK 1 T PO Q 6 H PRF PAIN  0  . pantoprazole (PROTONIX) 40 MG tablet TAKE 1 TABLET (40 MG TOTAL) BY MOUTH DAILY AS NEEDED. 30 tablet 0   No current facility-administered medications for this visit.       ___________________________________________________________________ Objective   Exam:  BP (!) 170/78   Pulse 92   Ht 5' 8.5" (1.74 m)   Wt 211 lb (95.7 kg)   BMI 31.62 kg/m    General: this is a(n) well-appearing man with normal vocal quality  Eyes: sclera anicteric, no redness  ENT: oral mucosa moist without lesions, no cervical or supraclavicular lymphadenopathy, poor dentition  CV: RRR without murmur, S1/S2, no JVD, no peripheral edema  Resp: clear to auscultation bilaterally, normal RR and effort noted  GI: soft, no tenderness, with active bowel sounds. No guarding or palpable organomegaly noted.  Skin; warm and dry, no rash or jaundice noted  Neuro: awake, alert and oriented x 3. Normal gross motor function and fluent speech  Labs:  CBC Latest Ref Rng & Units 11/03/2017  06/22/2017 11/01/2011  WBC 4.6 - 10.2 K/uL 6.2 8.8 5.5  Hemoglobin 14.1 - 18.1 g/dL 14.1 15.6 15.0  Hematocrit 43.5 - 53.7 % 41.1(A) 43.8 43.2  Platelets 150 - 379 x10E3/uL - 202 138(L)   No imaging to review  Assessment: Encounter Diagnosis  Name Primary?  . Esophageal dysphagia Yes    Most likely ring or stricture.  No chronic heartburn.  Less likely motility disorder such as esophageal spasm or achalasia.  Plan:  Upper endoscopy with probable dilation.  He is agreeable after a thorough discussion of procedure and risks.  The benefits and risks of the planned procedure  were described in detail with the patient or (when appropriate) their health care proxy.  Risks were outlined as including, but not limited to, bleeding, infection, perforation, adverse medication reaction leading to cardiac or pulmonary decompensation, or pancreatitis (if ERCP).  The limitation of incomplete mucosal visualization was also discussed.  No guarantees or warranties were given.  He will take the Protonix daily until the time of endoscopy since there is the possibility of a reflux related stricture.  Thank you for the courtesy of this consult.  Please call me with any questions or concerns.  Nelida Meuse III  CC: Wendie Agreste, MD

## 2018-05-23 NOTE — Patient Instructions (Signed)
If you are age 68 or older, your body mass index should be between 23-30. Your Body mass index is 31.62 kg/m. If this is out of the aforementioned range listed, please consider follow up with your Primary Care Provider.  If you are age 15 or younger, your body mass index should be between 19-25. Your Body mass index is 31.62 kg/m. If this is out of the aformentioned range listed, please consider follow up with your Primary Care Provider.   You have been scheduled for an endoscopy. Please follow written instructions given to you at your visit today. If you use inhalers (even only as needed), please bring them with you on the day of your procedure. Your physician has requested that you go to www.startemmi.com and enter the access code given to you at your visit today. This web site gives a general overview about your procedure. However, you should still follow specific instructions given to you by our office regarding your preparation for the procedure.  It was a pleasure to see you today!  Dr. Loletha Carrow

## 2018-05-25 ENCOUNTER — Encounter: Payer: Self-pay | Admitting: Gastroenterology

## 2018-05-25 ENCOUNTER — Ambulatory Visit (AMBULATORY_SURGERY_CENTER): Payer: Medicare Other | Admitting: Gastroenterology

## 2018-05-25 VITALS — BP 180/88 | HR 71 | Temp 95.9°F | Resp 12 | Ht 72.0 in | Wt 222.0 lb

## 2018-05-25 DIAGNOSIS — R1310 Dysphagia, unspecified: Secondary | ICD-10-CM | POA: Diagnosis not present

## 2018-05-25 DIAGNOSIS — K208 Other esophagitis: Secondary | ICD-10-CM | POA: Diagnosis not present

## 2018-05-25 DIAGNOSIS — K222 Esophageal obstruction: Secondary | ICD-10-CM

## 2018-05-25 DIAGNOSIS — K449 Diaphragmatic hernia without obstruction or gangrene: Secondary | ICD-10-CM | POA: Diagnosis not present

## 2018-05-25 DIAGNOSIS — K2970 Gastritis, unspecified, without bleeding: Secondary | ICD-10-CM | POA: Diagnosis not present

## 2018-05-25 DIAGNOSIS — R1319 Other dysphagia: Secondary | ICD-10-CM

## 2018-05-25 MED ORDER — SODIUM CHLORIDE 0.9 % IV SOLN: 500.0000 mL | Freq: Once | INTRAVENOUS | Status: DC

## 2018-05-25 NOTE — Progress Notes (Signed)
A/ox3 pleased with MAC, report to RN 

## 2018-05-25 NOTE — Progress Notes (Signed)
Pt's states no medical or surgical changes since previsit or office visit. 

## 2018-05-25 NOTE — Op Note (Signed)
[facility] Patient Name: Matthew [name] Procedure Date: 05/25/2018 2:26 PM MRN: [id number] Endoscopist: Mallie Mussel L. Loletha Carrow , MD Age: 67 Referring MD:  Date of Birth: 1951-02-07 Gender: Male Account #: [id number] Procedure:                Upper GI endoscopy Indications:              Dysphagia Medicines:                Monitored Anesthesia Care Procedure:                Pre-Anesthesia Assessment:                           - Prior to the procedure, a History and Physical                            was performed, and patient medications and                            allergies were reviewed. The patient's tolerance of                            previous anesthesia was also reviewed. The risks                            and benefits of the procedure and the sedation                            options and risks were discussed with the patient.                            All questions were answered, and informed consent                            was obtained. Prior Anticoagulants: The patient has                            taken no previous anticoagulant or antiplatelet                            agents. ASA Grade Assessment: II - A patient with                            mild systemic disease. After reviewing the risks                            and benefits, the patient was deemed in                            satisfactory condition to undergo the procedure.                           After obtaining informed consent, the endoscope was  passed under direct vision. Throughout the                            procedure, the patient's blood pressure, pulse, and                            oxygen saturations were monitored continuously. The                            Model GIF-HQ190 [id number]) scope was introduced                            through the mouth, and advanced to the second part                            of duodenum. The upper GI endoscopy was somewhat                             difficult due to narrowing. The patient tolerated                            the procedure well. Scope In: Scope Out: Findings:                 A diverticulum with a large opening was found in                            the middle third of the esophagus.                           One benign-appearing, intrinsic severe stenosis was                            found 33 to 36 cm from the incisors. This stenosis                            measured 8 mm (inner diameter) x 3 cm (in length).                            The stenosis was traversed after dilation. A TTS                            dilator was passed through the scope. Dilation with                            a 13.5-14.5-15.5 mm balloon dilator was performed                            to 15.5 mm,resulting in improvement of lumen                            narrowing and a single mucosal wrent.  One tongue of salmon-colored mucosa was present                            from 37 to 40 cm. No other visible abnormalities                            were present. The maximum longitudinal extent of                            these esophageal mucosal changes was 3 cm in                            length. Biopsies were taken with a cold forceps for                            histology.                           A 3-4 cm hiatal hernia was present.                           The stomach was normal.                           The cardia and gastric fundus were normal on                            retroflexion.                           The examined duodenum was normal. Complications:            No immediate complications. Estimated Blood Loss:     Estimated blood loss was minimal. 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                           - Benign-appearing esophageal stenosis. Dilated.                           - Salmon-colored mucosa suspicious for                             short-segment Barrett's esophagus. Biopsied.                           - 4 cm hiatal hernia.                           - Normal stomach.                           - Normal examined duodenum. Recommendation:           - Patient has a contact number available for  emergencies. The signs and symptoms of potential                            delayed complications were discussed with the                            patient. Return to normal activities tomorrow.                            Written discharge instructions were provided to the                            patient.                           - Clear liquid diet for 4 hours.                           - Soft diet for 1 day.                           - Continue present medications.                           - Await pathology results.                           - Repeat upper endoscopy in 4 weeks to evaluate the                            response to therapy, further insepction of                            suspected Barrett's mucosa, and perform repeat                            dilation if needed. Henry L. Loletha Carrow, MD 05/25/2018 3:00:01 PM This report has been signed electronically.

## 2018-05-25 NOTE — Progress Notes (Signed)
Called to room to assist during endoscopic procedure.  Patient ID and intended procedure confirmed with present staff. Received instructions for my participation in the procedure from the performing physician.  

## 2018-05-25 NOTE — Patient Instructions (Addendum)
Clear liquids for 4 hours, then advance to soft diet for 24 hours, then resume regular diet.  Continue pantoprazole twice daily.  We will contact you with biopsy results and plan for repeat upper endoscopy in about 4 weeks.  **Handouts given on Strictures and Dilation diet**  YOU HAD AN ENDOSCOPIC PROCEDURE TODAY: Refer to the procedure report and other information in the discharge instructions given to you for any specific questions about what was found during the examination. If this information does not answer your questions, please call [facility] office at [phone] to clarify.   YOU SHOULD EXPECT: Some feelings of bloating in the abdomen. Passage of more gas than usual. Walking can help get rid of the air that was put into your GI tract during the procedure and reduce the bloating. If you had a lower endoscopy (such as a colonoscopy or flexible sigmoidoscopy) you may notice spotting of blood in your stool or on the toilet paper. Some abdominal soreness may be present for a day or two, also.  DIET: Your first meal following the procedure should be a light meal and then it is ok to progress to your normal diet. A half-sandwich or bowl of soup is an example of a good first meal. Heavy or fried foods are harder to digest and may make you feel nauseous or bloated. Drink plenty of fluids but you should avoid alcoholic beverages for 24 hours. If you had a esophageal dilation, please see attached instructions for diet.    ACTIVITY: Your care partner should take you home directly after the procedure. You should plan to take it easy, moving slowly for the rest of the day. You can resume normal activity the day after the procedure however YOU SHOULD NOT DRIVE, use power tools, machinery or perform tasks that involve climbing or major physical exertion for 24 hours (because of the sedation medicines used during the test).   SYMPTOMS TO REPORT IMMEDIATELY: A gastroenterologist can be reached at any hour.  Please call [phone]  for any of the following symptoms:   Following upper endoscopy (EGD, EUS, ERCP, esophageal dilation) Vomiting of blood or coffee ground material  New, significant abdominal pain  New, significant chest pain or pain under the shoulder blades  Painful or persistently difficult swallowing  New shortness of breath  Black, tarry-looking or red, bloody stools  FOLLOW UP:  If any biopsies were taken you will be contacted by phone or by letter within the next 1-3 weeks. Call [phone]  if you have not heard about the biopsies in 3 weeks.  Please also call with any specific questions about appointments or follow up tests.

## 2018-05-25 NOTE — Progress Notes (Signed)
Charted in chart in error. sb

## 2018-05-28 ENCOUNTER — Telehealth: Payer: Self-pay | Admitting: *Deleted

## 2018-05-28 NOTE — Telephone Encounter (Signed)
  Follow up Call-  Call back number 05/25/2018  Post procedure Call Back phone  # [phone]  Permission to leave phone message Yes  Some recent data might be hidden     Patient questions:  Do you have a fever, pain , or abdominal swelling? No. Pain Score  0 *  Have you tolerated food without any problems? Yes.    Have you been able to return to your normal activities? Yes.    Do you have any questions about your discharge instructions: Diet   No. Medications  No. Follow up visit  No.  Do you have questions or concerns about your Care? No.  Actions: * If pain score is 4 or above: No action needed, pain <4.

## 2018-06-04 ENCOUNTER — Encounter: Payer: Self-pay | Admitting: Family Medicine

## 2018-06-07 ENCOUNTER — Other Ambulatory Visit: Payer: Self-pay

## 2018-06-07 ENCOUNTER — Ambulatory Visit (AMBULATORY_SURGERY_CENTER): Payer: Medicare Other | Admitting: *Deleted

## 2018-06-07 VITALS — Ht 68.0 in | Wt 216.0 lb

## 2018-06-07 DIAGNOSIS — R1310 Dysphagia, unspecified: Secondary | ICD-10-CM

## 2018-06-07 NOTE — Progress Notes (Signed)
No egg or soy allergy known to patient  No issues with past sedation with any surgeries  or procedures, no intubation problems  No diet pills per patient No home 02 use per patient  No blood thinners per patient  Pt denies issues with constipation  No A fib or A flutter  EMMI video offered and declined by the patient. 

## 2018-06-19 ENCOUNTER — Encounter: Payer: No Typology Code available for payment source | Admitting: Gastroenterology

## 2018-06-19 ENCOUNTER — Encounter: Payer: Self-pay | Admitting: *Deleted

## 2018-06-26 ENCOUNTER — Encounter: Payer: Self-pay | Admitting: Gastroenterology

## 2018-06-26 ENCOUNTER — Ambulatory Visit (AMBULATORY_SURGERY_CENTER): Payer: Medicare Other | Admitting: Gastroenterology

## 2018-06-26 VITALS — BP 152/64 | HR 64 | Temp 96.9°F | Resp 12 | Ht 68.0 in | Wt 216.0 lb

## 2018-06-26 DIAGNOSIS — I10 Essential (primary) hypertension: Secondary | ICD-10-CM | POA: Diagnosis not present

## 2018-06-26 DIAGNOSIS — K222 Esophageal obstruction: Secondary | ICD-10-CM

## 2018-06-26 DIAGNOSIS — K449 Diaphragmatic hernia without obstruction or gangrene: Secondary | ICD-10-CM | POA: Diagnosis not present

## 2018-06-26 DIAGNOSIS — R1310 Dysphagia, unspecified: Secondary | ICD-10-CM | POA: Diagnosis not present

## 2018-06-26 DIAGNOSIS — K5710 Diverticulosis of small intestine without perforation or abscess without bleeding: Secondary | ICD-10-CM | POA: Diagnosis not present

## 2018-06-26 DIAGNOSIS — K219 Gastro-esophageal reflux disease without esophagitis: Secondary | ICD-10-CM | POA: Diagnosis not present

## 2018-06-26 HISTORY — PX: ESOPHAGOGASTRODUODENOSCOPY (EGD) WITH ESOPHAGEAL DILATION: SHX5812

## 2018-06-26 MED ORDER — OMEPRAZOLE 40 MG PO CPDR: 40.0000 mg | DELAYED_RELEASE_CAPSULE | Freq: Every day | ORAL | 11 refills | Status: DC

## 2018-06-26 MED ORDER — SODIUM CHLORIDE 0.9 % IV SOLN: 500.0000 mL | Freq: Once | INTRAVENOUS | Status: DC

## 2018-06-26 NOTE — Progress Notes (Signed)
Report given to PACU, vss 

## 2018-06-26 NOTE — Progress Notes (Signed)
Pt's states no medical or surgical changes since previsit or office visit. 

## 2018-06-26 NOTE — Progress Notes (Signed)
Called to room to assist during endoscopic procedure.  Patient ID and intended procedure confirmed with present staff. Received instructions for my participation in the procedure from the performing physician.  

## 2018-06-26 NOTE — Op Note (Signed)
[facility] Patient Name: Matthew [name] Procedure Date: 06/26/2018 9:37 AM MRN: [id number] Endoscopist: Mallie Mussel L. Loletha Carrow , MD Age: 67 Referring MD:  Date of Birth: 06-09-1951 Gender: Male Account #: [id number] Procedure:                Upper GI endoscopy Indications:              Dysphagia, For therapy of esophageal stricture                            (discovered and dilation performed last month) Medicines:                Monitored Anesthesia Care Procedure:                Pre-Anesthesia Assessment:                           - Prior to the procedure, a History and Physical                            was performed, and patient medications and                            allergies were reviewed. The patient's tolerance of                            previous anesthesia was also reviewed. The risks                            and benefits of the procedure and the sedation                            options and risks were discussed with the patient.                            All questions were answered, and informed consent                            was obtained. Prior Anticoagulants: The patient has                            taken no previous anticoagulant or antiplatelet                            agents. ASA Grade Assessment: II - A patient with                            mild systemic disease. After reviewing the risks                            and benefits, the patient was deemed in                            satisfactory condition to undergo the procedure.  After obtaining informed consent, the endoscope was                            passed under direct vision. Throughout the                            procedure, the patient's blood pressure, pulse, and                            oxygen saturations were monitored continuously. The                            Endoscope was introduced through the mouth, and                            advanced to the  second part of duodenum. The upper                            GI endoscopy was accomplished without difficulty.                            The patient tolerated the procedure well. Scope In: Scope Out: Findings:                 A diverticulum with a large opening was found in                            the middle third of the esophagus.                           One tongue of salmon-colored mucosa was present at                            36 cm. No other visible abnormalities were present.                            The maximum longitudinal extent of these esophageal                            mucosal changes was 2 cm in length. Biopsies were                            taken with a cold forceps for histology.                           A 3 cm hiatal hernia was present.                           One benign-appearing, intrinsic moderate stenosis                            was found 33 to 36 cm from the incisors. This  stenosis measured 1.2 cm (inner diameter) x 3 cm                            (in length). The stenosis was traversed. A TTS                            dilator was passed through the scope. Dilation with                            a 13.5-14.5-15.5 mm balloon dilator was performed                            to 15.5 mm. The dilation site was examined and                            showed moderate mucosal disruption and moderate                            improvement in luminal narrowing (with a single                            mucosal wrent).                           The stomach was normal.                           The cardia and gastric fundus were normal on                            retroflexion.                           The examined duodenum was normal. Complications:            No immediate complications. Estimated Blood Loss:     Estimated blood loss was minimal. 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                            - Salmon-colored mucosa suspicious for                            short-segment Barrett's esophagus. Biopsied.                           - 3 cm hiatal hernia.                           - Benign-appearing esophageal stenosis. Dilated.                           - Normal stomach.                           -  Normal examined duodenum. Recommendation:           - Patient has a contact number available for                            emergencies. The signs and symptoms of potential                            delayed complications were discussed with the                            patient. Return to normal activities tomorrow.                            Written discharge instructions were provided to the                            patient.                           - Resume previous diet.                           - Continue present medications.                           - Await pathology results.                           - Repeat upper endoscopy as needed for dysphagia.                           - Use Prilosec (omeprazole) 40 mg PO daily                            indefinitely. Disp #30, RF 11 Wenzel Backlund L. Loletha Carrow, MD 06/26/2018 10:11:34 AM This report has been signed electronically.

## 2018-06-26 NOTE — Patient Instructions (Signed)
Please read handouts provided. Follow instructions for Dilation Diet. Begin Prilosec 40 mg daily indefinitely. Continue present medications. Await pathology results.     YOU HAD AN ENDOSCOPIC PROCEDURE TODAY AT [facility]:   Refer to the procedure report that was given to you for any specific questions about what was found during the examination.  If the procedure report does not answer your questions, please call your gastroenterologist to clarify.  If you requested that your care partner not be given the details of your procedure findings, then the procedure report has been included in a sealed envelope for you to review at your convenience later.  YOU SHOULD EXPECT: Some feelings of bloating in the abdomen. Passage of more gas than usual.  Walking can help get rid of the air that was put into your GI tract during the procedure and reduce the bloating. If you had a lower endoscopy (such as a colonoscopy or flexible sigmoidoscopy) you may notice spotting of blood in your stool or on the toilet paper. If you underwent a bowel prep for your procedure, you may not have a normal bowel movement for a few days.  Please Note:  You might notice some irritation and congestion in your nose or some drainage.  This is from the oxygen used during your procedure.  There is no need for concern and it should clear up in a day or so.  SYMPTOMS TO REPORT IMMEDIATELY:     Following upper endoscopy (EGD)  Vomiting of blood or coffee ground material  New chest pain or pain under the shoulder blades  Painful or persistently difficult swallowing  New shortness of breath  Fever of 100F or higher  Black, tarry-looking stools  For urgent or emergent issues, a gastroenterologist can be reached at any hour by calling [phone].   DIET:   Drink plenty of fluids but you should avoid alcoholic beverages for 24 hours.  ACTIVITY:  You should plan to take it easy for the rest of today and you  should NOT DRIVE or use heavy machinery until tomorrow (because of the sedation medicines used during the test).    FOLLOW UP: Our staff will call the number listed on your records the next business day following your procedure to check on you and address any questions or concerns that you may have regarding the information given to you following your procedure. If we do not reach you, we will leave a message.  However, if you are feeling well and you are not experiencing any problems, there is no need to return our call.  We will assume that you have returned to your regular daily activities without incident.  If any biopsies were taken you will be contacted by phone or by letter within the next 1-3 weeks.  Please call us at [phone] if you have not heard about the biopsies in 3 weeks.    SIGNATURES/CONFIDENTIALITY: You and/or your care partner have signed paperwork which will be entered into your electronic medical record.  These signatures attest to the fact that that the information above on your After Visit Summary has been reviewed and is understood.  Full responsibility of the confidentiality of this discharge information lies with you and/or your care-partner.

## 2018-06-27 ENCOUNTER — Telehealth: Payer: Self-pay | Admitting: *Deleted

## 2018-06-27 ENCOUNTER — Telehealth: Payer: Self-pay | Admitting: Gastroenterology

## 2018-06-27 DIAGNOSIS — R1310 Dysphagia, unspecified: Secondary | ICD-10-CM

## 2018-06-27 DIAGNOSIS — R1319 Other dysphagia: Secondary | ICD-10-CM

## 2018-06-27 NOTE — Telephone Encounter (Signed)
Pt's wife Matthew [name] called to inform that copay for omeprazole is $50 while copay for pantoprazole is $12, She wants to know if pt can continue taking pantoprazole if both medications are for the same purpose. Pls call her.

## 2018-06-27 NOTE — Telephone Encounter (Signed)
  Follow up Call-  Call back number 06/26/2018 05/25/2018  Post procedure Call Back phone  # [phone]  Permission to leave phone message Yes Yes  Some recent data might be hidden     Patient questions:  Do you have a fever, pain , or abdominal swelling? No. Pain Score  0 *  Have you tolerated food without any problems? Yes.    Have you been able to return to your normal activities? Yes.    Do you have any questions about your discharge instructions: Diet   No. Medications  No. Follow up visit  No.  Do you have questions or concerns about your Care? No.  Actions: * If pain score is 4 or above: No action needed, pain <4.

## 2018-06-28 DIAGNOSIS — M461 Sacroiliitis, not elsewhere classified: Secondary | ICD-10-CM | POA: Diagnosis not present

## 2018-06-28 DIAGNOSIS — M5137 Other intervertebral disc degeneration, lumbosacral region: Secondary | ICD-10-CM | POA: Diagnosis not present

## 2018-06-28 DIAGNOSIS — M47817 Spondylosis without myelopathy or radiculopathy, lumbosacral region: Secondary | ICD-10-CM | POA: Diagnosis not present

## 2018-06-28 DIAGNOSIS — G894 Chronic pain syndrome: Secondary | ICD-10-CM | POA: Diagnosis not present

## 2018-06-28 MED ORDER — PANTOPRAZOLE SODIUM 40 MG PO TBEC: 40.0000 mg | DELAYED_RELEASE_TABLET | Freq: Every day | ORAL | 11 refills | Status: DC | PRN

## 2018-06-28 NOTE — Telephone Encounter (Signed)
Pts wife Eustaquio Maize has been notified and aware.

## 2018-06-28 NOTE — Telephone Encounter (Signed)
Please advise 

## 2018-06-28 NOTE — Telephone Encounter (Signed)
Thank you for letting me know.  I have discontinued the omeprazole prescription and refilled the pantoprazole prescription.

## 2018-06-30 ENCOUNTER — Encounter: Payer: Self-pay | Admitting: Gastroenterology

## 2018-07-30 DIAGNOSIS — C61 Malignant neoplasm of prostate: Secondary | ICD-10-CM | POA: Diagnosis not present

## 2018-08-03 DIAGNOSIS — R9721 Rising PSA following treatment for malignant neoplasm of prostate: Secondary | ICD-10-CM | POA: Diagnosis not present

## 2018-08-03 DIAGNOSIS — N5231 Erectile dysfunction following radical prostatectomy: Secondary | ICD-10-CM | POA: Diagnosis not present

## 2018-08-03 DIAGNOSIS — C61 Malignant neoplasm of prostate: Secondary | ICD-10-CM | POA: Diagnosis not present

## 2018-08-21 ENCOUNTER — Other Ambulatory Visit: Payer: Self-pay

## 2018-08-21 ENCOUNTER — Encounter: Payer: Self-pay | Admitting: Family Medicine

## 2018-08-21 ENCOUNTER — Ambulatory Visit (INDEPENDENT_AMBULATORY_CARE_PROVIDER_SITE_OTHER): Payer: Medicare Other | Admitting: Family Medicine

## 2018-08-21 VITALS — BP 134/68 | HR 98 | Temp 98.5°F | Resp 16 | Ht 68.0 in | Wt 221.2 lb

## 2018-08-21 DIAGNOSIS — Z299 Encounter for prophylactic measures, unspecified: Secondary | ICD-10-CM | POA: Diagnosis not present

## 2018-08-21 DIAGNOSIS — Z0000 Encounter for general adult medical examination without abnormal findings: Secondary | ICD-10-CM | POA: Diagnosis not present

## 2018-08-21 DIAGNOSIS — Z23 Encounter for immunization: Secondary | ICD-10-CM

## 2018-08-21 DIAGNOSIS — I10 Essential (primary) hypertension: Secondary | ICD-10-CM | POA: Diagnosis not present

## 2018-08-21 DIAGNOSIS — R7303 Prediabetes: Secondary | ICD-10-CM | POA: Diagnosis not present

## 2018-08-21 DIAGNOSIS — E785 Hyperlipidemia, unspecified: Secondary | ICD-10-CM

## 2018-08-21 DIAGNOSIS — Z0001 Encounter for general adult medical examination with abnormal findings: Secondary | ICD-10-CM

## 2018-08-21 MED ORDER — AMLODIPINE BESYLATE 10 MG PO TABS: 10.0000 mg | ORAL_TABLET | Freq: Every day | ORAL | 3 refills | Status: DC

## 2018-08-21 MED ORDER — ATENOLOL 25 MG PO TABS: 25.0000 mg | ORAL_TABLET | Freq: Every day | ORAL | 3 refills | Status: DC

## 2018-08-21 MED ORDER — ATORVASTATIN CALCIUM 40 MG PO TABS: 40.0000 mg | ORAL_TABLET | Freq: Every day | ORAL | 3 refills | Status: DC

## 2018-08-21 NOTE — Progress Notes (Signed)
Subjective:    Patient ID: Matthew [name], male    DOB: 19-Apr-1951, 68 y.o.   MRN: [id number]  HPI Matthew [name] is a 68 y.o. male Presents today for: Chief Complaint  Patient presents with  . Medicare Wellness    partient stated he is doing fine and do not have any issus at this time. BP has been running in the 140/70 range   Chronic pain disorder: Followed by [facility] - Lilli Few. On flexeril, oxycodone.  . Hypertension: BP Readings from Last 3 Encounters:  08/21/18 134/68  06/26/18 (!) 152/64  05/25/18 (!) 180/88   Lab Results  Component Value Date   CREATININE 1.07 05/17/2018  home readings:132-160/62-86 No new med side effects.  Still taking norvasc 78m qd, atenolol 256mqd.   Hyperglycemia/prediabetes.  Cutting back on ice cream (about a gallon a week), other sweets.  Has been making some improvements.  Wt Readings from Last 3 Encounters:  08/21/18 221 lb 3.2 oz (100.3 kg)  06/26/18 216 lb (98 kg)  06/07/18 216 lb (98 kg)   Lab Results  Component Value Date   HGBA1C 5.7 (H) 05/17/2018   Hyperlipidemia:  Lab Results  Component Value Date   CHOL 168 05/17/2018   HDL 27 (L) 05/17/2018   LDLCALC 74 05/17/2018   TRIG 333 (H) 05/17/2018   CHOLHDL 6.2 (H) 05/17/2018   Lab Results  Component Value Date   ALT 22 05/17/2018   AST 24 05/17/2018   ALKPHOS 82 05/17/2018   BILITOT 0.5 05/17/2018  on lipitor 4064md - no new side effects. No new myalgias.   GERD: Followed by GI- Dr. DanLoletha Carrowndoscopy 06/26/18, on omeprazole. Short segment Barrett's. Benign stenosis that was dilated.  Cancer screening: Colonoscopy 05/05/2016 Followed by [facility] for history of prostate cancer. On elavil finasteride, and casodex.   Immunization History  Administered Date(s) Administered  . Influenza Split 04/15/2013, 05/18/2014, 04/28/2015  . Influenza Whole 05/01/2012  . Influenza-Unspecified 04/07/2016, 03/17/2017  . Pneumococcal Conjugate-13 11/19/2013  .  Pneumococcal Polysaccharide-23 06/19/2012  . Tdap 06/19/2012  . Zoster 12/10/2013   Fall screening, no falls in the past year  [facility] 08/21/2018 05/17/2018 03/01/2018 12/19/2017 12/13/2017  Decreased Interest 0 0 0 0 0  Down, Depressed, Hopeless 0 0 0 0 0  PHQ - 2 Score 0 0 0 0 0    Functional Status Survey: Is the patient deaf or have difficulty hearing?: No Does the patient have difficulty seeing, even when wearing glasses/contacts?: No Does the patient have difficulty concentrating, remembering, or making decisions?: No Does the patient have difficulty walking or climbing stairs?: No Does the patient have difficulty dressing or bathing?: No Does the patient have difficulty doing errands alone such as visiting a doctor's office or shopping?: No  Memory screening: 6CIT Screen 08/21/2018  What Year? 0 points  What month? 0 points  What time? 0 points  Count back from 20 0 points  Months in reverse 0 points  Repeat phrase 0 points  Total Score 0   Vision:  Visual Acuity Screening   Right eye Left eye Both eyes  Without correction: _0  With correction:      Dental: not recently. Some natural teeth.   Exercise: has started walking/situps as weight has increased.   Advanced directives -has living will and healthcare power of attorney    Patient Active Problem List   Diagnosis Date Noted  . Impingement syndrome of right shoulder region  01/18/2018  . Hypertension 07/10/2017  . Pain of both shoulder joints 05/03/2017  . Sleep disturbance 11/15/2012  . Snoring 11/15/2012  . Chronic back pain 11/15/2012  . Cancer ([address]) 02/11/2005   Past Medical History:  Diagnosis Date  . Arthritis   . Cancer ([place]) 02/11/2005   prostate cancer, gleason 3+3=6, vol 30 cc  . Chronic back pain 11/15/2012  . GERD (gastroesophageal reflux disease)   . Gout   . History of shingles   . Hx of radiation therapy 08/04/05 to 09/22/2005   prostate bed  .  Hypercholesteremia   . Hypertension   . Sleep disturbance 11/15/2012  . Snoring 11/15/2012   Past Surgical History:  Procedure Laterality Date  . COLONOSCOPY    . EYE SURGERY    . HERNIA REPAIR  05/23/12   RIH  . NECK SURGERY    . PROSTATECTOMY  03/2005   Gleason 3+4=7  . UPPER GASTROINTESTINAL ENDOSCOPY     No Known Allergies Prior to Admission medications   Medication Sig Start Date End Date Taking? Authorizing Provider  amitriptyline (ELAVIL) 25 MG tablet Take 25 mg by mouth at bedtime.   Yes [provider]  amLODipine (NORVASC) 10 MG tablet Take 1 tablet (10 mg total) by mouth daily. 08/16/17  Yes Tereasa Coop, PA-C  atenolol (TENORMIN) 25 MG tablet Take 1 tablet (25 mg total) by mouth daily. 08/16/17  Yes Tereasa Coop, PA-C  atorvastatin (LIPITOR) 40 MG tablet Take 1 tablet (40 mg total) by mouth daily. 08/16/17  Yes Tereasa Coop, PA-C  bicalutamide (CASODEX) 50 MG tablet Take 50 mg by mouth daily.   Yes [provider]  clotrimazole-betamethasone (LOTRISONE) cream Apply 1 application topically 2 (two) times daily. 05/17/18  Yes Wendie Agreste, MD  cyclobenzaprine (FLEXERIL) 10 MG tablet Take 10 mg by mouth 3 (three) times daily as needed. 05/22/17  Yes [provider]  finasteride (PROSCAR) 5 MG tablet Take 5 mg by mouth daily.   Yes [provider]  mupirocin ointment (BACTROBAN) 2 % Apply 1 application topically 2 (two) times daily. 12/13/17  Yes Tereasa Coop, PA-C  oxyCODONE-acetaminophen (PERCOCET) 10-325 MG tablet TK 1 T PO Q 6 H PRF PAIN 10/12/17  Yes [provider]  pantoprazole (PROTONIX) 40 MG tablet Take 1 tablet (40 mg total) by mouth daily as needed. 06/28/18  Yes Danis, Kirke Corin, MD   Social History   Socioeconomic History  . Marital status: Married    Spouse name: Not on file  . Number of children: 1  . Years of education: Not on file  . Highest education level: Not on file  Occupational History  .  Not on file  Social Needs  . Financial resource strain: Not on file  . Food insecurity:    Worry: Not on file    Inability: Not on file  . Transportation needs:    Medical: Not on file    Non-medical: Not on file  Tobacco Use  . Smoking status: Never Smoker  . Smokeless tobacco: Current User    Types: Chew, Snuff  . Tobacco comment: 50 + yrs started chewing tobacco at 68 yrs old  Substance and Sexual Activity  . Alcohol use: No  . Drug use: No  . Sexual activity: Not on file  Lifestyle  . Physical activity:    Days per week: Not on file    Minutes per session: Not on file  . Stress: Not on file  Relationships  . Social connections:    Talks on phone: Not on file    Gets together: Not on file    Attends religious service: Not on file    Active member of club or organization: Not on file    Attends meetings of clubs or organizations: Not on file    Relationship status: Not on file  . Intimate partner violence:    Fear of current or ex partner: Not on file    Emotionally abused: Not on file    Physically abused: Not on file    Forced sexual activity: Not on file  Other Topics Concern  . Not on file  Social History Narrative   Married, 1 son, pipe fitter    Review of Systems 13 point review of systems per patient health survey noted.  Negative other than as indicated above or in HPI.      Objective:   Physical Exam Vitals signs reviewed.  Constitutional:      Appearance: He is well-developed.  HENT:     Head: Normocephalic and atraumatic.     Right Ear: External ear normal.     Left Ear: External ear normal.  Eyes:     Conjunctiva/sclera: Conjunctivae normal.     Pupils: Pupils are equal, round, and reactive to light.  Neck:     Musculoskeletal: Normal range of motion and neck supple.     Thyroid: No thyromegaly.  Cardiovascular:     Rate and Rhythm: Normal rate and regular rhythm.     Heart sounds: Normal heart sounds.     Comments: Trace LE edema at  ankles.  Pulmonary:     Effort: Pulmonary effort is normal. No respiratory distress.     Breath sounds: Normal breath sounds. No wheezing.  Abdominal:     General: There is no distension.     Palpations: Abdomen is soft.     Tenderness: There is no abdominal tenderness.  Musculoskeletal: Normal range of motion.        General: No tenderness.  Lymphadenopathy:     Cervical: No cervical adenopathy.  Skin:    General: Skin is warm and dry.  Neurological:     Mental Status: He is alert and oriented to person, place, and time.     Deep Tendon Reflexes: Reflexes are normal and symmetric.  Psychiatric:        Behavior: Behavior normal.     Vitals:   08/21/18 1329  BP: 134/68  Pulse: 98  Resp: 16  Temp: 98.5 F (36.9 C)  TempSrc: Oral  SpO2: 97%  Weight: 221 lb 3.2 oz (100.3 kg)  Height: _0  (1.727 m)       Assessment & Plan:   Matthew [name] is a 68 y.o. male Encounter for Medicare annual wellness exam - Plan: Comprehensive metabolic panel, Lipid Panel  - - anticipatory guidance as below in AVS, screening labs if needed. Health maintenance items as above in HPI discussed/recommended as applicable.  - no concerning responses on depression, fall, or functional status screening. Any positive responses noted as above. Advanced directives discussed as in CH[facility].   Prediabetes - Plan: Hemoglobin A1c  - importance of diet and activity discussed. Check A1c.   Need for prophylactic measure - Plan: Pneumococcal polysaccharide vaccine 23-valent greater than or equal to 2yo subcutaneous/IM Need for prophylactic vaccination against Streptococcus pneumoniae (pneumococcus) - Plan: Pneumococcal polysaccharide vaccine 23-valent greater than or equal to 2yo subcutaneous/IM  - pneumovax given.   Essential hypertension -  Plan: amLODipine (NORVASC) 10 MG tablet, atenolol (TENORMIN) 25 MG tablet  - higher readings at home. Ok in office. Handout given on checking BP effectively. No meds changes for  now, labs pending.   Dyslipidemia - Plan: atorvastatin (LIPITOR) 40 MG tablet  -  Stable, tolerating current regimen. Medications refilled. Labs pending as above.    No orders of the defined types were placed in this encounter.  Patient Instructions     Blood pressure looks okay here in the office.  See information below on how to check blood pressure most effectively at home.  If you get readings in the 150s or 160 please return with your home meter to make sure we do not need to change medications.  Recheck in 6 months to review medications.  Thank you for coming in today.    How to Take Your Blood Pressure Blood pressure is a measurement of how strongly your blood is pressing against the walls of your arteries. Arteries are blood vessels that carry blood from your heart throughout your body. Your health care provider takes your blood pressure at each office visit. You can also take your own blood pressure at home with a blood pressure machine. You may need to take your own blood pressure:  To confirm a diagnosis of high blood pressure (hypertension).  To monitor your blood pressure over time.  To make sure your blood pressure medicine is working. Supplies needed: To take your blood pressure, you will need a blood pressure machine. You can buy a blood pressure machine, or blood pressure monitor, at most drugstores or online. There are several types of home blood pressure monitors. When choosing one, consider the following:  Choose a monitor that has an arm cuff.  Choose a cuff that wraps snugly around your upper arm. You should be able to fit only one finger between your arm and the cuff.  Do not choose a monitor that measures your blood pressure from your wrist or finger. Your health care provider can suggest a reliable monitor that will meet your needs. How to prepare To get the most accurate reading, avoid the following for 30 minutes before you check your blood  pressure:  Drinking caffeine.  Drinking alcohol.  Eating.  Smoking.  Exercising. Five minutes before you check your blood pressure:  Empty your bladder.  Sit quietly without talking in a dining chair, rather than in a soft couch or armchair. How to take your blood pressure To check your blood pressure, follow the instructions in the manual that came with your blood pressure monitor. If you have a digital blood pressure monitor, the instructions may be as follows: 1. Sit up straight. 2. Place your feet on the floor. Do not cross your ankles or legs. 3. Rest your left arm at the level of your heart on a table or desk or on the arm of a chair. 4. Pull up your shirt sleeve. 5. Wrap the blood pressure cuff around the upper part of your left arm, 1 inch (2.5 cm) above your elbow. It is best to wrap the cuff around bare skin. 6. Fit the cuff snugly around your arm. You should be able to place only one finger between the cuff and your arm. 7. Position the cord inside the groove of your elbow. 8. Press the power button. 9. Sit quietly while the cuff inflates and deflates. 10. Read the digital reading on the monitor screen and write it down (record it). 11. Wait 2-3 minutes,  then repeat the steps, starting at step 1. What does my blood pressure reading mean? A blood pressure reading consists of a higher number over a lower number. Ideally, your blood pressure should be below 120/80. The first ("top") number is called the systolic pressure. It is a measure of the pressure in your arteries as your heart beats. The second ("bottom") number is called the diastolic pressure. It is a measure of the pressure in your arteries as the heart relaxes. Blood pressure is classified into four stages. The following are the stages for adults who do not have a short-term serious illness or a chronic condition. Systolic pressure and diastolic pressure are measured in a unit called mm Hg. Normal  Systolic  pressure: below 120.  Diastolic pressure: below 80. Elevated  Systolic pressure: 938-182.  Diastolic pressure: below 80. Hypertension stage 1  Systolic pressure: 993-716.  Diastolic pressure: 96-78. Hypertension stage 2  Systolic pressure: 938 or above.  Diastolic pressure: 90 or above. You can have prehypertension or hypertension even if only the systolic or only the diastolic number in your reading is higher than normal. Follow these instructions at home:  Check your blood pressure as often as recommended by your health care provider.  Take your monitor to the next appointment with your health care provider to make sure: ? That you are using it correctly. ? That it provides accurate readings.  Be sure you understand what your goal blood pressure numbers are.  Tell your health care provider if you are having any side effects from blood pressure medicine. Contact a health care provider if:  Your blood pressure is consistently high. Get help right away if:  Your systolic blood pressure is higher than 180.  Your diastolic blood pressure is higher than 110. This information is not intended to replace advice given to you by your health care provider. Make sure you discuss any questions you have with your health care provider. Document Released: 12/18/2015 Document Revised: 05/23/2017 Document Reviewed: 12/18/2015 Elsevier Interactive Patient Education  2019 [place] 65 Years and Older, Male Preventive care refers to lifestyle choices and visits with your health care provider that can promote health and wellness. What does preventive care include?   A yearly physical exam. This is also called an annual well check.  Dental exams once or twice a year.  Routine eye exams. Ask your health care provider how often you should have your eyes checked.  Personal lifestyle choices, including: ? Daily care of your teeth and gums. ? Regular physical  activity. ? Eating a healthy diet. ? Avoiding tobacco and drug use. ? Limiting alcohol use. ? Practicing safe sex. ? Taking low doses of aspirin every day. ? Taking vitamin and mineral supplements as recommended by your health care provider. What happens during an annual well check? The services and screenings done by your health care provider during your annual well check will depend on your age, overall health, lifestyle risk factors, and family history of disease. Counseling Your health care provider may ask you questions about your:  Alcohol use.  Tobacco use.  Drug use.  Emotional well-being.  Home and relationship well-being.  Sexual activity.  Eating habits.  History of falls.  Memory and ability to understand (cognition).  Work and work Statistician. Screening You may have the following tests or measurements:  Height, weight, and BMI.  Blood pressure.  Lipid and cholesterol levels. These may be checked every 5 years, or more  frequently if you are over 57 years old.  Skin check.  Lung cancer screening. You may have this screening every year starting at age 76 if you have a 30-pack-year history of smoking and currently smoke or have quit within the past 15 years.  Colorectal cancer screening. All adults should have this screening starting at age 23 and continuing until age 4. You will have tests every 1-10 years, depending on your results and the type of screening test. People at increased risk should start screening at an earlier age. Screening tests may include: ? Guaiac-based fecal occult blood testing. ? Fecal immunochemical test (FIT). ? Stool DNA test. ? Virtual colonoscopy. ? Sigmoidoscopy. During this test, a flexible tube with a tiny camera (sigmoidoscope) is used to examine your rectum and lower colon. The sigmoidoscope is inserted through your anus into your rectum and lower colon. ? Colonoscopy. During this test, a long, thin, flexible tube with a  tiny camera (colonoscope) is used to examine your entire colon and rectum.  Prostate cancer screening. Recommendations will vary depending on your family history and other risks.  Hepatitis C blood test.  Hepatitis B blood test.  Sexually transmitted disease (STD) testing.  Diabetes screening. This is done by checking your blood sugar (glucose) after you have not eaten for a while (fasting). You may have this done every 1-3 years.  Abdominal aortic aneurysm (AAA) screening. You may need this if you are a current or former smoker.  Osteoporosis. You may be screened starting at age 15 if you are at high risk. Talk with your health care provider about your test results, treatment options, and if necessary, the need for more tests. Vaccines Your health care provider may recommend certain vaccines, such as:  Influenza vaccine. This is recommended every year.  Tetanus, diphtheria, and acellular pertussis (Tdap, Td) vaccine. You may need a Td booster every 10 years.  Varicella vaccine. You may need this if you have not been vaccinated.  Zoster vaccine. You may need this after age 44.  Measles, mumps, and rubella (MMR) vaccine. You may need at least one dose of MMR if you were born in 1957 or later. You may also need a second dose.  Pneumococcal 13-valent conjugate (PCV13) vaccine. One dose is recommended after age 4.  Pneumococcal polysaccharide (PPSV23) vaccine. One dose is recommended after age 40.  Meningococcal vaccine. You may need this if you have certain conditions.  Hepatitis A vaccine. You may need this if you have certain conditions or if you travel or work in places where you may be exposed to hepatitis A.  Hepatitis B vaccine. You may need this if you have certain conditions or if you travel or work in places where you may be exposed to hepatitis B.  Haemophilus influenzae type b (Hib) vaccine. You may need this if you have certain risk factors. Talk to your health care  provider about which screenings and vaccines you need and how often you need them. This information is not intended to replace advice given to you by your health care provider. Make sure you discuss any questions you have with your health care provider. Document Released: 08/07/2015 Document Revised: 08/31/2017 Document Reviewed: 05/12/2015 Elsevier Interactive Patient Education  [employer].  If you have lab work done today you will be contacted with your lab results within the next 2 weeks.  If you have not heard from [place] then please contact us. The fastest way to get your results is to register  for My Chart.   IF you received an x-ray today, you will receive an invoice from [facility]. Please contact [facility] at [phone] with questions or concerns regarding your invoice.   IF you received labwork today, you will receive an invoice from [place]. Please contact [facility] at [phone] with questions or concerns regarding your invoice.   Our billing staff will not be able to assist you with questions regarding bills from these companies.  You will be contacted with the lab results as soon as they are available. The fastest way to get your results is to activate your My Chart account. Instructions are located on the last page of this paperwork. If you have not heard from [place] regarding the results in 2 weeks, please contact this office.       Signed,   Merri Ray, MD [facility].  08/21/18 2:06 PM

## 2018-08-21 NOTE — Patient Instructions (Addendum)
Blood pressure looks okay here in the office.  See information below on how to check blood pressure most effectively at home.  If you get readings in the 150s or 160 please return with your home meter to make sure we do not need to change medications.  Recheck in 6 months to review medications.  Thank you for coming in today.    How to Take Your Blood Pressure Blood pressure is a measurement of how strongly your blood is pressing against the walls of your arteries. Arteries are blood vessels that carry blood from your heart throughout your body. Your health care provider takes your blood pressure at each office visit. You can also take your own blood pressure at home with a blood pressure machine. You may need to take your own blood pressure:  To confirm a diagnosis of high blood pressure (hypertension).  To monitor your blood pressure over time.  To make sure your blood pressure medicine is working. Supplies needed: To take your blood pressure, you will need a blood pressure machine. You can buy a blood pressure machine, or blood pressure monitor, at most drugstores or online. There are several types of home blood pressure monitors. When choosing one, consider the following:  Choose a monitor that has an arm cuff.  Choose a cuff that wraps snugly around your upper arm. You should be able to fit only one finger between your arm and the cuff.  Do not choose a monitor that measures your blood pressure from your wrist or finger. Your health care provider can suggest a reliable monitor that will meet your needs. How to prepare To get the most accurate reading, avoid the following for 30 minutes before you check your blood pressure:  Drinking caffeine.  Drinking alcohol.  Eating.  Smoking.  Exercising. Five minutes before you check your blood pressure:  Empty your bladder.  Sit quietly without talking in a dining chair, rather than in a soft couch or armchair. How to take your  blood pressure To check your blood pressure, follow the instructions in the manual that came with your blood pressure monitor. If you have a digital blood pressure monitor, the instructions may be as follows: 1. Sit up straight. 2. Place your feet on the floor. Do not cross your ankles or legs. 3. Rest your left arm at the level of your heart on a table or desk or on the arm of a chair. 4. Pull up your shirt sleeve. 5. Wrap the blood pressure cuff around the upper part of your left arm, 1 inch (2.5 cm) above your elbow. It is best to wrap the cuff around bare skin. 6. Fit the cuff snugly around your arm. You should be able to place only one finger between the cuff and your arm. 7. Position the cord inside the groove of your elbow. 8. Press the power button. 9. Sit quietly while the cuff inflates and deflates. 10. Read the digital reading on the monitor screen and write it down (record it). 11. Wait 2-3 minutes, then repeat the steps, starting at step 1. What does my blood pressure reading mean? A blood pressure reading consists of a higher number over a lower number. Ideally, your blood pressure should be below 120/80. The first ("top") number is called the systolic pressure. It is a measure of the pressure in your arteries as your heart beats. The second ("bottom") number is called the diastolic pressure. It is a measure of the pressure in your  arteries as the heart relaxes. Blood pressure is classified into four stages. The following are the stages for adults who do not have a short-term serious illness or a chronic condition. Systolic pressure and diastolic pressure are measured in a unit called mm Hg. Normal  Systolic pressure: below 124.  Diastolic pressure: below 80. Elevated  Systolic pressure: 580-998.  Diastolic pressure: below 80. Hypertension stage 1  Systolic pressure: 338-250.  Diastolic pressure: 53-97. Hypertension stage 2  Systolic pressure: 673 or above.  Diastolic  pressure: 90 or above. You can have prehypertension or hypertension even if only the systolic or only the diastolic number in your reading is higher than normal. Follow these instructions at home:  Check your blood pressure as often as recommended by your health care provider.  Take your monitor to the next appointment with your health care provider to make sure: ? That you are using it correctly. ? That it provides accurate readings.  Be sure you understand what your goal blood pressure numbers are.  Tell your health care provider if you are having any side effects from blood pressure medicine. Contact a health care provider if:  Your blood pressure is consistently high. Get help right away if:  Your systolic blood pressure is higher than 180.  Your diastolic blood pressure is higher than 110. This information is not intended to replace advice given to you by your health care provider. Make sure you discuss any questions you have with your health care provider. Document Released: 12/18/2015 Document Revised: 05/23/2017 Document Reviewed: 12/18/2015 Elsevier Interactive Patient Education  2019 [place] 69 Years and Older, Male Preventive care refers to lifestyle choices and visits with your health care provider that can promote health and wellness. What does preventive care include?   A yearly physical exam. This is also called an annual well check.  Dental exams once or twice a year.  Routine eye exams. Ask your health care provider how often you should have your eyes checked.  Personal lifestyle choices, including: ? Daily care of your teeth and gums. ? Regular physical activity. ? Eating a healthy diet. ? Avoiding tobacco and drug use. ? Limiting alcohol use. ? Practicing safe sex. ? Taking low doses of aspirin every day. ? Taking vitamin and mineral supplements as recommended by your health care provider. What happens during an annual well  check? The services and screenings done by your health care provider during your annual well check will depend on your age, overall health, lifestyle risk factors, and family history of disease. Counseling Your health care provider may ask you questions about your:  Alcohol use.  Tobacco use.  Drug use.  Emotional well-being.  Home and relationship well-being.  Sexual activity.  Eating habits.  History of falls.  Memory and ability to understand (cognition).  Work and work Statistician. Screening You may have the following tests or measurements:  Height, weight, and BMI.  Blood pressure.  Lipid and cholesterol levels. These may be checked every 5 years, or more frequently if you are over 16 years old.  Skin check.  Lung cancer screening. You may have this screening every year starting at age 47 if you have a 30-pack-year history of smoking and currently smoke or have quit within the past 15 years.  Colorectal cancer screening. All adults should have this screening starting at age 82 and continuing until age 50. You will have tests every 1-10 years, depending on your results and the  type of screening test. People at increased risk should start screening at an earlier age. Screening tests may include: ? Guaiac-based fecal occult blood testing. ? Fecal immunochemical test (FIT). ? Stool DNA test. ? Virtual colonoscopy. ? Sigmoidoscopy. During this test, a flexible tube with a tiny camera (sigmoidoscope) is used to examine your rectum and lower colon. The sigmoidoscope is inserted through your anus into your rectum and lower colon. ? Colonoscopy. During this test, a long, thin, flexible tube with a tiny camera (colonoscope) is used to examine your entire colon and rectum.  Prostate cancer screening. Recommendations will vary depending on your family history and other risks.  Hepatitis C blood test.  Hepatitis B blood test.  Sexually transmitted disease (STD)  testing.  Diabetes screening. This is done by checking your blood sugar (glucose) after you have not eaten for a while (fasting). You may have this done every 1-3 years.  Abdominal aortic aneurysm (AAA) screening. You may need this if you are a current or former smoker.  Osteoporosis. You may be screened starting at age 41 if you are at high risk. Talk with your health care provider about your test results, treatment options, and if necessary, the need for more tests. Vaccines Your health care provider may recommend certain vaccines, such as:  Influenza vaccine. This is recommended every year.  Tetanus, diphtheria, and acellular pertussis (Tdap, Td) vaccine. You may need a Td booster every 10 years.  Varicella vaccine. You may need this if you have not been vaccinated.  Zoster vaccine. You may need this after age 49.  Measles, mumps, and rubella (MMR) vaccine. You may need at least one dose of MMR if you were born in 1957 or later. You may also need a second dose.  Pneumococcal 13-valent conjugate (PCV13) vaccine. One dose is recommended after age 30.  Pneumococcal polysaccharide (PPSV23) vaccine. One dose is recommended after age 8.  Meningococcal vaccine. You may need this if you have certain conditions.  Hepatitis A vaccine. You may need this if you have certain conditions or if you travel or work in places where you may be exposed to hepatitis A.  Hepatitis B vaccine. You may need this if you have certain conditions or if you travel or work in places where you may be exposed to hepatitis B.  Haemophilus influenzae type b (Hib) vaccine. You may need this if you have certain risk factors. Talk to your health care provider about which screenings and vaccines you need and how often you need them. This information is not intended to replace advice given to you by your health care provider. Make sure you discuss any questions you have with your health care provider. Document  Released: 08/07/2015 Document Revised: 08/31/2017 Document Reviewed: 05/12/2015 Elsevier Interactive Patient Education  [employer].  If you have lab work done today you will be contacted with your lab results within the next 2 weeks.  If you have not heard from [place] then please contact us. The fastest way to get your results is to register for My Chart.   IF you received an x-ray today, you will receive an invoice from [facility]. Please contact [facility] at [phone] with questions or concerns regarding your invoice.   IF you received labwork today, you will receive an invoice from [place]. Please contact [facility] at [phone] with questions or concerns regarding your invoice.   Our billing staff will not be able to assist you with questions regarding bills from these companies.  You will be contacted with the lab results as soon as they are available. The fastest way to get your results is to activate your My Chart account. Instructions are located on the last page of this paperwork. If you have not heard from us regarding the results in 2 weeks, please contact this office.      

## 2018-08-24 LAB — HEMOGLOBIN A1C
Est. average glucose Bld gHb Est-mCnc: 117 mg/dL
Hgb A1c MFr Bld: 5.7 % — ABNORMAL HIGH (ref 4.8–5.6)

## 2018-08-27 DIAGNOSIS — M47817 Spondylosis without myelopathy or radiculopathy, lumbosacral region: Secondary | ICD-10-CM | POA: Diagnosis not present

## 2018-08-27 DIAGNOSIS — M461 Sacroiliitis, not elsewhere classified: Secondary | ICD-10-CM | POA: Diagnosis not present

## 2018-08-27 DIAGNOSIS — M5137 Other intervertebral disc degeneration, lumbosacral region: Secondary | ICD-10-CM | POA: Diagnosis not present

## 2018-08-27 DIAGNOSIS — G894 Chronic pain syndrome: Secondary | ICD-10-CM | POA: Diagnosis not present

## 2018-08-28 LAB — COMPREHENSIVE METABOLIC PANEL
ALT: 32 IU/L (ref 0–44)
AST: 34 IU/L (ref 0–40)
Albumin/Globulin Ratio: 2.2 (ref 1.2–2.2)
Albumin: 5.1 g/dL — ABNORMAL HIGH (ref 3.8–4.8)
Alkaline Phosphatase: 83 IU/L (ref 39–117)
BUN/Creatinine Ratio: 10 (ref 10–24)
BUN: 11 mg/dL (ref 8–27)
Bilirubin Total: 0.9 mg/dL (ref 0.0–1.2)
CO2: 21 mmol/L (ref 20–29)
Calcium: 10 mg/dL (ref 8.6–10.2)
Chloride: 101 mmol/L (ref 96–106)
Creatinine, Ser: 1.15 mg/dL (ref 0.76–1.27)
GFR calc Af Amer: 75 mL/min/{1.73_m2} (ref >59)
GFR calc non Af Amer: 65 mL/min/{1.73_m2} (ref >59)
Globulin, Total: 2.3 g/dL (ref 1.5–4.5)
Glucose: 106 mg/dL — ABNORMAL HIGH (ref 65–99)
Potassium: 4.2 mmol/L (ref 3.5–5.2)
Sodium: 140 mmol/L (ref 134–144)
Total Protein: 7.4 g/dL (ref 6.0–8.5)

## 2018-08-28 LAB — LIPID PANEL
Chol/HDL Ratio: 4.5 ratio (ref 0.0–5.0)
Cholesterol, Total: 135 mg/dL (ref 100–199)
HDL: 30 mg/dL — ABNORMAL LOW (ref >39)
LDL CALC: 56 mg/dL (ref 0–99)
Triglycerides: 243 mg/dL — ABNORMAL HIGH (ref 0–149)
VLDL Cholesterol Cal: 49 mg/dL — ABNORMAL HIGH (ref 5–40)

## 2018-10-24 DIAGNOSIS — M47817 Spondylosis without myelopathy or radiculopathy, lumbosacral region: Secondary | ICD-10-CM | POA: Diagnosis not present

## 2018-10-24 DIAGNOSIS — M5137 Other intervertebral disc degeneration, lumbosacral region: Secondary | ICD-10-CM | POA: Diagnosis not present

## 2018-10-24 DIAGNOSIS — K5900 Constipation, unspecified: Secondary | ICD-10-CM | POA: Diagnosis not present

## 2018-10-24 DIAGNOSIS — M47816 Spondylosis without myelopathy or radiculopathy, lumbar region: Secondary | ICD-10-CM | POA: Diagnosis not present

## 2018-10-24 DIAGNOSIS — G894 Chronic pain syndrome: Secondary | ICD-10-CM | POA: Diagnosis not present

## 2018-10-24 DIAGNOSIS — M47812 Spondylosis without myelopathy or radiculopathy, cervical region: Secondary | ICD-10-CM | POA: Diagnosis not present

## 2018-10-24 DIAGNOSIS — G4700 Insomnia, unspecified: Secondary | ICD-10-CM | POA: Diagnosis not present

## 2018-10-24 DIAGNOSIS — Z79891 Long term (current) use of opiate analgesic: Secondary | ICD-10-CM | POA: Diagnosis not present

## 2018-10-24 DIAGNOSIS — M461 Sacroiliitis, not elsewhere classified: Secondary | ICD-10-CM | POA: Diagnosis not present

## 2018-12-19 DIAGNOSIS — M47817 Spondylosis without myelopathy or radiculopathy, lumbosacral region: Secondary | ICD-10-CM | POA: Diagnosis not present

## 2018-12-19 DIAGNOSIS — M5137 Other intervertebral disc degeneration, lumbosacral region: Secondary | ICD-10-CM | POA: Diagnosis not present

## 2018-12-19 DIAGNOSIS — Z79891 Long term (current) use of opiate analgesic: Secondary | ICD-10-CM | POA: Diagnosis not present

## 2018-12-19 DIAGNOSIS — M62830 Muscle spasm of back: Secondary | ICD-10-CM | POA: Diagnosis not present

## 2018-12-19 DIAGNOSIS — M1712 Unilateral primary osteoarthritis, left knee: Secondary | ICD-10-CM | POA: Diagnosis not present

## 2018-12-19 DIAGNOSIS — G894 Chronic pain syndrome: Secondary | ICD-10-CM | POA: Diagnosis not present

## 2018-12-19 DIAGNOSIS — M461 Sacroiliitis, not elsewhere classified: Secondary | ICD-10-CM | POA: Diagnosis not present

## 2019-01-01 IMAGING — DX DG SHOULDER 2+V*R*
3 series · 3 of 3 positions shown · non-contrast
Comparison: None.

CLINICAL DATA: Right shoulder pain for 4 days.

EXAM:
RIGHT SHOULDER - 2+ VIEW

[shoulder ap]
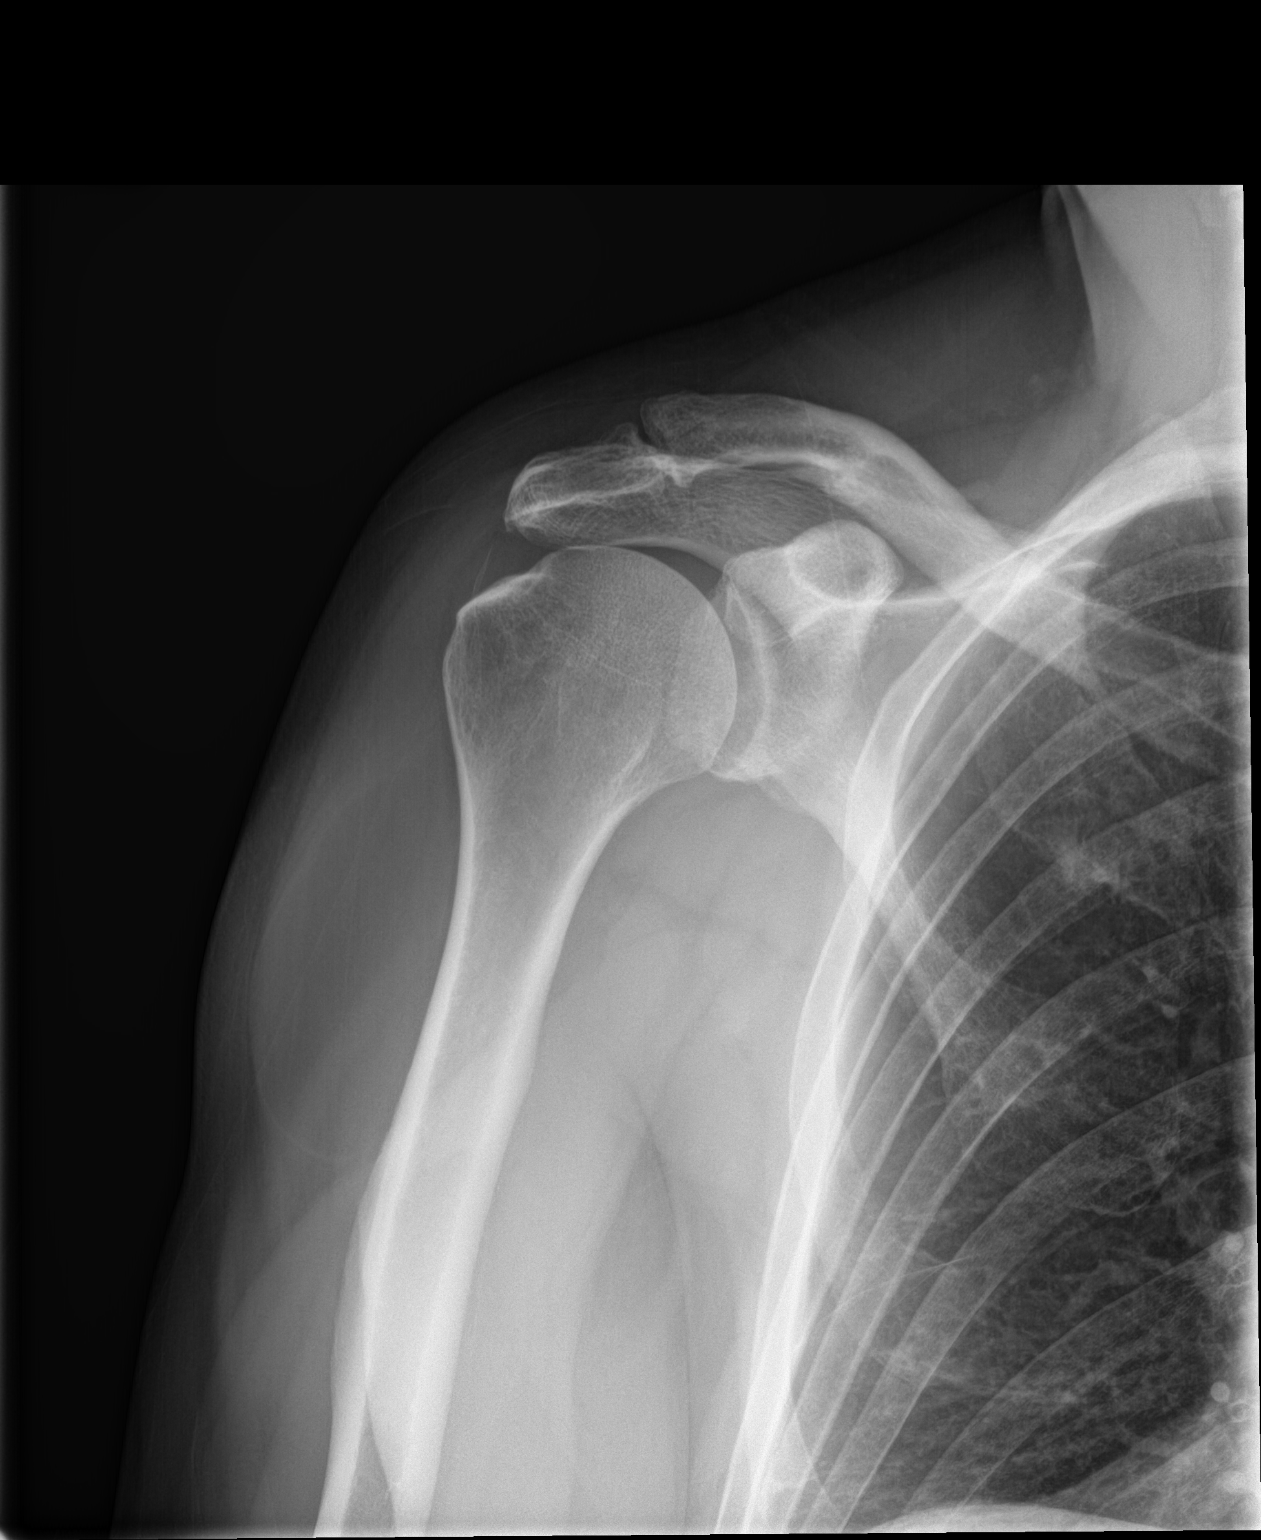

[shoulder y-view]
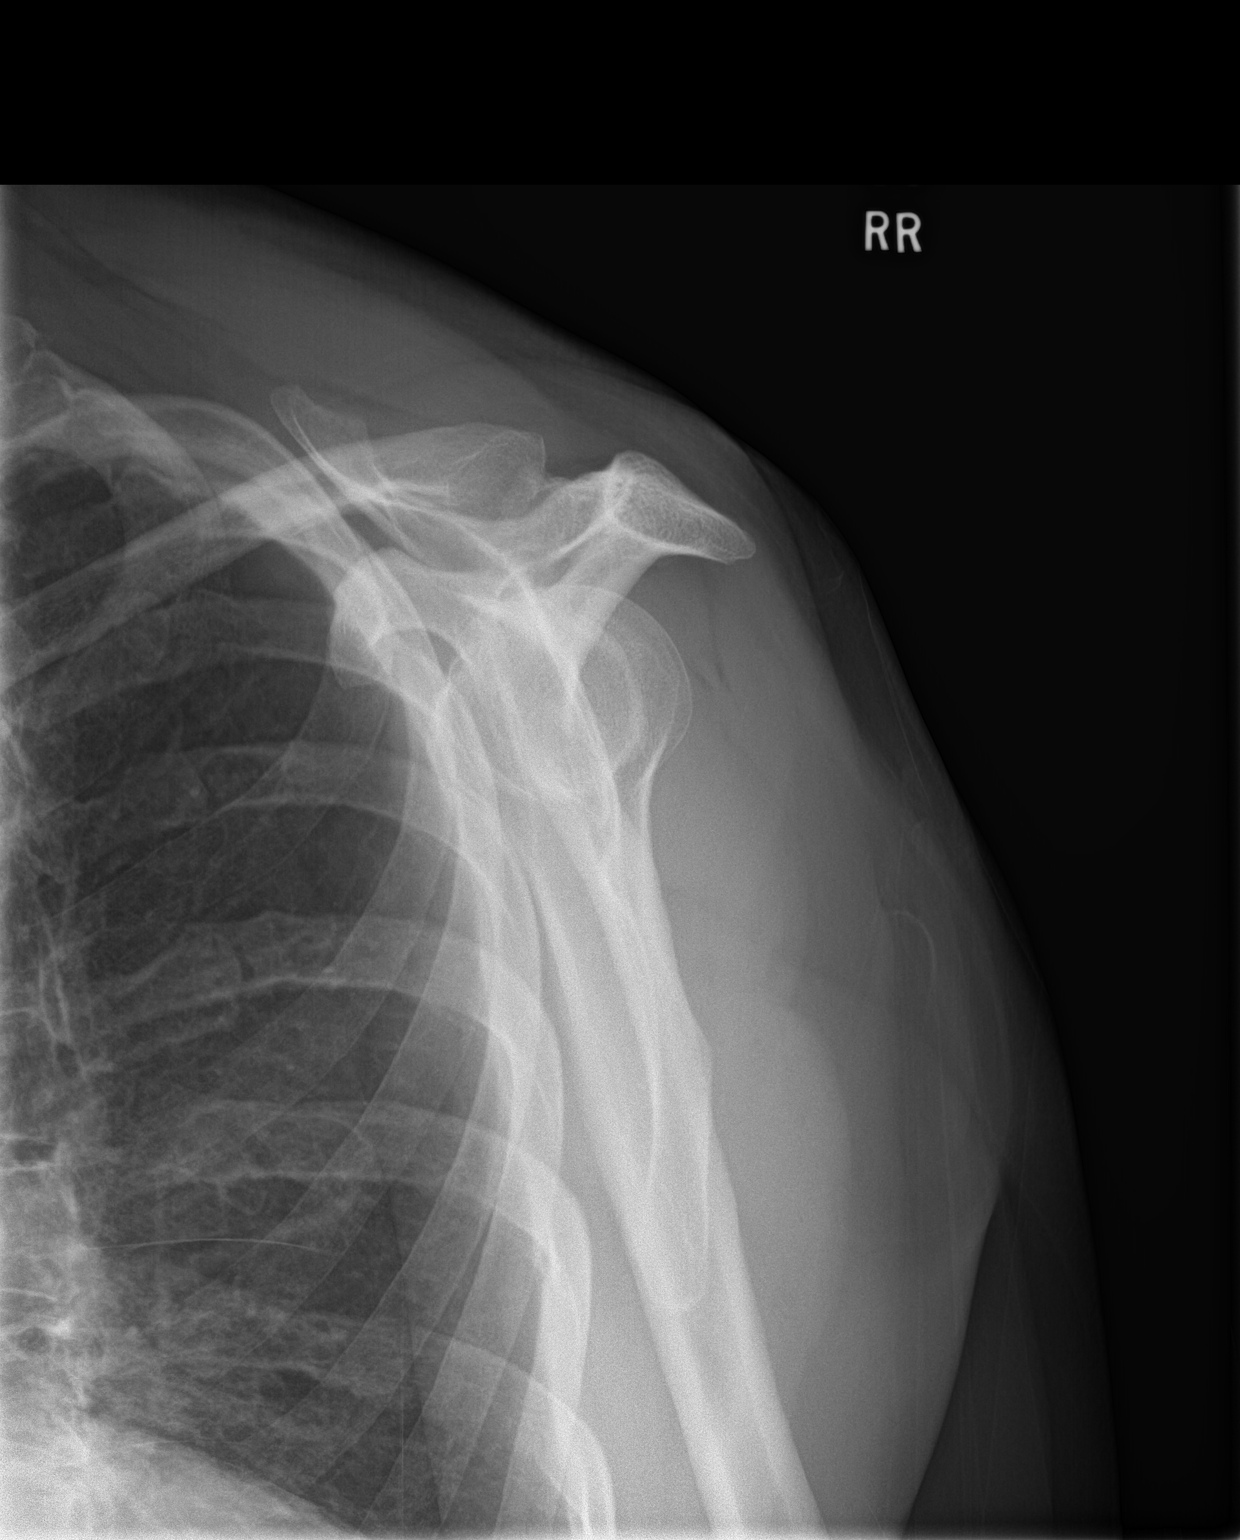

[shoulder axial]
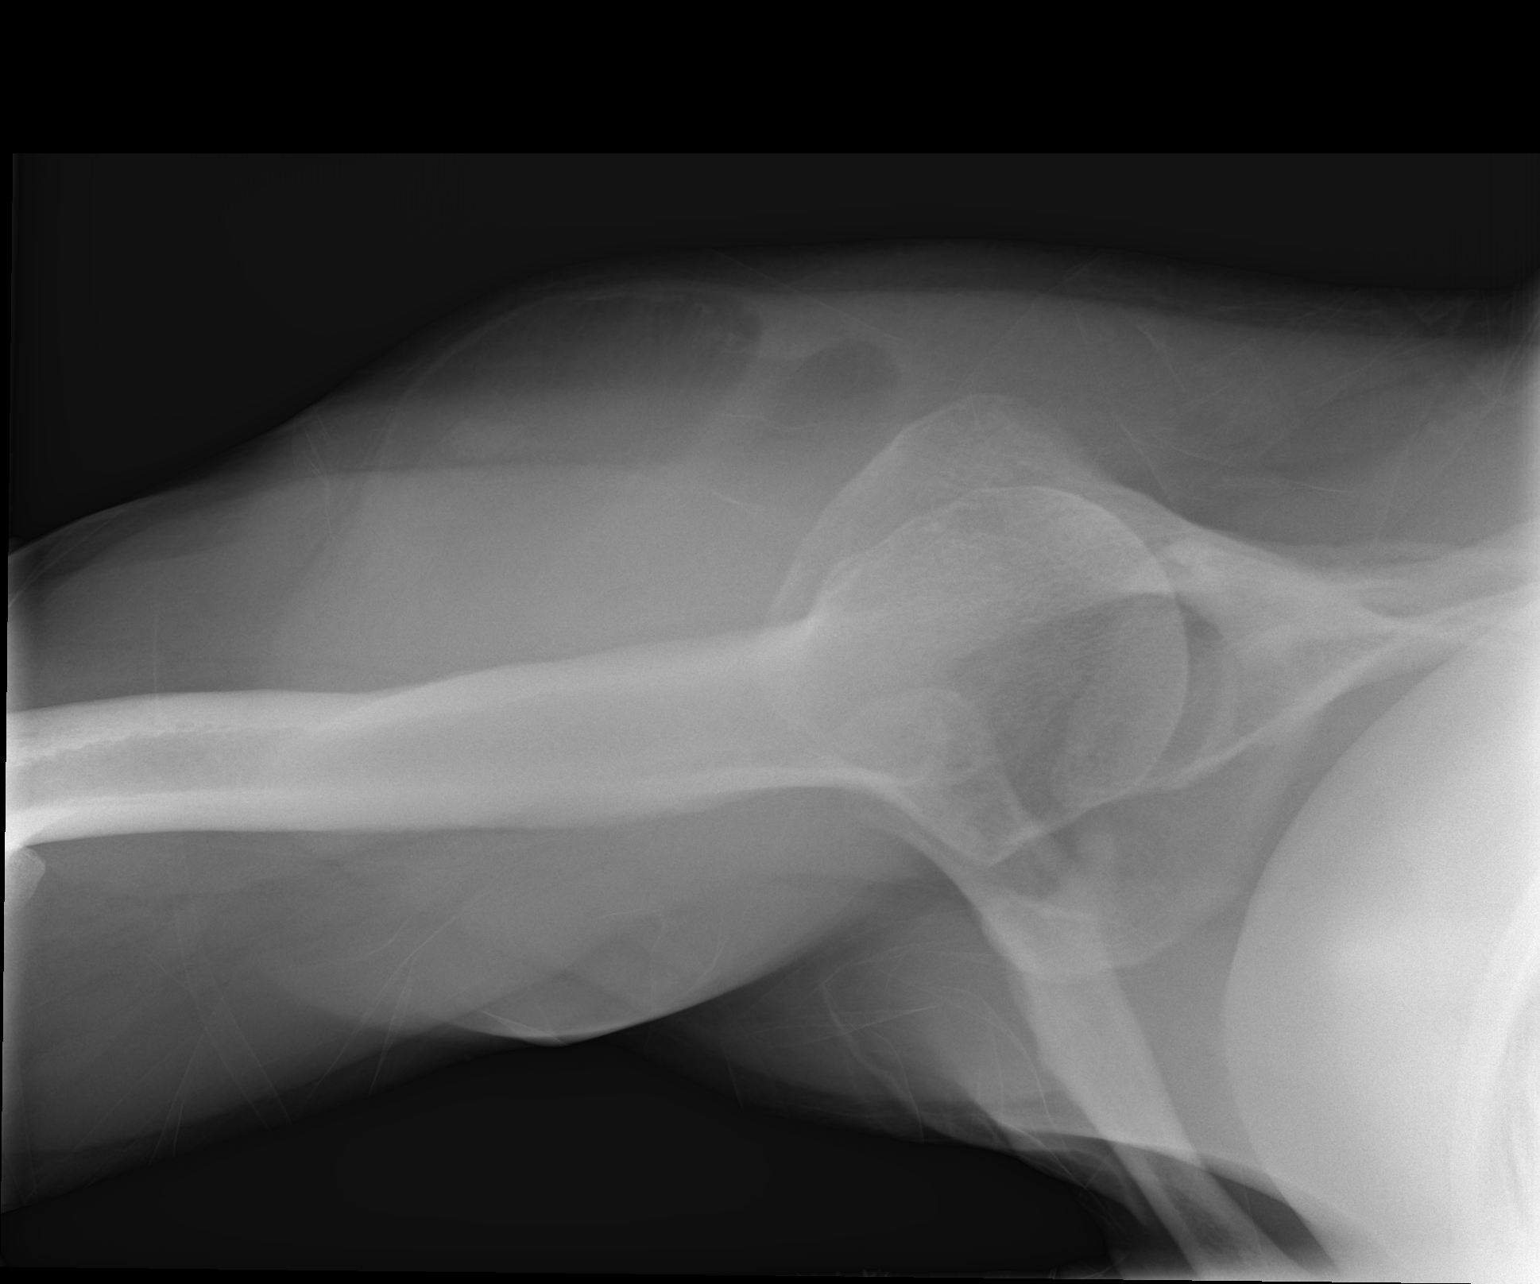

[3 of 3 positions shown; findings below may reference images not displayed]

FINDINGS: There is no evidence of fracture or dislocation. Slight AC joint
arthropathy. Soft tissues are unremarkable.
IMPRESSION: No acute abnormality.  Slight AC joint arthropathy.

## 2019-02-06 DIAGNOSIS — C61 Malignant neoplasm of prostate: Secondary | ICD-10-CM | POA: Diagnosis not present

## 2019-02-13 DIAGNOSIS — C61 Malignant neoplasm of prostate: Secondary | ICD-10-CM | POA: Diagnosis not present

## 2019-02-13 DIAGNOSIS — R351 Nocturia: Secondary | ICD-10-CM | POA: Diagnosis not present

## 2019-02-13 DIAGNOSIS — N5231 Erectile dysfunction following radical prostatectomy: Secondary | ICD-10-CM | POA: Diagnosis not present

## 2019-02-15 DIAGNOSIS — G894 Chronic pain syndrome: Secondary | ICD-10-CM | POA: Diagnosis not present

## 2019-02-15 DIAGNOSIS — M461 Sacroiliitis, not elsewhere classified: Secondary | ICD-10-CM | POA: Diagnosis not present

## 2019-02-15 DIAGNOSIS — M5137 Other intervertebral disc degeneration, lumbosacral region: Secondary | ICD-10-CM | POA: Diagnosis not present

## 2019-02-15 DIAGNOSIS — M47817 Spondylosis without myelopathy or radiculopathy, lumbosacral region: Secondary | ICD-10-CM | POA: Diagnosis not present

## 2019-02-21 ENCOUNTER — Ambulatory Visit (INDEPENDENT_AMBULATORY_CARE_PROVIDER_SITE_OTHER): Payer: Medicare Other | Admitting: Family Medicine

## 2019-02-21 ENCOUNTER — Encounter: Payer: Self-pay | Admitting: Family Medicine

## 2019-02-21 ENCOUNTER — Other Ambulatory Visit: Payer: Self-pay

## 2019-02-21 VITALS — BP 120/67 | HR 49 | Temp 97.6°F | Resp 12 | Wt 206.2 lb

## 2019-02-21 DIAGNOSIS — E785 Hyperlipidemia, unspecified: Secondary | ICD-10-CM

## 2019-02-21 DIAGNOSIS — Z1159 Encounter for screening for other viral diseases: Secondary | ICD-10-CM | POA: Diagnosis not present

## 2019-02-21 DIAGNOSIS — I10 Essential (primary) hypertension: Secondary | ICD-10-CM

## 2019-02-21 DIAGNOSIS — G894 Chronic pain syndrome: Secondary | ICD-10-CM

## 2019-02-21 DIAGNOSIS — Z8546 Personal history of malignant neoplasm of prostate: Secondary | ICD-10-CM | POA: Diagnosis not present

## 2019-02-21 DIAGNOSIS — R7303 Prediabetes: Secondary | ICD-10-CM | POA: Diagnosis not present

## 2019-02-21 MED ORDER — ATENOLOL 25 MG PO TABS: 25.0000 mg | ORAL_TABLET | Freq: Every day | ORAL | 3 refills | Status: DC

## 2019-02-21 MED ORDER — AMLODIPINE BESYLATE 10 MG PO TABS: 10.0000 mg | ORAL_TABLET | Freq: Every day | ORAL | 3 refills | Status: DC

## 2019-02-21 MED ORDER — ATORVASTATIN CALCIUM 40 MG PO TABS: 40.0000 mg | ORAL_TABLET | Freq: Every day | ORAL | 3 refills | Status: DC

## 2019-02-21 NOTE — Progress Notes (Signed)
Subjective:    Patient ID: Matthew [name], male    DOB: [birth year], 68 y.o.   MRN: [id number]  HPI Matthew [name] is a 68 y.o. male Presents today for: Chief Complaint  Patient presents with  . chronic medical condition    patient is here for his 6 month f/u   Hypertension: BP Readings from Last 3 Encounters:  02/21/19 120/67  08/21/18 134/68  06/26/18 (!) 152/64   Lab Results  Component Value Date   CREATININE 1.15 08/21/2018  Amlodipine 10 mg daily, atenolol 25 mg daily.  No new side effects.   Chronic Pain syndrome: Followed by Dr. Greta Doom - treated with flexeril and oxycodone.   Hyperlipidemia:  Lab Results  Component Value Date   CHOL 135 08/21/2018   HDL 30 (L) 08/21/2018   LDLCALC 56 08/21/2018   TRIG 243 (H) 08/21/2018   CHOLHDL 4.5 08/21/2018   Lab Results  Component Value Date   ALT 32 08/21/2018   AST 34 08/21/2018   ALKPHOS 83 08/21/2018   BILITOT 0.9 08/21/2018  Stable on January labs.  Continue Lipitor 40 mg daily. no new myalgias.   History of prostate cancer Urologist Dr. Diona Fanti, appointment in January.  Reported PSA trend was excellent on minimal medical therapy.  Status post surgery/salvage radiotherapy. Treated with elavil, proscar.   Prediabetes: Lab Results  Component Value Date   HGBA1C 5.7 (H) 08/21/2018  Recommended continued adherence to diet/activity.  Weight has decreased since January. Has adjusted diet, working outside - staying hydrated. Not eating much breakfast.  Wt Readings from Last 3 Encounters:  02/21/19 206 lb 3.2 oz (93.5 kg)  08/21/18 221 lb 3.2 oz (100.3 kg)  06/26/18 216 lb (98 kg)      Patient Active Problem List   Diagnosis Date Noted  . Impingement syndrome of right shoulder region 01/18/2018  . Hypertension 07/10/2017  . Pain of both shoulder joints 05/03/2017  . Sleep disturbance 11/15/2012  . Snoring 11/15/2012  . Chronic back pain 11/15/2012  . Cancer ([place]) 02/11/2005   Past Medical History:  Diagnosis  Date  . Arthritis   . Cancer ([facility]) 02/11/2005   prostate cancer, gleason 3+3=6, vol 30 cc  . Chronic back pain 11/15/2012  . GERD (gastroesophageal reflux disease)   . Gout   . History of shingles   . Hx of radiation therapy 08/04/05 to 09/22/2005   prostate bed  . Hypercholesteremia   . Hypertension   . Sleep disturbance 11/15/2012  . Snoring 11/15/2012   Past Surgical History:  Procedure Laterality Date  . COLONOSCOPY    . EYE SURGERY    . HERNIA REPAIR  05/23/12   RIH  . NECK SURGERY    . PROSTATECTOMY  03/2005   Gleason 3+4=7  . UPPER GASTROINTESTINAL ENDOSCOPY     No Known Allergies Prior to Admission medications   Medication Sig Start Date End Date Taking? Authorizing Provider  amitriptyline (ELAVIL) 25 MG tablet Take 25 mg by mouth at bedtime.   Yes [provider]  amLODipine (NORVASC) 10 MG tablet Take 1 tablet (10 mg total) by mouth daily. 08/21/18  Yes Wendie Agreste, MD  atenolol (TENORMIN) 25 MG tablet Take 1 tablet (25 mg total) by mouth daily. 08/21/18  Yes Wendie Agreste, MD  atorvastatin (LIPITOR) 40 MG tablet Take 1 tablet (40 mg total) by mouth daily. 08/21/18  Yes Wendie Agreste, MD  bicalutamide (CASODEX) 50 MG tablet Take 50 mg by mouth daily.   Yes  [provider]  clotrimazole-betamethasone (LOTRISONE) cream Apply 1 application topically 2 (two) times daily. 05/17/18  Yes Wendie Agreste, MD  cyclobenzaprine (FLEXERIL) 10 MG tablet Take 10 mg by mouth 3 (three) times daily as needed. 05/22/17  Yes [provider]  finasteride (PROSCAR) 5 MG tablet Take 5 mg by mouth daily.   Yes [provider]  mupirocin ointment (BACTROBAN) 2 % Apply 1 application topically 2 (two) times daily. 12/13/17  Yes Tereasa Coop, PA-C  oxyCODONE-acetaminophen (PERCOCET) 10-325 MG tablet TK 1 T PO Q 6 H PRF PAIN 10/12/17  Yes [provider]  pantoprazole (PROTONIX) 40 MG tablet Take 1 tablet (40 mg total) by mouth daily as needed.  06/28/18  Yes Danis, Kirke Corin, MD   Social History   Socioeconomic History  . Marital status: Married    Spouse name: Not on file  . Number of children: 1  . Years of education: Not on file  . Highest education level: Not on file  Occupational History  . Not on file  Social Needs  . Financial resource strain: Not on file  . Food insecurity    Worry: Not on file    Inability: Not on file  . Transportation needs    Medical: Not on file    Non-medical: Not on file  Tobacco Use  . Smoking status: Never Smoker  . Smokeless tobacco: Current User    Types: Chew, Snuff  . Tobacco comment: 50 + yrs started chewing tobacco at 68 yrs old  Substance and Sexual Activity  . Alcohol use: No  . Drug use: No  . Sexual activity: Not on file  Lifestyle  . Physical activity    Days per week: Not on file    Minutes per session: Not on file  . Stress: Not on file  Relationships  . Social Herbalist on phone: Not on file    Gets together: Not on file    Attends religious service: Not on file    Active member of club or organization: Not on file    Attends meetings of clubs or organizations: Not on file    Relationship status: Not on file  . Intimate partner violence    Fear of current or ex partner: Not on file    Emotionally abused: Not on file    Physically abused: Not on file    Forced sexual activity: Not on file  Other Topics Concern  . Not on file  Social History Narrative   Married, 1 son, pipe fitter    Review of Systems  Constitutional: Negative for fatigue and unexpected weight change.  Eyes: Negative for visual disturbance.  Respiratory: Negative for cough, chest tightness and shortness of breath.   Cardiovascular: Negative for chest pain, palpitations and leg swelling.  Gastrointestinal: Negative for abdominal pain and blood in stool.  Neurological: Negative for dizziness, light-headedness and headaches.       Objective:   Physical Exam Vitals signs  reviewed.  Constitutional:      Appearance: He is well-developed.  HENT:     Head: Normocephalic and atraumatic.  Eyes:     Pupils: Pupils are equal, round, and reactive to light.  Neck:     Vascular: No carotid bruit or JVD.  Cardiovascular:     Rate and Rhythm: Normal rate and regular rhythm.     Heart sounds: Normal heart sounds. No murmur.  Pulmonary:     Effort: Pulmonary effort  is normal.     Breath sounds: Normal breath sounds. No rales.  Skin:    General: Skin is warm and dry.  Neurological:     Mental Status: He is alert and oriented to person, place, and time.    Vitals:   02/21/19 1051  BP: 120/67  Pulse: (!) 49  Resp: 12  Temp: 97.6 F (36.4 C)  TempSrc: Oral  SpO2: 99%  Weight: 206 lb 3.2 oz (93.5 kg)      Assessment & Plan:   Matthew [name] is a 68 y.o. male Prediabetes - Plan: Hemoglobin A1c  -Commended on weight loss, but stressed importance of breakfast each day and to maintain hydration when it is hot outside or working outside.  Recheck A1c.  Essential hypertension - Plan: Comprehensive metabolic panel, amLODipine (NORVASC) 10 MG tablet, atenolol (TENORMIN) 25 MG tablet  -Stable, tolerating current meds, check labs.  Refilled same  Hyperlipidemia, unspecified hyperlipidemia type,Lipid Panel Dyslipidemia - Plan: atorvastatin (LIPITOR) 40 MG tablet  -Tolerating Lipitor, continue same dose  Continue follow-up with urologist for history of prostate cancer, and pain management specialist.  Need for hepatitis C screening test - Plan: Hepatitis C antibody   Meds ordered this encounter  Medications  . amLODipine (NORVASC) 10 MG tablet    Sig: Take 1 tablet (10 mg total) by mouth daily.    Dispense:  90 tablet    Refill:  3  . atenolol (TENORMIN) 25 MG tablet    Sig: Take 1 tablet (25 mg total) by mouth daily.    Dispense:  90 tablet    Refill:  3  . atorvastatin (LIPITOR) 40 MG tablet    Sig: Take 1 tablet (40 mg total) by mouth daily.     Dispense:  90 tablet    Refill:  3   Patient Instructions    I will check some lab work but no change in medications today.  Continue follow-up with the urologist and pain management specialist.  I do recommend some form of breakfast each day, even if it is something small but healthy such as whole-grain cereal, granola bar, yogurt, etc.  Follow-up in 6 months for wellness exam.  Let me know if you have questions in the meantime and stay safe.    If you have lab work done today you will be contacted with your lab results within the next 2 weeks.  If you have not heard from [place] then please contact us. The fastest way to get your results is to register for My Chart.   IF you received an x-ray today, you will receive an invoice from [facility]. Please contact [facility] at [phone] with questions or concerns regarding your invoice.   IF you received labwork today, you will receive an invoice from [place]. Please contact [facility] at [phone] with questions or concerns regarding your invoice.   Our billing staff will not be able to assist you with questions regarding bills from these companies.  You will be contacted with the lab results as soon as they are available. The fastest way to get your results is to activate your My Chart account. Instructions are located on the last page of this paperwork. If you have not heard from [place] regarding the results in 2 weeks, please contact this office.       Signed,   Merri Ray, MD [facility].  02/21/19 11:15 AM

## 2019-02-21 NOTE — Patient Instructions (Addendum)
  I will check some lab work but no change in medications today.  Continue follow-up with the urologist and pain management specialist.  I do recommend some form of breakfast each day, even if it is something small but healthy such as whole-grain cereal, granola bar, yogurt, etc.  Follow-up in 6 months for wellness exam.  Let me know if you have questions in the meantime and stay safe.    If you have lab work done today you will be contacted with your lab results within the next 2 weeks.  If you have not heard from [place] then please contact us. The fastest way to get your results is to register for My Chart.   IF you received an x-ray today, you will receive an invoice from [facility]. Please contact [facility] at [phone] with questions or concerns regarding your invoice.   IF you received labwork today, you will receive an invoice from [place]. Please contact [facility] at [phone] with questions or concerns regarding your invoice.   Our billing staff will not be able to assist you with questions regarding bills from these companies.  You will be contacted with the lab results as soon as they are available. The fastest way to get your results is to activate your My Chart account. Instructions are located on the last page of this paperwork. If you have not heard from [place] regarding the results in 2 weeks, please contact this office.

## 2019-02-22 LAB — HEMOGLOBIN A1C
Est. average glucose Bld gHb Est-mCnc: 128 mg/dL
Hgb A1c MFr Bld: 6.1 % — ABNORMAL HIGH (ref 4.8–5.6)

## 2019-02-22 LAB — LIPID PANEL
Chol/HDL Ratio: 4.8 ratio (ref 0.0–5.0)
Cholesterol, Total: 159 mg/dL (ref 100–199)
HDL: 33 mg/dL — ABNORMAL LOW (ref >39)
LDL Calculated: 84 mg/dL (ref 0–99)
Triglycerides: 212 mg/dL — ABNORMAL HIGH (ref 0–149)
VLDL Cholesterol Cal: 42 mg/dL — ABNORMAL HIGH (ref 5–40)

## 2019-02-22 LAB — COMPREHENSIVE METABOLIC PANEL
ALT: 25 IU/L (ref 0–44)
AST: 33 IU/L (ref 0–40)
Albumin/Globulin Ratio: 1.9 (ref 1.2–2.2)
Albumin: 5 g/dL — ABNORMAL HIGH (ref 3.8–4.8)
Alkaline Phosphatase: 80 IU/L (ref 39–117)
BUN/Creatinine Ratio: 12 (ref 10–24)
BUN: 14 mg/dL (ref 8–27)
Bilirubin Total: 0.7 mg/dL (ref 0.0–1.2)
CO2: 17 mmol/L — ABNORMAL LOW (ref 20–29)
Calcium: 9.7 mg/dL (ref 8.6–10.2)
Chloride: 104 mmol/L (ref 96–106)
Creatinine, Ser: 1.19 mg/dL (ref 0.76–1.27)
GFR calc Af Amer: 72 mL/min/{1.73_m2} (ref >59)
GFR calc non Af Amer: 62 mL/min/{1.73_m2} (ref >59)
Globulin, Total: 2.7 g/dL (ref 1.5–4.5)
Glucose: 117 mg/dL — ABNORMAL HIGH (ref 65–99)
Potassium: 4.9 mmol/L (ref 3.5–5.2)
Sodium: 142 mmol/L (ref 134–144)
Total Protein: 7.7 g/dL (ref 6.0–8.5)

## 2019-02-22 LAB — HEPATITIS C ANTIBODY: Hep C Virus Ab: <0.1 s/co ratio (ref 0.0–0.9)

## 2019-03-22 DIAGNOSIS — Z23 Encounter for immunization: Secondary | ICD-10-CM | POA: Diagnosis not present

## 2019-04-16 DIAGNOSIS — M461 Sacroiliitis, not elsewhere classified: Secondary | ICD-10-CM | POA: Diagnosis not present

## 2019-04-16 DIAGNOSIS — M47817 Spondylosis without myelopathy or radiculopathy, lumbosacral region: Secondary | ICD-10-CM | POA: Diagnosis not present

## 2019-04-16 DIAGNOSIS — G894 Chronic pain syndrome: Secondary | ICD-10-CM | POA: Diagnosis not present

## 2019-04-16 DIAGNOSIS — M5137 Other intervertebral disc degeneration, lumbosacral region: Secondary | ICD-10-CM | POA: Diagnosis not present

## 2019-05-13 ENCOUNTER — Ambulatory Visit (INDEPENDENT_AMBULATORY_CARE_PROVIDER_SITE_OTHER): Payer: Medicare Other

## 2019-05-13 ENCOUNTER — Encounter: Payer: Self-pay | Admitting: Family Medicine

## 2019-05-13 ENCOUNTER — Other Ambulatory Visit: Payer: Self-pay

## 2019-05-13 ENCOUNTER — Ambulatory Visit (INDEPENDENT_AMBULATORY_CARE_PROVIDER_SITE_OTHER): Payer: Medicare Other | Admitting: Family Medicine

## 2019-05-13 VITALS — BP 140/80 | HR 59 | Temp 98.4°F | Wt 208.2 lb

## 2019-05-13 DIAGNOSIS — R0601 Orthopnea: Secondary | ICD-10-CM | POA: Diagnosis not present

## 2019-05-13 DIAGNOSIS — R519 Headache, unspecified: Secondary | ICD-10-CM

## 2019-05-13 DIAGNOSIS — R0609 Other forms of dyspnea: Secondary | ICD-10-CM

## 2019-05-13 DIAGNOSIS — R0600 Dyspnea, unspecified: Secondary | ICD-10-CM

## 2019-05-13 DIAGNOSIS — R0789 Other chest pain: Secondary | ICD-10-CM

## 2019-05-13 LAB — POCT CBC
Granulocyte percent: 61.6 %G (ref 37–80)
HCT, POC: 40.4 % (ref 29–41)
Hemoglobin: 14.2 g/dL (ref 11–14.6)
Lymph, poc: 2.6 (ref 0.6–3.4)
MCH, POC: 30.9 pg (ref 27–31.2)
MCHC: 35.2 g/dL (ref 31.8–35.4)
MCV: 87.8 fL (ref 76–111)
MID (cbc): 0.4 (ref 0–0.9)
MPV: 6.8 fL (ref 0–99.8)
POC Granulocyte: 4.9 (ref 2–6.9)
POC LYMPH PERCENT: 32.8 %L (ref 10–50)
POC MID %: 5.6 %M (ref 0–12)
Platelet Count, POC: 192 10*3/uL (ref 142–424)
RBC: 4.61 M/uL — AB (ref 4.69–6.13)
RDW, POC: 13.4 %
WBC: 8 10*3/uL (ref 4.6–10.2)

## 2019-05-13 NOTE — Patient Instructions (Addendum)
I am checking some tests for heart, but EKG and xray looked ok.  Avoid any exertional activities for now.  Depending on labs tomorrow, can decide on next step.  If any worsening symptoms - go to the nearest emergency room or call 911.   Although less likely, I did check a COVID-19 test and with your current symptoms would recommend staying at home, avoiding others until we have those results.   Shortness of Breath, Adult Shortness of breath means you have trouble breathing. Shortness of breath could be a sign of a medical problem. Follow these instructions at home:   Watch for any changes in your symptoms.  Do not use any products that contain nicotine or tobacco, such as cigarettes, e-cigarettes, and chewing tobacco.  Do not smoke. Smoking can cause shortness of breath. If you need help to quit smoking, ask your doctor.  Avoid things that can make it harder to breathe, such as: ? Mold. ? Dust. ? Air pollution. ? Chemical smells. ? Things that can cause allergy symptoms (allergens), if you have allergies.  Keep your living space clean. Use products that help remove mold and dust.  Rest as needed. Slowly return to your normal activities.  Take over-the-counter and prescription medicines only as told by your doctor. This includes oxygen therapy and inhaled medicines.  Keep all follow-up visits as told by your doctor. This is important. Contact a doctor if:  Your condition does not get better as soon as expected.  You have a hard time doing your normal activities, even after you rest.  You have new symptoms. Get help right away if:  Your shortness of breath gets worse.  You have trouble breathing when you are resting.  You feel light-headed or you pass out (faint).  You have a cough that is not helped by medicines.  You cough up blood.  You have pain with breathing.  You have pain in your chest, arms, shoulders, or belly (abdomen).  You have a fever.  You cannot  walk up stairs.  You cannot exercise the way you normally do. These symptoms may represent a serious problem that is an emergency. Do not wait to see if the symptoms will go away. Get medical help right away. Call your local emergency services (911 in the [place] Do not drive yourself to the hospital. Summary  Shortness of breath is when you have trouble breathing enough air. It can be a sign of a medical problem.  Avoid things that make it hard for you to breathe, such as smoking, pollution, mold, and dust.  Watch for any changes in your symptoms. Contact your doctor if you do not get better or you get worse. This information is not intended to replace advice given to you by your health care provider. Make sure you discuss any questions you have with your health care provider. Document Released: 12/28/2007 Document Revised: 12/11/2017 Document Reviewed: 12/11/2017 Elsevier Patient Education  [employer].   If you have lab work done today you will be contacted with your lab results within the next 2 weeks.  If you have not heard from [place] then please contact us. The fastest way to get your results is to register for My Chart.   IF you received an x-ray today, you will receive an invoice from [facility]. Please contact [facility] at [phone] with questions or concerns regarding your invoice.   IF you received labwork today, you will receive an invoice from [facility]. Please contact  [facility] at [phone] with questions or concerns regarding your invoice.   Our billing staff will not be able to assist you with questions regarding bills from these companies.  You will be contacted with the lab results as soon as they are available. The fastest way to get your results is to activate your My Chart account. Instructions are located on the last page of this paperwork. If you have not heard from [place] regarding the results in 2 weeks, please contact this office.

## 2019-05-13 NOTE — Progress Notes (Signed)
Subjective:    Patient ID: Matthew [name], male    DOB: 1951-07-07, 68 y.o.   MRN: [id number]  HPI Matthew [name] is a 68 y.o. male Presents today for: Chief Complaint  Patient presents with  . Back Pain    upper back pain, tightness chest, and sob. Cant get enough air. This all happens at night. Have some temple pain   Chest tightness: As above - with upper back pain, dyspnea. Trouble breathing past week. No cough. No fever. Temp 97 this am. No known sick contacts, no known exposure to Covid.   Trouble lying down at night - can't sleep, can't breathe.  Feels like asthma when he was a child, but no asthma attack in 50 years.   Hurts to breath deep. Feels like something sitting on chest and upper lungs. Above symptoms primarily at night. Staying busy during the day, but some shortness of breath with exertion.  Sweating more with activity this past week. Working outside and housework.  Denies change in taste/smell.  Tx: coricidin - made worse - heart beating irregular, last night some time. Only noticed palpitations last night.   Some headache, pain behind bilateral temples. Started 1 week ago, more at night than day, but still some during the day. No change in vision.   Cardiac disease risk factors of hypertension, hyperlipidemia, prediabetes, age.  Smokeless tobacco user, non-smoker.  Normal EKG in October 2018. No known hx of CAD/CHF.   No recent prolonged car travel or air travel, no recent calf pain or swelling, no surgery. No history of DVT.    Patient Active Problem List   Diagnosis Date Noted  . Impingement syndrome of right shoulder region 01/18/2018  . Hypertension 07/10/2017  . Pain of both shoulder joints 05/03/2017  . Sleep disturbance 11/15/2012  . Snoring 11/15/2012  . Chronic back pain 11/15/2012  . Cancer ([place]) 02/11/2005   Past Medical History:  Diagnosis Date  . Arthritis   . Cancer ([place]) 02/11/2005   prostate cancer, gleason 3+3=6, vol 30 cc  . Chronic back pain  11/15/2012  . GERD (gastroesophageal reflux disease)   . Gout   . History of shingles   . Hx of radiation therapy 08/04/05 to 09/22/2005   prostate bed  . Hypercholesteremia   . Hypertension   . Sleep disturbance 11/15/2012  . Snoring 11/15/2012   Past Surgical History:  Procedure Laterality Date  . COLONOSCOPY    . EYE SURGERY    . HERNIA REPAIR  05/23/12   RIH  . NECK SURGERY    . PROSTATECTOMY  03/2005   Gleason 3+4=7  . UPPER GASTROINTESTINAL ENDOSCOPY     No Known Allergies Prior to Admission medications   Medication Sig Start Date End Date Taking? Authorizing Provider  amitriptyline (ELAVIL) 25 MG tablet Take 25 mg by mouth at bedtime.    [provider]  amLODipine (NORVASC) 10 MG tablet Take 1 tablet (10 mg total) by mouth daily. 02/21/19   Wendie Agreste, MD  atenolol (TENORMIN) 25 MG tablet Take 1 tablet (25 mg total) by mouth daily. 02/21/19   Wendie Agreste, MD  atorvastatin (LIPITOR) 40 MG tablet Take 1 tablet (40 mg total) by mouth daily. 02/21/19   Wendie Agreste, MD  bicalutamide (CASODEX) 50 MG tablet Take 50 mg by mouth daily.    [provider]  finasteride (PROSCAR) 5 MG tablet Take 5 mg by mouth daily.    [provider]  mupirocin ointment Drue Stager)  2 % Apply 1 application topically 2 (two) times daily. 12/13/17   Tereasa Coop, PA-C  oxyCODONE-acetaminophen (PERCOCET) 10-325 MG tablet TK 1 T PO Q 6 H PRF PAIN 10/12/17   [provider]   Social History   Socioeconomic History  . Marital status: Married    Spouse name: Not on file  . Number of children: 1  . Years of education: Not on file  . Highest education level: Not on file  Occupational History  . Not on file  Social Needs  . Financial resource strain: Not on file  . Food insecurity    Worry: Not on file    Inability: Not on file  . Transportation needs    Medical: Not on file    Non-medical: Not on file  Tobacco Use  . Smoking status: Never Smoker   . Smokeless tobacco: Current User    Types: Chew, Snuff  . Tobacco comment: 50 + yrs started chewing tobacco at 68 yrs old  Substance and Sexual Activity  . Alcohol use: No  . Drug use: No  . Sexual activity: Not on file  Lifestyle  . Physical activity    Days per week: Not on file    Minutes per session: Not on file  . Stress: Not on file  Relationships  . Social Herbalist on phone: Not on file    Gets together: Not on file    Attends religious service: Not on file    Active member of club or organization: Not on file    Attends meetings of clubs or organizations: Not on file    Relationship status: Not on file  . Intimate partner violence    Fear of current or ex partner: Not on file    Emotionally abused: Not on file    Physically abused: Not on file    Forced sexual activity: Not on file  Other Topics Concern  . Not on file  Social History Narrative   Married, 1 son, pipe fitter     Review of Systems Per HPI.     Objective:   Physical Exam Vitals signs reviewed.  Constitutional:      Appearance: He is well-developed.  HENT:     Head: Normocephalic and atraumatic.     Comments: No temple ttp.  Eyes:     Pupils: Pupils are equal, round, and reactive to light.  Neck:     Vascular: No carotid bruit or JVD.  Cardiovascular:     Rate and Rhythm: Normal rate and regular rhythm.     Heart sounds: Normal heart sounds. No murmur.  Pulmonary:     Effort: Pulmonary effort is normal. No respiratory distress.     Breath sounds: Normal breath sounds. No wheezing or rales.  Abdominal:     General: There is no distension.     Tenderness: There is no abdominal tenderness. There is no guarding.  Musculoskeletal:        General: No swelling (min pedal edema, nonpitting at ankles. ) or tenderness.     Comments: Calves NT, negative Homans.  Skin:    General: Skin is warm and dry.  Neurological:     Mental Status: He is alert and oriented to person, place, and  time.    Vitals:   05/13/19 1607  BP: 140/80  Pulse: (!) 59  Temp: 98.4 F (36.9 C)  TempSrc: Oral  SpO2: 98%  Weight: 208 lb 3.2 oz (94.4 kg)  EKG: SR, no apparent acute findings or changes from prior.  Results for orders placed or performed in visit on 05/13/19  Sedimentation Rate  Result Value Ref Range   Sed Rate 2 0 - 30 mm/hr  Pro b natriuretic peptide  Result Value Ref Range   NT-Pro BNP 207 0 - 376 pg/mL  POCT CBC  Result Value Ref Range   WBC 8.0 4.6 - 10.2 K/uL   Lymph, poc 2.6 0.6 - 3.4   POC LYMPH PERCENT 32.8 10 - 50 %L   MID (cbc) 0.4 0 - 0.9   POC MID % 5.6 0 - 12 %M   POC Granulocyte 4.9 2 - 6.9   Granulocyte percent 61.6 37 - 80 %G   RBC 4.61 (A) 4.69 - 6.13 M/uL   Hemoglobin 14.2 11 - 14.6 g/dL   HCT, POC 40.4 29 - 41 %   MCV 87.8 76 - 111 fL   MCH, POC 30.9 27 - 31.2 pg   MCHC 35.2 31.8 - 35.4 g/dL   RDW, POC 13.4 %   Platelet Count, POC 192 142 - 424 K/uL   MPV 6.8 0 - 99.8 fL       Dg Chest 2 View  Result Date: 05/13/2019 CLINICAL DATA:  One-week history of dyspnea on exertion. EXAM: CHEST - 2 VIEW COMPARISON:  12/07/2016 FINDINGS: The heart size and mediastinal contours are within normal limits. Both lungs are clear. The visualized skeletal structures are unremarkable. IMPRESSION: No active cardiopulmonary disease. Electronically Signed   By: Kerby Moors M.D.   On: 05/13/2019 17:17    Assessment & Plan:   Matthew [name] is a 68 y.o. male DOE (dyspnea on exertion) - Plan: EKG 12-Lead, DG Chest 2 View, POCT CBC, Pro b natriuretic peptide, Novel Coronavirus, NAA ([facility]), Ambulatory referral to Cardiology Chest tightness - Plan: EKG 12-Lead, DG Chest 2 View, POCT CBC, Ambulatory referral to Cardiology Orthopnea - Plan: EKG 12-Lead, DG Chest 2 View, POCT CBC  -Symptoms initially suspicious for cardiac source but no sign of CHF on x-ray, no significant signs of fluid overload.  BNP reassuring, EKG without acute findings.  -No history of DVT,  no calf pain/swelling, no known risk factors for DVT/PE.  -Although no cough, fever, change in taste or smell, did check COVID-19 testing.  Advised patient to remain at home and decreased activity for now.  -Cardiology referral ordered.  Potentially may need negative Covid testing first.  -ER/RTC precautions discussed.  Temporal headache - Plan: Sedimentation Rate  -Sed rate normal, unlikely giant cell arteritis.  Symptomatic care with Tylenol with ER/RTC precautions.  Covid testing as above.  No orders of the defined types were placed in this encounter.  Patient Instructions   I am checking some tests for heart, but EKG and xray looked ok.  Avoid any exertional activities for now.  Depending on labs tomorrow, can decide on next step.  If any worsening symptoms - go to the nearest emergency room or call 911.   Although less likely, I did check a COVID-19 test and with your current symptoms would recommend staying at home, avoiding others until we have those results.   Shortness of Breath, Adult Shortness of breath means you have trouble breathing. Shortness of breath could be a sign of a medical problem. Follow these instructions at home:   Watch for any changes in your symptoms.  Do not use any products that contain nicotine or tobacco, such as cigarettes, e-cigarettes, and chewing tobacco.  Do not smoke. Smoking can cause shortness of breath. If you need help to quit smoking, ask your doctor.  Avoid things that can make it harder to breathe, such as: ? Mold. ? Dust. ? Air pollution. ? Chemical smells. ? Things that can cause allergy symptoms (allergens), if you have allergies.  Keep your living space clean. Use products that help remove mold and dust.  Rest as needed. Slowly return to your normal activities.  Take over-the-counter and prescription medicines only as told by your doctor. This includes oxygen therapy and inhaled medicines.  Keep all follow-up visits as told  by your doctor. This is important. Contact a doctor if:  Your condition does not get better as soon as expected.  You have a hard time doing your normal activities, even after you rest.  You have new symptoms. Get help right away if:  Your shortness of breath gets worse.  You have trouble breathing when you are resting.  You feel light-headed or you pass out (faint).  You have a cough that is not helped by medicines.  You cough up blood.  You have pain with breathing.  You have pain in your chest, arms, shoulders, or belly (abdomen).  You have a fever.  You cannot walk up stairs.  You cannot exercise the way you normally do. These symptoms may represent a serious problem that is an emergency. Do not wait to see if the symptoms will go away. Get medical help right away. Call your local emergency services (911 in the [place] Do not drive yourself to the hospital. Summary  Shortness of breath is when you have trouble breathing enough air. It can be a sign of a medical problem.  Avoid things that make it hard for you to breathe, such as smoking, pollution, mold, and dust.  Watch for any changes in your symptoms. Contact your doctor if you do not get better or you get worse. This information is not intended to replace advice given to you by your health care provider. Make sure you discuss any questions you have with your health care provider. Document Released: 12/28/2007 Document Revised: 12/11/2017 Document Reviewed: 12/11/2017 Elsevier Patient Education  [employer].   If you have lab work done today you will be contacted with your lab results within the next 2 weeks.  If you have not heard from [place] then please contact us. The fastest way to get your results is to register for My Chart.   IF you received an x-ray today, you will receive an invoice from [facility]. Please contact [facility] at [phone] with questions or concerns regarding your  invoice.   IF you received labwork today, you will receive an invoice from [facility]. Please contact [facility] at [phone] with questions or concerns regarding your invoice.   Our billing staff will not be able to assist you with questions regarding bills from these companies.  You will be contacted with the lab results as soon as they are available. The fastest way to get your results is to activate your My Chart account. Instructions are located on the last page of this paperwork. If you have not heard from [place] regarding the results in 2 weeks, please contact this office.       Signed,   Merri Ray, MD [facility] [place].  05/14/19 10:32 AM

## 2019-05-14 ENCOUNTER — Telehealth: Payer: Self-pay | Admitting: Family Medicine

## 2019-05-14 LAB — PRO B NATRIURETIC PEPTIDE: NT-Pro BNP: 207 pg/mL (ref 0–376)

## 2019-05-14 LAB — SEDIMENTATION RATE: Sed Rate: 2 mm/hr (ref 0–30)

## 2019-05-14 NOTE — Telephone Encounter (Signed)
Pt feels he no longer needs a referral for a cardiology specialist. Please advise if you feel he needs it.

## 2019-05-15 NOTE — Telephone Encounter (Signed)
Based on his symptoms I do think he needs to be seen by cardiology.  However COVID-19 test is still pending as well.  Thanks.

## 2019-05-16 NOTE — Telephone Encounter (Signed)
Spoke with pt and informed his of doctors recommendations, he verbalized understanding.

## 2019-05-20 ENCOUNTER — Encounter: Payer: Self-pay | Admitting: Cardiology

## 2019-05-20 LAB — NOVEL CORONAVIRUS, NAA: SARS-CoV-2, NAA: NOT DETECTED

## 2019-05-21 ENCOUNTER — Telehealth: Payer: Self-pay | Admitting: General Practice

## 2019-05-21 NOTE — Telephone Encounter (Signed)
Negative COVID results given. Patient results "NOT Detected." Caller expressed understanding. ° °

## 2019-05-22 ENCOUNTER — Other Ambulatory Visit: Payer: Self-pay

## 2019-05-22 ENCOUNTER — Ambulatory Visit (INDEPENDENT_AMBULATORY_CARE_PROVIDER_SITE_OTHER): Payer: Medicare Other | Admitting: Cardiology

## 2019-05-22 ENCOUNTER — Encounter: Payer: Self-pay | Admitting: Cardiology

## 2019-05-22 VITALS — BP 163/72 | HR 51 | Temp 97.5°F | Ht 68.0 in | Wt 209.0 lb

## 2019-05-22 DIAGNOSIS — F17210 Nicotine dependence, cigarettes, uncomplicated: Secondary | ICD-10-CM | POA: Diagnosis not present

## 2019-05-22 DIAGNOSIS — R0602 Shortness of breath: Secondary | ICD-10-CM | POA: Insufficient documentation

## 2019-05-22 DIAGNOSIS — R079 Chest pain, unspecified: Secondary | ICD-10-CM | POA: Diagnosis not present

## 2019-05-22 DIAGNOSIS — Z720 Tobacco use: Secondary | ICD-10-CM | POA: Diagnosis not present

## 2019-05-22 DIAGNOSIS — Z136 Encounter for screening for cardiovascular disorders: Secondary | ICD-10-CM | POA: Insufficient documentation

## 2019-05-22 DIAGNOSIS — R0601 Orthopnea: Secondary | ICD-10-CM | POA: Insufficient documentation

## 2019-05-22 NOTE — Progress Notes (Addendum)
Patient referred by Matthew Agreste, MD for chest pain, shortness of breath  Subjective:   Matthew [name], male    DOB: 1951-07-20, 68 y.o.   MRN: [id number]   Chief Complaint  Patient presents with  . Chest Pain    chest pain for 9 days  . Shortness of Breath  . New Patient (Initial Visit)    HPI  68 year old male with hypertension, hyperlipidemia, prediabetes, tobacco abuse, referred for evaluation of chest pan, back pain, and shortness of breath.  Patient worked as a Scientist, product/process development for several years, retired 2 years ago.  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  He pressure washed his neighbor's house few days ago.  He does not have any chest pain, chest tightness, shortness of breath while doing physical work.  He has back pain which she describes as "my lungs hurt", which happens occasionally.  His symptoms of shortness of breath and chest pressure occurred 9 days ago while at rest at night.  He has noticed that he is to use 2 pillows to sleep at night.  He tells me that the symptoms occur every year around this time of year.  He is unsure if this is related to his allergies.  He has been on amlodipine and atenolol for last 3 years.  His blood pressure stays uncontrolled.  He does not check blood pressure regularly at home, but has noticed significant variability.   Past Medical History:  Diagnosis Date  . Arthritis   . Cancer ([place]) 02/11/2005   prostate cancer, gleason 3+3=6, vol 30 cc  . Chronic back pain 11/15/2012  . GERD (gastroesophageal reflux disease)   . Gout   . History of shingles   . Hx of radiation therapy 08/04/05 to 09/22/2005   prostate bed  . Hypercholesteremia   . Hypertension   . Sleep disturbance 11/15/2012  . Snoring 11/15/2012     Past Surgical History:  Procedure Laterality Date  . COLONOSCOPY    . EYE SURGERY    . HERNIA REPAIR  05/23/12   RIH  . NECK SURGERY    . PROSTATECTOMY  03/2005   Gleason 3+4=7  .  UPPER GASTROINTESTINAL ENDOSCOPY       Social History   Socioeconomic History  . Marital status: Married    Spouse name: Not on file  . Number of children: 1  . Years of education: Not on file  . Highest education level: Not on file  Occupational History  . Not on file  Social Needs  . Financial resource strain: Not on file  . Food insecurity    Worry: Not on file    Inability: Not on file  . Transportation needs    Medical: Not on file    Non-medical: Not on file  Tobacco Use  . Smoking status: Never Smoker  . Smokeless tobacco: Current User    Types: Chew, Snuff  . Tobacco comment: 50 + yrs started chewing tobacco at 68 yrs old  Substance and Sexual Activity  . Alcohol use: No  . Drug use: No  . Sexual activity: Not on file  Lifestyle  . Physical activity    Days per week: Not on file    Minutes per session: Not on file  . Stress: Not on file  Relationships  . Social Herbalist on phone: Not on file    Gets together: Not on file    Attends religious service:  Not on file    Active member of club or organization: Not on file    Attends meetings of clubs or organizations: Not on file    Relationship status: Not on file  . Intimate partner violence    Fear of current or ex partner: Not on file    Emotionally abused: Not on file    Physically abused: Not on file    Forced sexual activity: Not on file  Other Topics Concern  . Not on file  Social History Narrative   Married, 1 son, pipe fitter     Family History  Problem Relation Age of Onset  . Colon polyps Neg Hx   . Colon cancer Neg Hx   . Esophageal cancer Neg Hx   . Stomach cancer Neg Hx   . Rectal cancer Neg Hx      Current Outpatient Medications on File Prior to Visit  Medication Sig Dispense Refill  . amitriptyline (ELAVIL) 25 MG tablet Take 25 mg by mouth at bedtime.    Marland Kitchen amLODipine (NORVASC) 10 MG tablet Take 1 tablet (10 mg total) by mouth daily. 90 tablet 3  . atenolol (TENORMIN)  25 MG tablet Take 1 tablet (25 mg total) by mouth daily. 90 tablet 3  . atorvastatin (LIPITOR) 40 MG tablet Take 1 tablet (40 mg total) by mouth daily. 90 tablet 3  . bicalutamide (CASODEX) 50 MG tablet Take 50 mg by mouth daily.    . fesoterodine (TOVIAZ) 8 MG TB24 tablet Take 8 mg by mouth daily.    . finasteride (PROSCAR) 5 MG tablet Take 5 mg by mouth daily.    Marland Kitchen oxyCODONE-acetaminophen (PERCOCET) 10-325 MG tablet Take 1 tablet by mouth every 6 (six) hours as needed.   0   Current Facility-Administered Medications on File Prior to Visit  Medication Dose Route Frequency Provider Last Rate Last Dose  . 0.9 %  sodium chloride infusion  500 mL Intravenous Once Matthew Stabler, MD        Cardiovascular studies:  EKG 05/22/2019: Sinus rhythm 52 bpm. Normal EKG.   Recent labs: Results for Matthew, [name] (MRN [id number]) as of 05/22/2019 08:32  Ref. Range 02/21/2019 11:28 05/13/2019 17:20  COMPREHENSIVE METABOLIC PANEL Unknown Rpt (A)   Sodium Latest Ref Range: 134 - 144 mmol/L 142   Potassium Latest Ref Range: 3.5 - 5.2 mmol/L 4.9   Chloride Latest Ref Range: 96 - 106 mmol/L 104   CO2 Latest Ref Range: 20 - 29 mmol/L 17 (L)   Glucose Latest Ref Range: 65 - 99 mg/dL 117 (H)   BUN Latest Ref Range: 8 - 27 mg/dL 14   Creatinine Latest Ref Range: 0.76 - 1.27 mg/dL 1.19   Calcium Latest Ref Range: 8.6 - 10.2 mg/dL 9.7   BUN/Creatinine Ratio Latest Ref Range: 10 - 24  12   Alkaline Phosphatase Latest Ref Range: 39 - 117 IU/L 80   Albumin Latest Ref Range: 3.8 - 4.8 g/dL 5.0 (H)   Albumin/Globulin Ratio Latest Ref Range: 1.2 - 2.2  1.9   AST Latest Ref Range: 0 - 40 IU/L 33   ALT Latest Ref Range: 0 - 44 IU/L 25   Total Protein Latest Ref Range: 6.0 - 8.5 g/dL 7.7   Total Bilirubin Latest Ref Range: 0.0 - 1.2 mg/dL 0.7   GFR, Est Non African American Latest Ref Range: >59 mL/min/1.73 62   GFR, Est African American Latest Ref Range: >59 mL/min/1.73 72   NT-Pro BNP Latest  Ref Range: 0 -  376 pg/mL  207  Total CHOL/HDL Ratio Latest Ref Range: 0.0 - 5.0 ratio 4.8   Cholesterol, Total Latest Ref Range: 100 - 199 mg/dL 159   HDL Cholesterol Latest Ref Range: >39 mg/dL 33 (L)   LDL (calc) Latest Ref Range: 0 - 99 mg/dL 84   Triglycerides Latest Ref Range: 0 - 149 mg/dL 212 (H)   VLDL Cholesterol Cal Latest Ref Range: 5 - 40 mg/dL 42 (H)   Globulin, Total Latest Ref Range: 1.5 - 4.5 g/dL 2.7    Results for DAMITRI, ZAPANTA (MRN [id number]) as of 05/22/2019 08:32  Ref. Range 05/13/2019 17:27  WBC Latest Ref Range: 4.6 - 10.2 K/uL 8.0  RBC Latest Ref Range: 4.69 - 6.13 M/uL 4.61 (A)  Hemoglobin Latest Ref Range: 11 - 14.6 g/dL 14.2  HCT Latest Ref Range: 29 - 41 % 40.4  MCV Latest Ref Range: 76 - 111 fL 87.8  MCH Latest Ref Range: 27 - 31.2 pg 30.9  MCHC Latest Ref Range: 31.8 - 35.4 g/dL 35.2  MPV Latest Ref Range: 0 - 99.8 fL 6.8  Granulocyte percent Latest Ref Range: 37 - 80 %G 61.6   Results for NORMA, MINETTE (MRN [id number]) as of 05/22/2019 08:32  Ref. Range 02/21/2019 11:28  Glucose Latest Ref Range: 65 - 99 mg/dL 117 (H)  Hemoglobin A1C Latest Ref Range: 4.8 - 5.6 % 6.1 (H)  Est. average glucose Bld gHb Est-mCnc Latest Units: mg/dL 128    Review of Systems  Constitution: Negative for decreased appetite, malaise/fatigue, weight gain and weight loss.  HENT: Negative for congestion.   Eyes: Negative for visual disturbance.  Cardiovascular: Positive for chest pain and orthopnea. Negative for dyspnea on exertion, leg swelling, palpitations and syncope.  Respiratory: Negative for cough.   Endocrine: Negative for cold intolerance.  Hematologic/Lymphatic: Does not bruise/bleed easily.  Skin: Negative for itching and rash.  Musculoskeletal: Positive for back pain. Negative for myalgias.  Gastrointestinal: Negative for abdominal pain, nausea and vomiting.  Genitourinary: Negative for dysuria.  Neurological: Negative for dizziness and weakness.  Psychiatric/Behavioral: The  patient is not nervous/anxious.   All other systems reviewed and are negative.        Vitals:   05/22/19 0846  BP: (!) 165/74  Pulse: (!) 56  Temp: (!) 97.5 F (36.4 C)  SpO2: 98%     Body mass index is 31.78 kg/m. Filed Weights   05/22/19 0846  Weight: 94.8 kg     Objective:   Physical Exam  Constitutional: He is oriented to person, place, and time. He appears well-developed and well-nourished. No distress.  HENT:  Head: Normocephalic and atraumatic.  Eyes: Pupils are equal, round, and reactive to light. Conjunctivae are normal.  Neck: No JVD present.  Cardiovascular: Normal rate, regular rhythm and intact distal pulses.  No murmur heard. Pulmonary/Chest: Effort normal and breath sounds normal. He has no wheezes. He has no rales.  Abdominal: Soft. Bowel sounds are normal. There is no rebound.  Musculoskeletal:        General: No edema.  Lymphadenopathy:    He has no cervical adenopathy.  Neurological: He is alert and oriented to person, place, and time. No cranial nerve deficit.  Skin: Skin is warm and dry.  Psychiatric: He has a normal mood and affect.  Nursing note and vitals reviewed.         Assessment & Recommendations:   68 year old male with hypertension, hyperlipidemia, prediabetes, tobacco abuse, referred for evaluation  of chest pan, back pain, and shortness of breath.  Chest pain, shortness of breath: Symptoms do not occur with physical exertion, but only seem to occur at night.  His back pain is unlikely to be angina.  Orthopnea remains a possibility, although there are no other heart failure signs and symptoms.  Will obtain echocardiogram.  I have recommended him to use over-the-counter allergy medications.  If his symptoms do not improve with treating possible allergies, will consider exercise nuclear stress test.  Hypertension: Uncontrolled.  He is reluctant to make any changes to his medications today.  I have encouraged him to check blood  pressure regularly at home and keep a log.  Tobacco cessation counseling:  - 60 yr tobacco chewing history, currently chews daily. - Patient was informed of the dangers of tobacco abuse including stroke, cancer, and MI, as well as benefits of tobacco cessation. 5- Patient is NOT willing to quit at this time. - Approximately 5 mins were spent counseling patient cessation techniques. We discussed various methods to help quit smoking, including deciding on a date to quit, joining a support group, pharmacological agents. Patient is not ready at this time.  - I will reassess his progress at the next follow-up visit  Primary prevention: 10 yr ASCVD risk 37%. Recommend high intensity statin. Continue Lipitor 40 mg daily.  Given his tobacco history, consider lung cancer screening CT scan.  AAA screening: 60-year tobacco chewing history.  Will obtain AAA screening aorta ultrasound.  Thank you for referring the patient to [place]. Please feel free to contact with any questions. Will follow up in 4 weeks.   Nigel Mormon, MD [facility] [place] Pager: [phone] Office: [phone] If no answer Cell [phone]

## 2019-05-28 ENCOUNTER — Encounter (INDEPENDENT_AMBULATORY_CARE_PROVIDER_SITE_OTHER): Payer: Self-pay | Admitting: Otolaryngology

## 2019-05-28 ENCOUNTER — Ambulatory Visit (INDEPENDENT_AMBULATORY_CARE_PROVIDER_SITE_OTHER): Payer: Medicare Other | Admitting: Otolaryngology

## 2019-05-28 ENCOUNTER — Other Ambulatory Visit: Payer: Self-pay

## 2019-05-28 VITALS — Temp 97.3°F

## 2019-05-28 DIAGNOSIS — J342 Deviated nasal septum: Secondary | ICD-10-CM | POA: Diagnosis not present

## 2019-05-28 DIAGNOSIS — J310 Chronic rhinitis: Secondary | ICD-10-CM

## 2019-05-28 NOTE — Progress Notes (Signed)
HPI: Matthew [name] is a 68 y.o. male who presents is referred by Dr. Carlota Raspberry for evaluation of chronic nasal obstruction.  He is also seeing cardiologist because of complaints of pressure on his chest.  Concerning his nasal obstruction he has some nasal obstruction throughout the day this been going on for couple months but at night when he lies down and sleeps his nasal obstruction is much worse and he frequently wakes up because of nasal obstruction.  He seems to have chronic symptoms with this most falls in this may be related to seasonal allergies in the fall.  He denies any yellow-green discharge from his nose and his main problem seems to be mostly just nasal obstruction which is what much worse at night.  He has tried Flonase without much benefit.  He states that Vicks VapoRub helps with some. He chews tobacco mostly on the left side..  Past Medical History:  Diagnosis Date  . Arthritis   . Cancer ([place]) 02/11/2005   prostate cancer, gleason 3+3=6, vol 30 cc  . Chronic back pain 11/15/2012  . GERD (gastroesophageal reflux disease)   . Gout   . History of shingles   . Hx of radiation therapy 08/04/05 to 09/22/2005   prostate bed  . Hypercholesteremia   . Hypertension   . Sleep disturbance 11/15/2012  . Snoring 11/15/2012   Past Surgical History:  Procedure Laterality Date  . COLONOSCOPY    . EYE SURGERY    . HERNIA REPAIR  05/23/12   RIH  . NECK SURGERY    . PROSTATECTOMY  03/2005   Gleason 3+4=7  . UPPER GASTROINTESTINAL ENDOSCOPY     Social History   Socioeconomic History  . Marital status: Married    Spouse name: Not on file  . Number of children: 1  . Years of education: Not on file  . Highest education level: Not on file  Occupational History  . Not on file  Social Needs  . Financial resource strain: Not on file  . Food insecurity    Worry: Not on file    Inability: Not on file  . Transportation needs    Medical: Not on file    Non-medical: Not on file  Tobacco Use  .  Smoking status: Never Smoker  . Smokeless tobacco: Current User    Types: Chew, Snuff  . Tobacco comment: 50 + yrs started chewing tobacco at 68 yrs old  Substance and Sexual Activity  . Alcohol use: No  . Drug use: No  . Sexual activity: Not on file  Lifestyle  . Physical activity    Days per week: Not on file    Minutes per session: Not on file  . Stress: Not on file  Relationships  . Social Herbalist on phone: Not on file    Gets together: Not on file    Attends religious service: Not on file    Active member of club or organization: Not on file    Attends meetings of clubs or organizations: Not on file    Relationship status: Not on file  Other Topics Concern  . Not on file  Social History Narrative   Married, 1 son, pipe fitter   Family History  Problem Relation Age of Onset  . Colon polyps Neg Hx   . Colon cancer Neg Hx   . Esophageal cancer Neg Hx   . Stomach cancer Neg Hx   . Rectal cancer Neg Hx    No  Known Allergies Prior to Admission medications   Medication Sig Start Date End Date Taking? Authorizing Provider  amitriptyline (ELAVIL) 25 MG tablet Take 25 mg by mouth at bedtime.    [provider]  amLODipine (NORVASC) 10 MG tablet Take 1 tablet (10 mg total) by mouth daily. 02/21/19   Wendie Agreste, MD  atenolol (TENORMIN) 25 MG tablet Take 1 tablet (25 mg total) by mouth daily. 02/21/19   Wendie Agreste, MD  atorvastatin (LIPITOR) 40 MG tablet Take 1 tablet (40 mg total) by mouth daily. 02/21/19   Wendie Agreste, MD  bicalutamide (CASODEX) 50 MG tablet Take 50 mg by mouth daily.    [provider]  fesoterodine (TOVIAZ) 8 MG TB24 tablet Take 8 mg by mouth daily.    [provider]  finasteride (PROSCAR) 5 MG tablet Take 5 mg by mouth daily.    [provider]  oxyCODONE-acetaminophen (PERCOCET) 10-325 MG tablet Take 1 tablet by mouth every 6 (six) hours as needed.  10/12/17   [provider]      Positive ROS: He complains of pressure on his chest especially at night.  He is presently seeing cardiology he has also had problems with trouble swallowing in the past has seen GI and had his esophagus stretched.  All other systems have been reviewed and were otherwise negative with the exception of those mentioned in the HPI and as above.  Physical Exam: General: Alert, no acute distress Ears: Ear canals are clear bilaterally with intact, clear TMs Nasal: Clear nasal passages.  He has moderate rhinitis with moderate septal deflection.  No polyps noted middle meatus was clear bilaterally.  Nasal endoscopy revealed clear posterior nasal cavity.  No obvious mucopurulent discharge noted.  Nasopharynx was clear. Oral: Clear oropharynx.  The left buccal mucosa is slightly irritated from use of chewing tobacco but no lesions noted no significant leukoplakia no sores in the area soft to palpation.  Patient has no premalignant changes of the oral mucosa.  Patient has a strong gag reflex. Fiberoptic laryngoscopy through the right nostril revealed clear nasopharynx, base of tongue vallecula epiglottis were normal.  Vocal cords were clear bilaterally with wide appearing glottic gap and normal vocal cord mobility. Neck: No palpable adenopathy or masses   Assessment: Chronic rhinitis with moderate septal deflection.  Plan: Recommended regular use of Nasacort 2 sprays each nostril at night as well as use of saline nasal irrigations during the day. Prescribed a dose of Z-Pak as he stated that he has taken this in the past with good success although on clinical exam there is really no evidence of active infection.   Radene Journey, MD   CC:

## 2019-06-04 ENCOUNTER — Ambulatory Visit (INDEPENDENT_AMBULATORY_CARE_PROVIDER_SITE_OTHER): Payer: Medicare Other

## 2019-06-04 ENCOUNTER — Other Ambulatory Visit: Payer: Self-pay

## 2019-06-04 DIAGNOSIS — Z720 Tobacco use: Secondary | ICD-10-CM | POA: Diagnosis not present

## 2019-06-04 DIAGNOSIS — I700 Atherosclerosis of aorta: Secondary | ICD-10-CM

## 2019-06-04 DIAGNOSIS — Z136 Encounter for screening for cardiovascular disorders: Secondary | ICD-10-CM

## 2019-06-04 DIAGNOSIS — R0602 Shortness of breath: Secondary | ICD-10-CM | POA: Diagnosis not present

## 2019-06-05 NOTE — Progress Notes (Signed)
Patient referred by Wendie Agreste, MD for chest pain, shortness of breath  Subjective:   Matthew [name], male    DOB: 01-20-1951, 68 y.o.   MRN: [id number]   Chief Complaint  Patient presents with  . Chest Pain    follow up    HPI  68 year old male with hypertension, hyperlipidemia, prediabetes, tobacco abuse, grade II diastolic dysfunction, mild PH    Echocardiogram Normal EF, grade II diastolic dysfunction with elevated LAP, mod mitral and tricuspid regurgitation, PASP 50 mmHg. In the meantime., he also saw ENT. He reports that his symptoms of night time shortness of breath have improved after using nasal spray. He walks up to a mile, without nay symptoms of chest pain or shortness of breath.    Initial consult note HPI 05/22/2019: Patient worked as a Scientist, product/process development for several years, retired 2 years ago.  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  He pressure washed his neighbor's house few days ago.  He does not have any chest pain, chest tightness, shortness of breath while doing physical work.  He has back pain which she describes as "my lungs hurt", which happens occasionally.  His symptoms of shortness of breath and chest pressure occurred 9 days ago while at rest at night.  He has noticed that he is to use 2 pillows to sleep at night.  He tells me that the symptoms occur every year around this time of year.  He is unsure if this is related to his allergies.  He has been on amlodipine and atenolol for last 3 years.  His blood pressure stays uncontrolled.  He does not check blood pressure regularly at home, but has noticed significant variability.   Past Medical History:  Diagnosis Date  . Arthritis   . Cancer ([place]) 02/11/2005   prostate cancer, gleason 3+3=6, vol 30 cc  . Chronic back pain 11/15/2012  . GERD (gastroesophageal reflux disease)   . Gout   . History of shingles   . Hx of radiation therapy 08/04/05 to 09/22/2005   prostate bed  .  Hypercholesteremia   . Hypertension   . Sleep disturbance 11/15/2012  . Snoring 11/15/2012     Past Surgical History:  Procedure Laterality Date  . COLONOSCOPY    . EYE SURGERY    . HERNIA REPAIR  05/23/12   RIH  . NECK SURGERY    . PROSTATECTOMY  03/2005   Gleason 3+4=7  . UPPER GASTROINTESTINAL ENDOSCOPY       Social History   Socioeconomic History  . Marital status: Married    Spouse name: Not on file  . Number of children: 1  . Years of education: Not on file  . Highest education level: Not on file  Occupational History  . Not on file  Social Needs  . Financial resource strain: Not on file  . Food insecurity    Worry: Not on file    Inability: Not on file  . Transportation needs    Medical: Not on file    Non-medical: Not on file  Tobacco Use  . Smoking status: Never Smoker  . Smokeless tobacco: Current User    Types: Chew, Snuff  . Tobacco comment: 50 + yrs started chewing tobacco at 68 yrs old  Substance and Sexual Activity  . Alcohol use: No  . Drug use: No  . Sexual activity: Not on file  Lifestyle  . Physical activity    Days per  week: Not on file    Minutes per session: Not on file  . Stress: Not on file  Relationships  . Social Herbalist on phone: Not on file    Gets together: Not on file    Attends religious service: Not on file    Active member of club or organization: Not on file    Attends meetings of clubs or organizations: Not on file    Relationship status: Not on file  . Intimate partner violence    Fear of current or ex partner: Not on file    Emotionally abused: Not on file    Physically abused: Not on file    Forced sexual activity: Not on file  Other Topics Concern  . Not on file  Social History Narrative   Married, 1 son, pipe fitter     Family History  Problem Relation Age of Onset  . Colon polyps Neg Hx   . Colon cancer Neg Hx   . Esophageal cancer Neg Hx   . Stomach cancer Neg Hx   . Rectal cancer Neg Hx       Current Outpatient Medications on File Prior to Visit  Medication Sig Dispense Refill  . amitriptyline (ELAVIL) 25 MG tablet Take 25 mg by mouth at bedtime.    Marland Kitchen amLODipine (NORVASC) 10 MG tablet Take 1 tablet (10 mg total) by mouth daily. 90 tablet 3  . atenolol (TENORMIN) 25 MG tablet Take 1 tablet (25 mg total) by mouth daily. 90 tablet 3  . atorvastatin (LIPITOR) 40 MG tablet Take 1 tablet (40 mg total) by mouth daily. 90 tablet 3  . bicalutamide (CASODEX) 50 MG tablet Take 50 mg by mouth daily.    . fesoterodine (TOVIAZ) 8 MG TB24 tablet Take 8 mg by mouth daily.    . finasteride (PROSCAR) 5 MG tablet Take 5 mg by mouth daily.    Marland Kitchen oxyCODONE-acetaminophen (PERCOCET) 10-325 MG tablet Take 1 tablet by mouth every 6 (six) hours as needed.   0   Current Facility-Administered Medications on File Prior to Visit  Medication Dose Route Frequency Provider Last Rate Last Dose  . 0.9 %  sodium chloride infusion  500 mL Intravenous Once Doran Stabler, MD        Cardiovascular studies:  Abdominal Aortic Duplex  06/04/2019: The maximum aorta (sac) diameter is 2.34 cm (prox). Mild plaque observed in the proximal, mid and distal aorta. Peak systolic velocities in the left internal iliac artery are mildly increased to 150.87 cm/s suggests <50% stenosis.  No AAA noted.   Echocardiogram 06/04/2019: Left ventricle cavity is normal in size. Normal left ventricular wall thickness. Normal LV systolic function with EF 55%. Normal global wall motion. Doppler evidence of grade II (pseudonormal) diastolic dysfunction, elevated LAP. Left atrial cavity is moderately dilated. Right atrial cavity is mildly dilated. Moderate (Grade II) mitral regurgitation. Moderate tricuspid regurgitation. Mild pulmonary hypertension. Estimated pulmonary artery systolic pressure is 50 mmHg.  IVC is dilated with blunted respiratory response. Estimated RA pressure 8 mmHg.  EKG 05/22/2019: Sinus rhythm 52 bpm. Normal  EKG.   Recent labs: Results for Matthew [name], Matthew [name] (MRN [id number]) as of 05/22/2019 08:32  Ref. Range 02/21/2019 11:28 05/13/2019 17:20  COMPREHENSIVE METABOLIC PANEL Unknown Rpt (A)   Sodium Latest Ref Range: 134 - 144 mmol/L 142   Potassium Latest Ref Range: 3.5 - 5.2 mmol/L 4.9   Chloride Latest Ref Range: 96 - 106 mmol/L 104   CO2 Latest  Ref Range: 20 - 29 mmol/L 17 (L)   Glucose Latest Ref Range: 65 - 99 mg/dL 117 (H)   BUN Latest Ref Range: 8 - 27 mg/dL 14   Creatinine Latest Ref Range: 0.76 - 1.27 mg/dL 1.19   Calcium Latest Ref Range: 8.6 - 10.2 mg/dL 9.7   BUN/Creatinine Ratio Latest Ref Range: 10 - 24  12   Alkaline Phosphatase Latest Ref Range: 39 - 117 IU/L 80   Albumin Latest Ref Range: 3.8 - 4.8 g/dL 5.0 (H)   Albumin/Globulin Ratio Latest Ref Range: 1.2 - 2.2  1.9   AST Latest Ref Range: 0 - 40 IU/L 33   ALT Latest Ref Range: 0 - 44 IU/L 25   Total Protein Latest Ref Range: 6.0 - 8.5 g/dL 7.7   Total Bilirubin Latest Ref Range: 0.0 - 1.2 mg/dL 0.7   GFR, Est Non African American Latest Ref Range: >59 mL/min/1.73 62   GFR, Est African American Latest Ref Range: >59 mL/min/1.73 72   NT-Pro BNP Latest Ref Range: 0 - 376 pg/mL  207  Total CHOL/HDL Ratio Latest Ref Range: 0.0 - 5.0 ratio 4.8   Cholesterol, Total Latest Ref Range: 100 - 199 mg/dL 159   HDL Cholesterol Latest Ref Range: >39 mg/dL 33 (L)   LDL (calc) Latest Ref Range: 0 - 99 mg/dL 84   Triglycerides Latest Ref Range: 0 - 149 mg/dL 212 (H)   VLDL Cholesterol Cal Latest Ref Range: 5 - 40 mg/dL 42 (H)   Globulin, Total Latest Ref Range: 1.5 - 4.5 g/dL 2.7    Results for Matthew [name], Matthew [name] (MRN [id number]) as of 05/22/2019 08:32  Ref. Range 05/13/2019 17:27  WBC Latest Ref Range: 4.6 - 10.2 K/uL 8.0  RBC Latest Ref Range: 4.69 - 6.13 M/uL 4.61 (A)  Hemoglobin Latest Ref Range: 11 - 14.6 g/dL 14.2  HCT Latest Ref Range: 29 - 41 % 40.4  MCV Latest Ref Range: 76 - 111 fL 87.8  MCH Latest Ref Range: 27 - 31.2 pg 30.9  MCHC  Latest Ref Range: 31.8 - 35.4 g/dL 35.2  MPV Latest Ref Range: 0 - 99.8 fL 6.8  Granulocyte percent Latest Ref Range: 37 - 80 %G 61.6   Results for Matthew [name], Matthew [name] (MRN [id number]) as of 05/22/2019 08:32  Ref. Range 02/21/2019 11:28  Glucose Latest Ref Range: 65 - 99 mg/dL 117 (H)  Hemoglobin A1C Latest Ref Range: 4.8 - 5.6 % 6.1 (H)  Est. average glucose Bld gHb Est-mCnc Latest Units: mg/dL 128    Review of Systems  Constitution: Negative for decreased appetite, malaise/fatigue, weight gain and weight loss.  HENT: Negative for congestion.   Eyes: Negative for visual disturbance.  Cardiovascular: Positive for chest pain and orthopnea. Negative for dyspnea on exertion, leg swelling, palpitations and syncope.  Respiratory: Negative for cough.   Endocrine: Negative for cold intolerance.  Hematologic/Lymphatic: Does not bruise/bleed easily.  Skin: Negative for itching and rash.  Musculoskeletal: Positive for back pain. Negative for myalgias.  Gastrointestinal: Negative for abdominal pain, nausea and vomiting.  Genitourinary: Negative for dysuria.  Neurological: Negative for dizziness and weakness.  Psychiatric/Behavioral: The patient is not nervous/anxious.   All other systems reviewed and are negative.        Vitals:   06/19/19 1425  BP: (!) 131/59  Pulse: 60    Body mass index is 32.69 kg/m. Filed Weights   06/19/19 1425  Weight: 215 lb (97.5 kg)     Objective:   Physical Exam  Not performed. Telephone visit.        Assessment & Recommendations:   68 year old male with hypertension, hyperlipidemia, prediabetes, tobacco abuse, grade II diastolic dysfunction, mild PH   Chest pain, shortness of breath: No recurrent chest pain. Shortness of breath at night has resolved after ENT treatment with saline spray. 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 Thus, do not recommend any  further testing at this time.   Hypertension: Better controlled. Continue current antihypertensive therapy.   Primary prevention: 10 yr ASCVD risk 37%. Recommend high intensity statin. Continue Lipitor 40 mg daily.  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 Continue medical therapy.   Given his tobacco history, consider lung cancer screening CT scan.  F/u in 6 months  Paloma Grange Esther Hardy, MD [facility] Pager: [phone] Office: [phone] If no answer Cell [phone]

## 2019-06-19 ENCOUNTER — Telehealth (INDEPENDENT_AMBULATORY_CARE_PROVIDER_SITE_OTHER): Payer: Medicare Other | Admitting: Cardiology

## 2019-06-19 ENCOUNTER — Encounter: Payer: Self-pay | Admitting: Cardiology

## 2019-06-19 VITALS — BP 131/59 | HR 60 | Ht 68.0 in | Wt 215.0 lb

## 2019-06-19 DIAGNOSIS — I10 Essential (primary) hypertension: Secondary | ICD-10-CM | POA: Diagnosis not present

## 2019-06-19 DIAGNOSIS — I739 Peripheral vascular disease, unspecified: Secondary | ICD-10-CM | POA: Diagnosis not present

## 2019-07-26 HISTORY — PX: COLONOSCOPY: SHX174

## 2019-08-08 DIAGNOSIS — M47817 Spondylosis without myelopathy or radiculopathy, lumbosacral region: Secondary | ICD-10-CM | POA: Diagnosis not present

## 2019-08-08 DIAGNOSIS — M461 Sacroiliitis, not elsewhere classified: Secondary | ICD-10-CM | POA: Diagnosis not present

## 2019-08-08 DIAGNOSIS — M5137 Other intervertebral disc degeneration, lumbosacral region: Secondary | ICD-10-CM | POA: Diagnosis not present

## 2019-08-08 DIAGNOSIS — G894 Chronic pain syndrome: Secondary | ICD-10-CM | POA: Diagnosis not present

## 2019-08-27 ENCOUNTER — Ambulatory Visit: Payer: Medicare Other | Admitting: Family Medicine

## 2019-08-29 ENCOUNTER — Ambulatory Visit (INDEPENDENT_AMBULATORY_CARE_PROVIDER_SITE_OTHER): Payer: Medicare Other | Admitting: Family Medicine

## 2019-08-29 ENCOUNTER — Other Ambulatory Visit: Payer: Self-pay

## 2019-08-29 VITALS — BP 160/72 | HR 75 | Temp 97.9°F | Ht 68.0 in | Wt 215.0 lb

## 2019-08-29 DIAGNOSIS — R0981 Nasal congestion: Secondary | ICD-10-CM | POA: Diagnosis not present

## 2019-08-29 DIAGNOSIS — M47812 Spondylosis without myelopathy or radiculopathy, cervical region: Secondary | ICD-10-CM

## 2019-08-29 DIAGNOSIS — M542 Cervicalgia: Secondary | ICD-10-CM | POA: Diagnosis not present

## 2019-08-29 DIAGNOSIS — M62838 Other muscle spasm: Secondary | ICD-10-CM | POA: Diagnosis not present

## 2019-08-29 DIAGNOSIS — E785 Hyperlipidemia, unspecified: Secondary | ICD-10-CM

## 2019-08-29 DIAGNOSIS — I10 Essential (primary) hypertension: Secondary | ICD-10-CM

## 2019-08-29 DIAGNOSIS — Z23 Encounter for immunization: Secondary | ICD-10-CM

## 2019-08-29 DIAGNOSIS — Z0001 Encounter for general adult medical examination with abnormal findings: Secondary | ICD-10-CM

## 2019-08-29 DIAGNOSIS — Z0000 Encounter for general adult medical examination without abnormal findings: Secondary | ICD-10-CM

## 2019-08-29 DIAGNOSIS — R7303 Prediabetes: Secondary | ICD-10-CM

## 2019-08-29 MED ORDER — AZITHROMYCIN 250 MG PO TABS: ORAL_TABLET | ORAL | 0 refills | Status: DC

## 2019-08-29 MED ORDER — PREDNISONE 20 MG PO TABS: 40.0000 mg | ORAL_TABLET | Freq: Every day | ORAL | 0 refills | Status: DC

## 2019-08-29 MED ORDER — SHINGRIX 50 MCG/0.5ML IM SUSR: 0.5000 mL | Freq: Once | INTRAMUSCULAR | 1 refills | Status: AC

## 2019-08-29 NOTE — Progress Notes (Signed)
Subjective:  Patient ID: Matthew [name], male    DOB: 1951/07/19  Age: 69 y.o. MRN: [id number]  CC:  Chief Complaint  Patient presents with  . Medicare Wellness    pt has a "cric" , and some sinus issues. In his neck, but other than that pt stats he feels great.    HPI [name] presents for  Medicare annual wellness exam.   Care team: Cardiology: Patwardhan ENT: Lucia Gaskins Pain mgt: Greta Doom Urology: D[place]t  Followed by pain management, physical medicine and rehab with Dr. Greta Doom.  Cervical spondylosis, lumbar spondylosis, sacroiliitis. Crick in the neck at times. Past 3 days sore on R neck - sore to turn to right. No injury. Cyclobenzaprine-gabapentin tid from Dr. Greta Doom.  Tx: hot and cold compress - some better past few days.   Chronic rhinitis, treated by ENT Dr. Lucia Gaskins in November.  Recommended regular use of Nasacort each night saline nasal irrigations during the day.  Has been off saline and nasacort for past few weeks - ran out. Did not buy more. Has not been using saline nasal spray. No fever. zpak has worked for sinuses in past.   Has been yellow discharge from nose past 3-4 days, mixed with blood at times, headache in back of neck on R side. No fever.  No change in taste/smell.  No new cough/dyspnea. No covid exposures known.   Prediabetes: No recent exercise.  Drinks pepsi and tea 3 times per day.  Lab Results  Component Value Date   HGBA1C 6.1 (H) 02/21/2019   Wt Readings from Last 3 Encounters:  08/29/19 215 lb (97.5 kg)  06/19/19 215 lb (97.5 kg)  05/22/19 209 lb (94.8 kg)    Hypertension: Amlodipine 10 mg daily, atenolol 25 mg daily.  Followed by cardiology, appointment November 25 reviewed.  Grade 2 diastolic dysfunction, mild pulmonary hypertension.  Improved blood pressure control at that visit. No missed doses, no new side effects.  Home readings: 140/70 usually - no recent readings.  BP Readings from Last 3 Encounters:  08/29/19 (!) 160/72  06/19/19 (!)  131/59  05/22/19 (!) 163/72   Lab Results  Component Value Date   CREATININE 1.19 02/21/2019   Hyperlipidemia: Lipitor 40 mg daily.  No new myalgias.  Lab Results  Component Value Date   CHOL 159 02/21/2019   HDL 33 (L) 02/21/2019   LDLCALC 84 02/21/2019   TRIG 212 (H) 02/21/2019   CHOLHDL 4.8 02/21/2019   Lab Results  Component Value Date   ALT 25 02/21/2019   AST 33 02/21/2019   ALKPHOS 80 02/21/2019   BILITOT 0.7 02/21/2019   Cancer screening: History of prostate cancer Urologist Dr. Diona Fanti, last appointment February 13, 2019.  History of radical prostatectomy in 2006.  Elevating PSA trend, with additional medication treatment.  Excellent PSA response with off label therapy with finasteride and bicalutamide.  69-monthfollow-up planned - appt last week - told was doing great.   Colonoscopy 05/05/2016.  Immunization History  Administered Date(s) Administered  . Influenza Split 04/15/2013, 05/18/2014, 04/28/2015  . Influenza Whole 05/01/2012  . Influenza-Unspecified 04/07/2016, 03/17/2017  . Pneumococcal Conjugate-13 11/19/2013  . Pneumococcal Polysaccharide-23 06/19/2012, 08/21/2018  . Tdap 06/19/2012  . Zoster 12/10/2013  Shingles vaccine, status post Zostavax as above. Has not had shingrix.  covid vaccine tomorrow.   Fall Risk  08/29/2019 05/13/2019 02/21/2019 08/21/2018 05/17/2018  Falls in the past year? 0 0 0 0 No  Number falls in past yr: 0 0 0 - -  Injury with Fall? 0 0 0 - -  Follow up - Falls evaluation completed Falls evaluation completed - -   Depression screen [facility] 2/9 08/29/2019 05/13/2019 02/21/2019 08/21/2018 05/17/2018  Decreased Interest 0 0 0 0 0  Down, Depressed, Hopeless 0 0 0 0 0  PHQ - 2 Score 0 0 0 0 0     Hearing Screening   125Hz 250Hz 500Hz 1000Hz 2000Hz 3000Hz 4000Hz 6000Hz 8000Hz  Right ear:           Left ear:             Visual Acuity Screening   Right eye Left eye Both eyes  Without correction:     With correction: 20/20 20/20 20/20   optho appt next week.   Functional Status Survey: Is the patient deaf or have difficulty hearing?: No Does the patient have difficulty seeing, even when wearing glasses/contacts?: No Does the patient have difficulty concentrating, remembering, or making decisions?: No Does the patient have difficulty walking or climbing stairs?: No Does the patient have difficulty dressing or bathing?: No Does the patient have difficulty doing errands alone such as visiting a doctor's office or shopping?: No    6CIT Screen 08/29/2019 08/21/2018  What Year? 0 points 0 points  What month? 0 points 0 points  What time? 0 points 0 points  Count back from 20 0 points 0 points  Months in reverse 4 points 0 points  Repeat phrase 2 points 0 points  Total Score 6 0  denies memory issues. ADL's, IADL's intact. No memory issues reported by family.   Dental: last visit year or two ago. Plans to have teeth pulled.   Exercise: less lately due to weather.     Office Visit from 08/29/2019 in Primary Care at [facility]  AUDIT-C Score  0          History Patient Active Problem List   Diagnosis Date Noted  . PAD (peripheral artery disease) ([place]) 06/19/2019  . Chest pain 05/22/2019  . Orthopnea 05/22/2019  . Screening for AAA (abdominal aortic aneurysm) 05/22/2019  . Tobacco abuse 05/22/2019  . Impingement syndrome of right shoulder region 01/18/2018  . Hypertension 07/10/2017  . Pain of both shoulder joints 05/03/2017  . Sleep disturbance 11/15/2012  . Snoring 11/15/2012  . Chronic back pain 11/15/2012  . Cancer ([place]) 02/11/2005   Past Medical History:  Diagnosis Date  . Arthritis   . Cancer ([place]) 02/11/2005   prostate cancer, gleason 3+3=6, vol 30 cc  . Chronic back pain 11/15/2012  . GERD (gastroesophageal reflux disease)   . Gout   . History of shingles   . Hx of radiation therapy 08/04/05 to 09/22/2005   prostate bed  . Hypercholesteremia   . Hypertension   . Sleep disturbance 11/15/2012  .  Snoring 11/15/2012   Past Surgical History:  Procedure Laterality Date  . COLONOSCOPY    . EYE SURGERY    . HERNIA REPAIR  05/23/12   RIH  . NECK SURGERY    . PROSTATECTOMY  03/2005   Gleason 3+4=7  . UPPER GASTROINTESTINAL ENDOSCOPY     No Known Allergies Prior to Admission medications   Medication Sig Start Date End Date Taking? Authorizing Provider  amitriptyline (ELAVIL) 25 MG tablet Take 25 mg by mouth at bedtime.   Yes [provider]  amLODipine (NORVASC) 10 MG tablet Take 1 tablet (10 mg total) by mouth daily. 02/21/19  Yes Wendie Agreste, MD  atenolol (TENORMIN) 25  MG tablet Take 1 tablet (25 mg total) by mouth daily. 02/21/19  Yes Wendie Agreste, MD  atorvastatin (LIPITOR) 40 MG tablet Take 1 tablet (40 mg total) by mouth daily. 02/21/19  Yes Wendie Agreste, MD  bicalutamide (CASODEX) 50 MG tablet Take 50 mg by mouth daily.   Yes [provider]  Cyclobenzaprine-Gabapentin 10 & 300 MG THPK SMARTSIG:1 Tablet(s) By Mouth 3 Times Daily PRN 06/13/19  Yes [provider]  finasteride (PROSCAR) 5 MG tablet Take 5 mg by mouth daily.   Yes [provider]  oxyCODONE-acetaminophen (PERCOCET) 10-325 MG tablet Take 1 tablet by mouth every 6 (six) hours as needed.  10/12/17  Yes [provider]  fesoterodine (TOVIAZ) 8 MG TB24 tablet Take 8 mg by mouth daily.    [provider]   Social History   Socioeconomic History  . Marital status: Married    Spouse name: Not on file  . Number of children: 1  . Years of education: Not on file  . Highest education level: Not on file  Occupational History  . Not on file  Tobacco Use  . Smoking status: Never Smoker  . Smokeless tobacco: Current User    Types: Chew, Snuff  . Tobacco comment: 50 + yrs started chewing tobacco at 69 yrs old  Substance and Sexual Activity  . Alcohol use: No  . Drug use: No  . Sexual activity: Not on file  Other Topics Concern  . Not on file  Social  History Narrative   Married, 1 son, pipe fitter   Social Determinants of Health   Financial Resource Strain:   . Difficulty of Paying Living Expenses: Not on file  Food Insecurity:   . Worried About Charity fundraiser in the Last Year: Not on file  . Ran Out of Food in the Last Year: Not on file  Transportation Needs:   . Lack of Transportation (Medical): Not on file  . Lack of Transportation (Non-Medical): Not on file  Physical Activity:   . Days of Exercise per Week: Not on file  . Minutes of Exercise per Session: Not on file  Stress:   . Feeling of Stress : Not on file  Social Connections:   . Frequency of Communication with Friends and Family: Not on file  . Frequency of Social Gatherings with Friends and Family: Not on file  . Attends Religious Services: Not on file  . Active Member of Clubs or Organizations: Not on file  . Attends Archivist Meetings: Not on file  . Marital Status: Not on file  Intimate Partner Violence:   . Fear of Current or Ex-Partner: Not on file  . Emotionally Abused: Not on file  . Physically Abused: Not on file  . Sexually Abused: Not on file    Review of Systems   Objective:   Vitals:   08/29/19 0839 08/29/19 0959  BP: (!) 170/76 (!) 160/72  Pulse: 75   Temp: 97.9 F (36.6 C)   TempSrc: Temporal   SpO2: 96%   Weight: 215 lb (97.5 kg)   Height: 5' 8" (1.727 m)      Physical Exam Vitals reviewed.  Constitutional:      Appearance: He is well-developed.  HENT:     Head: Normocephalic and atraumatic.     Right Ear: External ear normal.     Left Ear: External ear normal.     Nose:     Comments: Sinuses nontender, no lymphadenopathy.  Eyes:     Conjunctiva/sclera: Conjunctivae normal.     Pupils: Pupils are equal, round, and reactive to light.  Neck:     Thyroid: No thyromegaly.     Vascular: No carotid bruit or JVD.  Cardiovascular:     Rate and Rhythm: Normal rate and regular rhythm.     Heart sounds: Normal heart  sounds. No murmur.  Pulmonary:     Effort: Pulmonary effort is normal. No respiratory distress.     Breath sounds: Normal breath sounds. No wheezing or rales.  Abdominal:     General: There is no distension.     Palpations: Abdomen is soft.     Tenderness: There is no abdominal tenderness.  Musculoskeletal:        General: No tenderness. Normal range of motion.     Cervical back: Normal range of motion and neck supple.     Comments: Neck, no midline bony tenderness, slight decreased range of motion globally, but primarily limited with right rotation, right lateral flexion.  Spasm with slight tenderness of the right paraspinal muscles.  No arm radiation of symptoms with exam or on history  Lymphadenopathy:     Cervical: No cervical adenopathy.  Skin:    General: Skin is warm and dry.  Neurological:     Mental Status: He is alert and oriented to person, place, and time.     Deep Tendon Reflexes: Reflexes are normal and symmetric.  Psychiatric:        Behavior: Behavior normal.       Assessment & Plan:  Asiel Chrostowski is a 69 y.o. male . Medicare annual wellness visit, subsequent  - - anticipatory guidance as below in AVS, screening labs if needed. Health maintenance items as above in HPI discussed/recommended as applicable.  - no concerning responses on depression, fall, or functional status screening. Any positive responses noted as above. Advanced directives discussed as in CHL.   Neck pain - Plan: predniSONE (DELTASONE) 20 MG tablet Muscle spasms of neck - Plan: predniSONE (DELTASONE) 20 MG tablet Cervical spondylosis - Plan: predniSONE (DELTASONE) 20 MG tablet  -Continue heat/ice, gentle stretches.  Currently on muscle relaxant, gabapentin from pain management.  No additional medications given at present but option of prednisone short-term if not continuing to improve.  Potential side effects and risk of this medication were discussed.  Sinus congestion - Plan: azithromycin  (ZITHROMAX) 250 MG tablet  -Possible early sinusitis, option of azithromycin if not improving with saline nasal spray.  Potential side effects discussed  Need for shingles vaccine - Plan: Zoster Vaccine Adjuvanted [facility]) injection to pharmacy  Prediabetes - Plan: Hemoglobin A1c, Comprehensive metabolic panel  -Watch diet and exercise, check A1c, labs.  Essential hypertension - Plan: Comprehensive metabolic panel  -Elevated in office, possible pain component.  Monitor home readings and if elevated return to clinic precautions discussed for med regimen adjustment.  RTC/ER precautions.  Check labs.  Hyperlipidemia, unspecified hyperlipidemia type - Plan: Lipid panel, Comprehensive metabolic panel  -  Stable, tolerating current regimen. Medications refilled. Labs pending as above.     Meds ordered this encounter  Medications  . azithromycin (ZITHROMAX) 250 MG tablet    Sig: Take 2 pills by mouth on day 1, then 1 pill by mouth per day on days 2 through 5.    Dispense:  6 tablet    Refill:  0  . predniSONE (DELTASONE) 20 MG tablet    Sig: Take 2 tablets (40 mg total) by mouth daily  with breakfast.    Dispense:  10 tablet    Refill:  0  . Zoster Vaccine Adjuvanted [facility]) injection    Sig: Inject 0.5 mLs into the muscle once for 1 dose. Repeat in 2-6 months.    Dispense:  0.5 mL    Refill:  1   Patient Instructions   For your neck pain - you are already on a muscle relaxer from Dr. Greta Doom. Continue warm and cool compresses if those are helping.  If not continuing to improve in next few days, you can fill prescription for prednisone.   Restart saline nasal spray and nasonex. I also wrote for antibiotic azithromycin to fill if discoloration not improving or increased sinus pressure/pain. Return to the clinic or go to the nearest emergency room if any of your symptoms worsen or new symptoms occur.  You have prediabetes - cut back on sugar containing beverages. See other info below.     Blood pressure slightly elevated today but that can be related to pain at times.  Monitor your blood pressures at home and if those are over 140/90, schedule visit with me in the next week or 2 to adjust your medications.  Recheck in 3 months regardless.  Return to the clinic or go to the nearest emergency room if any of your symptoms worsen or new symptoms occur.   Preventive Care 51 Years and Older, Male Preventive care refers to lifestyle choices and visits with your health care provider that can promote health and wellness. This includes:  A yearly physical exam. This is also called an annual well check.  Regular dental and eye exams.  Immunizations.  Screening for certain conditions.  Healthy lifestyle choices, such as diet and exercise. What can I expect for my preventive care visit? Physical exam Your health care provider will check:  Height and weight. These may be used to calculate body mass index (BMI), which is a measurement that tells if you are at a healthy weight.  Heart rate and blood pressure.  Your skin for abnormal spots. Counseling Your health care provider may ask you questions about:  Alcohol, tobacco, and drug use.  Emotional well-being.  Home and relationship well-being.  Sexual activity.  Eating habits.  History of falls.  Memory and ability to understand (cognition).  Work and work Statistician. What immunizations do I need?  Influenza (flu) vaccine  This is recommended every year. Tetanus, diphtheria, and pertussis (Tdap) vaccine  You may need a Td booster every 10 years. Varicella (chickenpox) vaccine  You may need this vaccine if you have not already been vaccinated. Zoster (shingles) vaccine  You may need this after age 46. Pneumococcal conjugate (PCV13) vaccine  One dose is recommended after age 41. Pneumococcal polysaccharide (PPSV23) vaccine  One dose is recommended after age 75. Measles, mumps, and rubella (MMR)  vaccine  You may need at least one dose of MMR if you were born in 1957 or later. You may also need a second dose. Meningococcal conjugate (MenACWY) vaccine  You may need this if you have certain conditions. Hepatitis A vaccine  You may need this if you have certain conditions or if you travel or work in places where you may be exposed to hepatitis A. Hepatitis B vaccine  You may need this if you have certain conditions or if you travel or work in places where you may be exposed to hepatitis B. Haemophilus influenzae type b (Hib) vaccine  You may need this if you have certain  conditions. You may receive vaccines as individual doses or as more than one vaccine together in one shot (combination vaccines). Talk with your health care provider about the risks and benefits of combination vaccines. What tests do I need? Blood tests  Lipid and cholesterol levels. These may be checked every 5 years, or more frequently depending on your overall health.  Hepatitis C test.  Hepatitis B test. Screening  Lung cancer screening. You may have this screening every year starting at age 21 if you have a 30-pack-year history of smoking and currently smoke or have quit within the past 15 years.  Colorectal cancer screening. All adults should have this screening starting at age 78 and continuing until age 30. Your health care provider may recommend screening at age 50 if you are at increased risk. You will have tests every 1-10 years, depending on your results and the type of screening test.  Prostate cancer screening. Recommendations will vary depending on your family history and other risks.  Diabetes screening. This is done by checking your blood sugar (glucose) after you have not eaten for a while (fasting). You may have this done every 1-3 years.  Abdominal aortic aneurysm (AAA) screening. You may need this if you are a current or former smoker.  Sexually transmitted disease (STD) testing. Follow  these instructions at home: Eating and drinking  Eat a diet that includes fresh fruits and vegetables, whole grains, lean protein, and low-fat dairy products. Limit your intake of foods with high amounts of sugar, saturated fats, and salt.  Take vitamin and mineral supplements as recommended by your health care provider.  Do not drink alcohol if your health care provider tells you not to drink.  If you drink alcohol: ? Limit how much you have to 0-2 drinks a day. ? Be aware of how much alcohol is in your drink. In the [place] one drink equals one 12 oz bottle of beer (355 mL), one 5 oz glass of wine (148 mL), or one 1 oz glass of hard liquor (44 mL). Lifestyle  Take daily care of your teeth and gums.  Stay active. Exercise for at least 30 minutes on 5 or more days each week.  Do not use any products that contain nicotine or tobacco, such as cigarettes, e-cigarettes, and chewing tobacco. If you need help quitting, ask your health care provider.  If you are sexually active, practice safe sex. Use a condom or other form of protection to prevent STIs (sexually transmitted infections).  Talk with your health care provider about taking a low-dose aspirin or statin. What's next?  Visit your health care provider once a year for a well check visit.  Ask your health care provider how often you should have your eyes and teeth checked.  Stay up to date on all vaccines. This information is not intended to replace advice given to you by your health care provider. Make sure you discuss any questions you have with your health care provider. Document Revised: 07/05/2018 Document Reviewed: 07/05/2018 Elsevier Patient Education  [place].  Prediabetes Prediabetes is the condition of having a blood sugar (blood glucose) level that is higher than it should be, but not high enough for you to be diagnosed with type 2 diabetes. Having prediabetes puts you at risk for developing type 2 diabetes  (type 2 diabetes mellitus). Prediabetes may be called impaired glucose tolerance or impaired fasting glucose. Prediabetes usually does not cause symptoms. Your health care provider can diagnose this condition  with blood tests. You may be tested for prediabetes if you are overweight and if you have at least one other risk factor for prediabetes. What is blood glucose, and how is it measured? Blood glucose refers to the amount of glucose in your bloodstream. Glucose comes from eating foods that contain sugars and starches (carbohydrates), which the body breaks down into glucose. Your blood glucose level may be measured in mg/dL (milligrams per deciliter) or mmol/L (millimoles per liter). Your blood glucose may be checked with one or more of the following blood tests:  A fasting blood glucose (FBG) test. You will not be allowed to eat (you will fast) for 8 hours or longer before a blood sample is taken. ? A normal range for FBG is 70-100 mg/dl (3.9-5.6 mmol/L).  An A1c (hemoglobin A1c) blood test. This test provides information about blood glucose control over the previous 2?61month.  An oral glucose tolerance test (OGTT). This test measures your blood glucose at two times: ? After fasting. This is your baseline level. ? Two hours after you drink a beverage that contains glucose. You may be diagnosed with prediabetes:  If your FBG is 100?125 mg/dL (5.6-6.9 mmol/L).  If your A1c level is 5.7?6.4%.  If your OGTT result is 140?199 mg/dL (7.8-11 mmol/L). These blood tests may be repeated to confirm your diagnosis. How can this condition affect me? The pancreas produces a hormone (insulin) that helps to move glucose from the bloodstream into cells. When cells in the body do not respond properly to insulin that the body makes (insulin resistance), excess glucose builds up in the blood instead of going into cells. As a result, high blood glucose (hyperglycemia) can develop, which can cause many  complications. Hyperglycemia is a symptom of prediabetes. Having high blood glucose for a long time is dangerous. Too much glucose in your blood can damage your nerves and blood vessels. Long-term damage can lead to complications from diabetes, which may include:  Heart disease.  Stroke.  Blindness.  Kidney disease.  Depression.  Poor circulation in the feet and legs, which could lead to surgical removal (amputation) in severe cases. What can increase my risk? Risk factors for prediabetes include:  Having a family member with type 2 diabetes.  Being overweight or obese.  Being older than age 70  Being of [place] African-American, Hispanic/Latino, or Asian/Pacific Islander descent.  Having an inactive (sedentary) lifestyle.  Having a history of heart disease.  History of gestational diabetes or polycystic ovary syndrome (PCOS), in women.  Having low levels of good cholesterol (HDL-C) or high levels of blood fats (triglycerides).  Having high blood pressure. What actions can I take to prevent diabetes?      Be physically active. ? Do moderate-intensity physical activity for 30 or more minutes on 5 or more days of the week, or as much as told by your health care provider. This could be brisk walking, biking, or water aerobics. ? Ask your health care provider what activities are safe for you. A mix of physical activities may be best, such as walking, swimming, cycling, and strength training.  Lose weight as told by your health care provider. ? Losing 5-7% of your body weight can reverse insulin resistance. ? Your health care provider can determine how much weight loss is best for you and can help you lose weight safely.  Follow a healthy meal plan. This includes eating lean proteins, complex carbohydrates, fresh fruits and vegetables, low-fat dairy products, and healthy fats. ?  Follow instructions from your health care provider about eating or drinking  restrictions. ? Make an appointment to see a diet and nutrition specialist (registered dietitian) to help you create a healthy eating plan that is right for you.  Do not smoke or use any tobacco products, such as cigarettes, chewing tobacco, and e-cigarettes. If you need help quitting, ask your health care provider.  Take over-the-counter and prescription medicines as told by your health care provider. You may be prescribed medicines that help lower the risk of type 2 diabetes.  Keep all follow-up visits as told by your health care provider. This is important. Summary  Prediabetes is the condition of having a blood sugar (blood glucose) level that is higher than it should be, but not high enough for you to be diagnosed with type 2 diabetes.  Having prediabetes puts you at risk for developing type 2 diabetes (type 2 diabetes mellitus).  To help prevent type 2 diabetes, make lifestyle changes such as being physically active and eating a healthy diet. Lose weight as told by your health care provider. This information is not intended to replace advice given to you by your health care provider. Make sure you discuss any questions you have with your health care provider. Document Revised: 11/02/2018 Document Reviewed: 09/01/2015 Elsevier Patient Education  [employer].    If you have lab work done today you will be contacted with your lab results within the next 2 weeks.  If you have not heard from [place] then please contact us. The fastest way to get your results is to register for My Chart.   IF you received an x-ray today, you will receive an invoice from [facility]. Please contact [facility] at [phone] with questions or concerns regarding your invoice.   IF you received labwork today, you will receive an invoice from [place]. Please contact [facility] at [phone] with questions or concerns regarding your invoice.   Our billing staff will not be able to assist  you with questions regarding bills from these companies.  You will be contacted with the lab results as soon as they are available. The fastest way to get your results is to activate your My Chart account. Instructions are located on the last page of this paperwork. If you have not heard from [place] regarding the results in 2 weeks, please contact this office.         Signed, Merri Ray, MD [facility]

## 2019-08-29 NOTE — Patient Instructions (Addendum)
For your neck pain - you are already on a muscle relaxer from Dr. Greta Doom. Continue warm and cool compresses if those are helping.  If not continuing to improve in next few days, you can fill prescription for prednisone.   Restart saline nasal spray and nasonex. I also wrote for antibiotic azithromycin to fill if discoloration not improving or increased sinus pressure/pain. Return to the clinic or go to the nearest emergency room if any of your symptoms worsen or new symptoms occur.  You have prediabetes - cut back on sugar containing beverages. See other info below.   Blood pressure slightly elevated today but that can be related to pain at times.  Monitor your blood pressures at home and if those are over 140/90, schedule visit with me in the next week or 2 to adjust your medications.  Recheck in 3 months regardless.  Return to the clinic or go to the nearest emergency room if any of your symptoms worsen or new symptoms occur.   Preventive Care 22 Years and Older, Male Preventive care refers to lifestyle choices and visits with your health care provider that can promote health and wellness. This includes:  A yearly physical exam. This is also called an annual well check.  Regular dental and eye exams.  Immunizations.  Screening for certain conditions.  Healthy lifestyle choices, such as diet and exercise. What can I expect for my preventive care visit? Physical exam Your health care provider will check:  Height and weight. These may be used to calculate body mass index (BMI), which is a measurement that tells if you are at a healthy weight.  Heart rate and blood pressure.  Your skin for abnormal spots. Counseling Your health care provider may ask you questions about:  Alcohol, tobacco, and drug use.  Emotional well-being.  Home and relationship well-being.  Sexual activity.  Eating habits.  History of falls.  Memory and ability to understand (cognition).  Work and  work Statistician. What immunizations do I need?  Influenza (flu) vaccine  This is recommended every year. Tetanus, diphtheria, and pertussis (Tdap) vaccine  You may need a Td booster every 10 years. Varicella (chickenpox) vaccine  You may need this vaccine if you have not already been vaccinated. Zoster (shingles) vaccine  You may need this after age 52. Pneumococcal conjugate (PCV13) vaccine  One dose is recommended after age 4. Pneumococcal polysaccharide (PPSV23) vaccine  One dose is recommended after age 97. Measles, mumps, and rubella (MMR) vaccine  You may need at least one dose of MMR if you were born in 1957 or later. You may also need a second dose. Meningococcal conjugate (MenACWY) vaccine  You may need this if you have certain conditions. Hepatitis A vaccine  You may need this if you have certain conditions or if you travel or work in places where you may be exposed to hepatitis A. Hepatitis B vaccine  You may need this if you have certain conditions or if you travel or work in places where you may be exposed to hepatitis B. Haemophilus influenzae type b (Hib) vaccine  You may need this if you have certain conditions. You may receive vaccines as individual doses or as more than one vaccine together in one shot (combination vaccines). Talk with your health care provider about the risks and benefits of combination vaccines. What tests do I need? Blood tests  Lipid and cholesterol levels. These may be checked every 5 years, or more frequently depending on your overall health.  Hepatitis C test.  Hepatitis B test. Screening  Lung cancer screening. You may have this screening every year starting at age 65 if you have a 30-pack-year history of smoking and currently smoke or have quit within the past 15 years.  Colorectal cancer screening. All adults should have this screening starting at age 62 and continuing until age 57. Your health care provider may recommend  screening at age 38 if you are at increased risk. You will have tests every 1-10 years, depending on your results and the type of screening test.  Prostate cancer screening. Recommendations will vary depending on your family history and other risks.  Diabetes screening. This is done by checking your blood sugar (glucose) after you have not eaten for a while (fasting). You may have this done every 1-3 years.  Abdominal aortic aneurysm (AAA) screening. You may need this if you are a current or former smoker.  Sexually transmitted disease (STD) testing. Follow these instructions at home: Eating and drinking  Eat a diet that includes fresh fruits and vegetables, whole grains, lean protein, and low-fat dairy products. Limit your intake of foods with high amounts of sugar, saturated fats, and salt.  Take vitamin and mineral supplements as recommended by your health care provider.  Do not drink alcohol if your health care provider tells you not to drink.  If you drink alcohol: ? Limit how much you have to 0-2 drinks a day. ? Be aware of how much alcohol is in your drink. In the [place] one drink equals one 12 oz bottle of beer (355 mL), one 5 oz glass of wine (148 mL), or one 1 oz glass of hard liquor (44 mL). Lifestyle  Take daily care of your teeth and gums.  Stay active. Exercise for at least 30 minutes on 5 or more days each week.  Do not use any products that contain nicotine or tobacco, such as cigarettes, e-cigarettes, and chewing tobacco. If you need help quitting, ask your health care provider.  If you are sexually active, practice safe sex. Use a condom or other form of protection to prevent STIs (sexually transmitted infections).  Talk with your health care provider about taking a low-dose aspirin or statin. What's next?  Visit your health care provider once a year for a well check visit.  Ask your health care provider how often you should have your eyes and teeth  checked.  Stay up to date on all vaccines. This information is not intended to replace advice given to you by your health care provider. Make sure you discuss any questions you have with your health care provider. Document Revised: 07/05/2018 Document Reviewed: 07/05/2018 Elsevier Patient Education  [place].  Prediabetes Prediabetes is the condition of having a blood sugar (blood glucose) level that is higher than it should be, but not high enough for you to be diagnosed with type 2 diabetes. Having prediabetes puts you at risk for developing type 2 diabetes (type 2 diabetes mellitus). Prediabetes may be called impaired glucose tolerance or impaired fasting glucose. Prediabetes usually does not cause symptoms. Your health care provider can diagnose this condition with blood tests. You may be tested for prediabetes if you are overweight and if you have at least one other risk factor for prediabetes. What is blood glucose, and how is it measured? Blood glucose refers to the amount of glucose in your bloodstream. Glucose comes from eating foods that contain sugars and starches (carbohydrates), which the body breaks down into  glucose. Your blood glucose level may be measured in mg/dL (milligrams per deciliter) or mmol/L (millimoles per liter). Your blood glucose may be checked with one or more of the following blood tests:  A fasting blood glucose (FBG) test. You will not be allowed to eat (you will fast) for 8 hours or longer before a blood sample is taken. ? A normal range for FBG is 70-100 mg/dl (3.9-5.6 mmol/L).  An A1c (hemoglobin A1c) blood test. This test provides information about blood glucose control over the previous 2?58month.  An oral glucose tolerance test (OGTT). This test measures your blood glucose at two times: ? After fasting. This is your baseline level. ? Two hours after you drink a beverage that contains glucose. You may be diagnosed with prediabetes:  If your FBG  is 100?125 mg/dL (5.6-6.9 mmol/L).  If your A1c level is 5.7?6.4%.  If your OGTT result is 140?199 mg/dL (7.8-11 mmol/L). These blood tests may be repeated to confirm your diagnosis. How can this condition affect me? The pancreas produces a hormone (insulin) that helps to move glucose from the bloodstream into cells. When cells in the body do not respond properly to insulin that the body makes (insulin resistance), excess glucose builds up in the blood instead of going into cells. As a result, high blood glucose (hyperglycemia) can develop, which can cause many complications. Hyperglycemia is a symptom of prediabetes. Having high blood glucose for a long time is dangerous. Too much glucose in your blood can damage your nerves and blood vessels. Long-term damage can lead to complications from diabetes, which may include:  Heart disease.  Stroke.  Blindness.  Kidney disease.  Depression.  Poor circulation in the feet and legs, which could lead to surgical removal (amputation) in severe cases. What can increase my risk? Risk factors for prediabetes include:  Having a family member with type 2 diabetes.  Being overweight or obese.  Being older than age 70  Being of American [place] African-American, Hispanic/Latino, or Asian/Pacific Islander descent.  Having an inactive (sedentary) lifestyle.  Having a history of heart disease.  History of gestational diabetes or polycystic ovary syndrome (PCOS), in women.  Having low levels of good cholesterol (HDL-C) or high levels of blood fats (triglycerides).  Having high blood pressure. What actions can I take to prevent diabetes?      Be physically active. ? Do moderate-intensity physical activity for 30 or more minutes on 5 or more days of the week, or as much as told by your health care provider. This could be brisk walking, biking, or water aerobics. ? Ask your health care provider what activities are safe for you. A mix of  physical activities may be best, such as walking, swimming, cycling, and strength training.  Lose weight as told by your health care provider. ? Losing 5-7% of your body weight can reverse insulin resistance. ? Your health care provider can determine how much weight loss is best for you and can help you lose weight safely.  Follow a healthy meal plan. This includes eating lean proteins, complex carbohydrates, fresh fruits and vegetables, low-fat dairy products, and healthy fats. ? Follow instructions from your health care provider about eating or drinking restrictions. ? Make an appointment to see a diet and nutrition specialist (registered dietitian) to help you create a healthy eating plan that is right for you.  Do not smoke or use any tobacco products, such as cigarettes, chewing tobacco, and e-cigarettes. If you need help quitting, ask your  health care provider.  Take over-the-counter and prescription medicines as told by your health care provider. You may be prescribed medicines that help lower the risk of type 2 diabetes.  Keep all follow-up visits as told by your health care provider. This is important. Summary  Prediabetes is the condition of having a blood sugar (blood glucose) level that is higher than it should be, but not high enough for you to be diagnosed with type 2 diabetes.  Having prediabetes puts you at risk for developing type 2 diabetes (type 2 diabetes mellitus).  To help prevent type 2 diabetes, make lifestyle changes such as being physically active and eating a healthy diet. Lose weight as told by your health care provider. This information is not intended to replace advice given to you by your health care provider. Make sure you discuss any questions you have with your health care provider. Document Revised: 11/02/2018 Document Reviewed: 09/01/2015 Elsevier Patient Education  [employer].    If you have lab work done today you will be contacted with your  lab results within the next 2 weeks.  If you have not heard from [place] then please contact us. The fastest way to get your results is to register for My Chart.   IF you received an x-ray today, you will receive an invoice from [facility]. Please contact [facility] at [phone] with questions or concerns regarding your invoice.   IF you received labwork today, you will receive an invoice from [place]. Please contact [facility] at [phone] with questions or concerns regarding your invoice.   Our billing staff will not be able to assist you with questions regarding bills from these companies.  You will be contacted with the lab results as soon as they are available. The fastest way to get your results is to activate your My Chart account. Instructions are located on the last page of this paperwork. If you have not heard from [place] regarding the results in 2 weeks, please contact this office.

## 2019-08-30 ENCOUNTER — Encounter: Payer: Self-pay | Admitting: Family Medicine

## 2019-08-30 LAB — LIPID PANEL
Chol/HDL Ratio: 4.8 ratio (ref 0.0–5.0)
Cholesterol, Total: 157 mg/dL (ref 100–199)
HDL: 33 mg/dL — ABNORMAL LOW (ref >39)
LDL Chol Calc (NIH): 88 mg/dL (ref 0–99)
Triglycerides: 210 mg/dL — ABNORMAL HIGH (ref 0–149)
VLDL Cholesterol Cal: 36 mg/dL (ref 5–40)

## 2019-08-30 LAB — COMPREHENSIVE METABOLIC PANEL
ALT: 19 IU/L (ref 0–44)
AST: 28 IU/L (ref 0–40)
Albumin/Globulin Ratio: 1.9 (ref 1.2–2.2)
Albumin: 5 g/dL — ABNORMAL HIGH (ref 3.8–4.8)
Alkaline Phosphatase: 82 IU/L (ref 39–117)
BUN/Creatinine Ratio: 12 (ref 10–24)
BUN: 16 mg/dL (ref 8–27)
Bilirubin Total: 0.4 mg/dL (ref 0.0–1.2)
CO2: 19 mmol/L — ABNORMAL LOW (ref 20–29)
Calcium: 9.6 mg/dL (ref 8.6–10.2)
Chloride: 102 mmol/L (ref 96–106)
Creatinine, Ser: 1.31 mg/dL — ABNORMAL HIGH (ref 0.76–1.27)
GFR calc Af Amer: 64 mL/min/{1.73_m2} (ref >59)
GFR calc non Af Amer: 55 mL/min/{1.73_m2} — ABNORMAL LOW (ref >59)
Globulin, Total: 2.6 g/dL (ref 1.5–4.5)
Glucose: 117 mg/dL — ABNORMAL HIGH (ref 65–99)
Potassium: 4.7 mmol/L (ref 3.5–5.2)
Sodium: 140 mmol/L (ref 134–144)
Total Protein: 7.6 g/dL (ref 6.0–8.5)

## 2019-08-30 LAB — HEMOGLOBIN A1C
Est. average glucose Bld gHb Est-mCnc: 123 mg/dL
Hgb A1c MFr Bld: 5.9 % — ABNORMAL HIGH (ref 4.8–5.6)

## 2019-09-04 DIAGNOSIS — H43393 Other vitreous opacities, bilateral: Secondary | ICD-10-CM | POA: Diagnosis not present

## 2019-09-04 DIAGNOSIS — H02834 Dermatochalasis of left upper eyelid: Secondary | ICD-10-CM | POA: Diagnosis not present

## 2019-09-04 DIAGNOSIS — H35033 Hypertensive retinopathy, bilateral: Secondary | ICD-10-CM | POA: Diagnosis not present

## 2019-09-04 DIAGNOSIS — H02831 Dermatochalasis of right upper eyelid: Secondary | ICD-10-CM | POA: Diagnosis not present

## 2019-09-10 ENCOUNTER — Encounter: Payer: Self-pay | Admitting: Radiology

## 2019-10-03 DIAGNOSIS — M47817 Spondylosis without myelopathy or radiculopathy, lumbosacral region: Secondary | ICD-10-CM | POA: Diagnosis not present

## 2019-10-03 DIAGNOSIS — G894 Chronic pain syndrome: Secondary | ICD-10-CM | POA: Diagnosis not present

## 2019-10-03 DIAGNOSIS — M461 Sacroiliitis, not elsewhere classified: Secondary | ICD-10-CM | POA: Diagnosis not present

## 2019-10-03 DIAGNOSIS — M5137 Other intervertebral disc degeneration, lumbosacral region: Secondary | ICD-10-CM | POA: Diagnosis not present

## 2019-11-11 DIAGNOSIS — H02834 Dermatochalasis of left upper eyelid: Secondary | ICD-10-CM | POA: Diagnosis not present

## 2019-11-11 DIAGNOSIS — H02831 Dermatochalasis of right upper eyelid: Secondary | ICD-10-CM | POA: Diagnosis not present

## 2019-11-11 DIAGNOSIS — H57813 Brow ptosis, bilateral: Secondary | ICD-10-CM | POA: Diagnosis not present

## 2019-11-11 DIAGNOSIS — H02413 Mechanical ptosis of bilateral eyelids: Secondary | ICD-10-CM | POA: Diagnosis not present

## 2019-11-11 DIAGNOSIS — H02423 Myogenic ptosis of bilateral eyelids: Secondary | ICD-10-CM | POA: Diagnosis not present

## 2019-11-18 DIAGNOSIS — M47817 Spondylosis without myelopathy or radiculopathy, lumbosacral region: Secondary | ICD-10-CM | POA: Diagnosis not present

## 2019-11-18 DIAGNOSIS — M461 Sacroiliitis, not elsewhere classified: Secondary | ICD-10-CM | POA: Diagnosis not present

## 2019-11-18 DIAGNOSIS — M5137 Other intervertebral disc degeneration, lumbosacral region: Secondary | ICD-10-CM | POA: Diagnosis not present

## 2019-11-18 DIAGNOSIS — G894 Chronic pain syndrome: Secondary | ICD-10-CM | POA: Diagnosis not present

## 2019-12-03 DIAGNOSIS — H53482 Generalized contraction of visual field, left eye: Secondary | ICD-10-CM | POA: Diagnosis not present

## 2019-12-03 DIAGNOSIS — H53483 Generalized contraction of visual field, bilateral: Secondary | ICD-10-CM | POA: Diagnosis not present

## 2019-12-03 DIAGNOSIS — H53481 Generalized contraction of visual field, right eye: Secondary | ICD-10-CM | POA: Diagnosis not present

## 2019-12-04 ENCOUNTER — Ambulatory Visit: Payer: PRIVATE HEALTH INSURANCE | Admitting: Family Medicine

## 2019-12-06 ENCOUNTER — Other Ambulatory Visit: Payer: Self-pay

## 2019-12-06 ENCOUNTER — Ambulatory Visit (INDEPENDENT_AMBULATORY_CARE_PROVIDER_SITE_OTHER): Payer: Medicare Other | Admitting: Family Medicine

## 2019-12-06 VITALS — BP 154/75 | HR 65 | Temp 98.0°F | Resp 15 | Ht 68.0 in | Wt 214.4 lb

## 2019-12-06 DIAGNOSIS — R7303 Prediabetes: Secondary | ICD-10-CM

## 2019-12-06 DIAGNOSIS — I10 Essential (primary) hypertension: Secondary | ICD-10-CM

## 2019-12-06 DIAGNOSIS — E785 Hyperlipidemia, unspecified: Secondary | ICD-10-CM

## 2019-12-06 LAB — POCT GLYCOSYLATED HEMOGLOBIN (HGB A1C): Hemoglobin A1C: 5.9 % — AB (ref 4.0–5.6)

## 2019-12-06 NOTE — Patient Instructions (Addendum)
  Write down home blood pressure readings over next week and bring me a copy to decide on med changes.   Small changes in sugar containing beverages may help prediabetes, as well as getting back to walking. Recheck labs in 6 months.   If you have lab work done today you will be contacted with your lab results within the next 2 weeks.  If you have not heard from [place] then please contact us. The fastest way to get your results is to register for My Chart.   IF you received an x-ray today, you will receive an invoice from [facility]. Please contact [facility] at [phone] with questions or concerns regarding your invoice.   IF you received labwork today, you will receive an invoice from [place]. Please contact [facility] at [phone] with questions or concerns regarding your invoice.   Our billing staff will not be able to assist you with questions regarding bills from these companies.  You will be contacted with the lab results as soon as they are available. The fastest way to get your results is to activate your My Chart account. Instructions are located on the last page of this paperwork. If you have not heard from [place] regarding the results in 2 weeks, please contact this office.

## 2019-12-06 NOTE — Progress Notes (Signed)
Subjective:  Patient ID: Matthew [name], male    DOB: Dec 22, 1950  Age: 69 y.o. MRN: [id number]  CC:  Chief Complaint  Patient presents with  . Hypertension    pt following up on prediabetes, pt states his BP will not get any better "its been this way for years" pt denies physical symptoms.     HPI Matthew [name] presents for   Hypertension: Currently on amlodipine 10 mg daily, atenolol 25 mg daily.  Home readings: usually around 150's.  ? 130's? No record of readings.  No new side effects on meds.  BP Readings from Last 3 Encounters:  12/06/19 (!) 154/75  08/29/19 (!) 160/72  06/19/19 (!) 131/59   Lab Results  Component Value Date   CREATININE 1.31 (H) 08/29/2019   Prediabetes: Still drinking pepsi and tea, ice cream at night. Plans to continue same. Aware of prediabetes and risk of progression to diabetes.  Less exercise, prior walking daily.   Lab Results  Component Value Date   HGBA1C 5.9 (A) 12/06/2019    History Patient Active Problem List   Diagnosis Date Noted  . PAD (peripheral artery disease) ([place]) 06/19/2019  . Chest pain 05/22/2019  . Orthopnea 05/22/2019  . Screening for AAA (abdominal aortic aneurysm) 05/22/2019  . Tobacco abuse 05/22/2019  . Impingement syndrome of right shoulder region 01/18/2018  . Hypertension 07/10/2017  . Pain of both shoulder joints 05/03/2017  . Sleep disturbance 11/15/2012  . Snoring 11/15/2012  . Chronic back pain 11/15/2012  . Cancer ([place]) 02/11/2005   Past Medical History:  Diagnosis Date  . Arthritis   . Cancer ([place]) 02/11/2005   prostate cancer, gleason 3+3=6, vol 30 cc  . Chronic back pain 11/15/2012  . GERD (gastroesophageal reflux disease)   . Gout   . History of shingles   . Hx of radiation therapy 08/04/05 to 09/22/2005   prostate bed  . Hypercholesteremia   . Hypertension   . Sleep disturbance 11/15/2012  . Snoring 11/15/2012   Past Surgical History:  Procedure Laterality Date  . COLONOSCOPY    . EYE SURGERY     . HERNIA REPAIR  05/23/12   RIH  . NECK SURGERY    . PROSTATECTOMY  03/2005   Gleason 3+4=7  . UPPER GASTROINTESTINAL ENDOSCOPY     No Known Allergies Prior to Admission medications   Medication Sig Start Date End Date Taking? Authorizing Provider  amitriptyline (ELAVIL) 25 MG tablet Take 25 mg by mouth at bedtime.   Yes [provider]  amLODipine (NORVASC) 10 MG tablet Take 1 tablet (10 mg total) by mouth daily. 02/21/19  Yes Wendie Agreste, MD  atenolol (TENORMIN) 25 MG tablet Take 1 tablet (25 mg total) by mouth daily. 02/21/19  Yes Wendie Agreste, MD  atorvastatin (LIPITOR) 40 MG tablet Take 1 tablet (40 mg total) by mouth daily. 02/21/19  Yes Wendie Agreste, MD  finasteride (PROSCAR) 5 MG tablet Take 5 mg by mouth daily.   Yes [provider]  oxyCODONE-acetaminophen (PERCOCET) 10-325 MG tablet Take 1 tablet by mouth every 6 (six) hours as needed.  10/12/17  Yes [provider]  pantoprazole (PROTONIX) 40 MG tablet Take 40 mg by mouth daily.   Yes [provider]  bicalutamide (CASODEX) 50 MG tablet Take 50 mg by mouth daily.    [provider]  Cyclobenzaprine-Gabapentin 10 & 300 MG THPK SMARTSIG:1 Tablet(s) By Mouth 3 Times Daily PRN 06/13/19   [provider]  fesoterodine (  TOVIAZ) 8 MG TB24 tablet Take 8 mg by mouth daily.    [provider]  predniSONE (DELTASONE) 20 MG tablet Take 2 tablets (40 mg total) by mouth daily with breakfast. Patient not taking: Reported on 12/06/2019 08/29/19   Wendie Agreste, MD   Social History   Socioeconomic History  . Marital status: Married    Spouse name: Not on file  . Number of children: 1  . Years of education: Not on file  . Highest education level: Not on file  Occupational History  . Not on file  Tobacco Use  . Smoking status: Never Smoker  . Smokeless tobacco: Current User    Types: Chew, Snuff  . Tobacco comment: 50 + yrs started chewing tobacco at 69 yrs old    Substance and Sexual Activity  . Alcohol use: No  . Drug use: No  . Sexual activity: Not on file  Other Topics Concern  . Not on file  Social History Narrative   Married, 1 son, pipe fitter   Social Determinants of Health   Financial Resource Strain:   . Difficulty of Paying Living Expenses:   Food Insecurity:   . Worried About Charity fundraiser in the Last Year:   . Arboriculturist in the Last Year:   Transportation Needs:   . Film/video editor (Medical):   Marland Kitchen Lack of Transportation (Non-Medical):   Physical Activity:   . Days of Exercise per Week:   . Minutes of Exercise per Session:   Stress:   . Feeling of Stress :   Social Connections:   . Frequency of Communication with Friends and Family:   . Frequency of Social Gatherings with Friends and Family:   . Attends Religious Services:   . Active Member of Clubs or Organizations:   . Attends Archivist Meetings:   Marland Kitchen Marital Status:   Intimate Partner Violence:   . Fear of Current or Ex-Partner:   . Emotionally Abused:   Marland Kitchen Physically Abused:   . Sexually Abused:     Review of Systems Per hpi  Objective:   Vitals:   12/06/19 1622 12/06/19 1630  BP: (!) 157/73 (!) 154/75  Pulse: 65   Resp: 15   Temp: 98 F (36.7 C)   TempSrc: Temporal   SpO2: 98%   Weight: 214 lb 6.4 oz (97.3 kg)   Height: 5\' 8"  (1.727 m)      Physical Exam Vitals reviewed.  Constitutional:      Appearance: He is well-developed.  HENT:     Head: Normocephalic and atraumatic.  Eyes:     Pupils: Pupils are equal, round, and reactive to light.  Neck:     Vascular: No carotid bruit or JVD.  Cardiovascular:     Rate and Rhythm: Normal rate and regular rhythm.     Heart sounds: Normal heart sounds. No murmur.  Pulmonary:     Effort: Pulmonary effort is normal.     Breath sounds: Normal breath sounds. No rales.  Skin:    General: Skin is warm and dry.  Neurological:     Mental Status: He is alert and oriented to  person, place, and time.        Assessment & Plan:  Gwynne Heffernan is a 69 y.o. male . Prediabetes - Plan: POCT glycosylated hemoglobin (Hb A1C)  -Overall stable.  Dietary guidance provided with trying to minimize some of his sugar-containing beverages, desserts, increase walking, recheck levels in  6 months.  Essential hypertension  -Questionable control at home, possible some variation in home readings, but does report some 150s.  Potentially may need higher dosing of atenolol with precautions for bradycardia.  We will have him check home readings over the next week and review those to decide on changes.  Continue same regimen for now.  Hyperlipidemia, unspecified hyperlipidemia type - Plan: Lipid panel  Still some hypertriglyceridemia, diet changes as above could help.  No other med changes for now No orders of the defined types were placed in this encounter.  Patient Instructions    Write down home blood pressure readings over next week and bring me a copy to decide on med changes.   Small changes in sugar containing beverages may help prediabetes, as well as getting back to walking. Recheck labs in 6 months.   If you have lab work done today you will be contacted with your lab results within the next 2 weeks.  If you have not heard from [place] then please contact us. The fastest way to get your results is to register for My Chart.   IF you received an x-ray today, you will receive an invoice from [facility]. Please contact [facility] Radiology at [phone] with questions or concerns regarding your invoice.   IF you received labwork today, you will receive an invoice from [place]. Please contact [facility] at [phone] with questions or concerns regarding your invoice.   Our billing staff will not be able to assist you with questions regarding bills from these companies.  You will be contacted with the lab results as soon as they are available. The fastest way to get  your results is to activate your My Chart account. Instructions are located on the last page of this paperwork. If you have not heard from [place] regarding the results in 2 weeks, please contact this office.         Signed, Merri Ray, MD [facility]

## 2019-12-07 ENCOUNTER — Encounter: Payer: Self-pay | Admitting: Family Medicine

## 2019-12-07 LAB — LIPID PANEL
Chol/HDL Ratio: 4.8 ratio (ref 0.0–5.0)
Cholesterol, Total: 148 mg/dL (ref 100–199)
HDL: 31 mg/dL — ABNORMAL LOW (ref >39)
LDL Chol Calc (NIH): 80 mg/dL (ref 0–99)
Triglycerides: 222 mg/dL — ABNORMAL HIGH (ref 0–149)
VLDL Cholesterol Cal: 37 mg/dL (ref 5–40)

## 2019-12-16 ENCOUNTER — Ambulatory Visit: Payer: No Typology Code available for payment source | Admitting: Cardiology

## 2019-12-16 DIAGNOSIS — I10 Essential (primary) hypertension: Secondary | ICD-10-CM | POA: Diagnosis not present

## 2019-12-16 DIAGNOSIS — E785 Hyperlipidemia, unspecified: Secondary | ICD-10-CM | POA: Diagnosis not present

## 2020-01-13 DIAGNOSIS — M47817 Spondylosis without myelopathy or radiculopathy, lumbosacral region: Secondary | ICD-10-CM | POA: Diagnosis not present

## 2020-01-13 DIAGNOSIS — G894 Chronic pain syndrome: Secondary | ICD-10-CM | POA: Diagnosis not present

## 2020-01-13 DIAGNOSIS — M5137 Other intervertebral disc degeneration, lumbosacral region: Secondary | ICD-10-CM | POA: Diagnosis not present

## 2020-01-13 DIAGNOSIS — M461 Sacroiliitis, not elsewhere classified: Secondary | ICD-10-CM | POA: Diagnosis not present

## 2020-02-12 ENCOUNTER — Other Ambulatory Visit (INDEPENDENT_AMBULATORY_CARE_PROVIDER_SITE_OTHER): Payer: Self-pay

## 2020-02-12 MED ORDER — AZITHROMYCIN 1 G PO PACK: 1.0000 g | PACK | Freq: Once | ORAL | 0 refills | Status: AC

## 2020-03-06 ENCOUNTER — Other Ambulatory Visit: Payer: Self-pay | Admitting: Family Medicine

## 2020-03-06 DIAGNOSIS — I10 Essential (primary) hypertension: Secondary | ICD-10-CM

## 2020-03-06 DIAGNOSIS — E785 Hyperlipidemia, unspecified: Secondary | ICD-10-CM

## 2020-03-16 DIAGNOSIS — G894 Chronic pain syndrome: Secondary | ICD-10-CM | POA: Diagnosis not present

## 2020-03-16 DIAGNOSIS — M47817 Spondylosis without myelopathy or radiculopathy, lumbosacral region: Secondary | ICD-10-CM | POA: Diagnosis not present

## 2020-03-16 DIAGNOSIS — M461 Sacroiliitis, not elsewhere classified: Secondary | ICD-10-CM | POA: Diagnosis not present

## 2020-03-16 DIAGNOSIS — M5137 Other intervertebral disc degeneration, lumbosacral region: Secondary | ICD-10-CM | POA: Diagnosis not present

## 2020-06-05 ENCOUNTER — Ambulatory Visit: Payer: PRIVATE HEALTH INSURANCE | Admitting: Family Medicine

## 2020-06-09 ENCOUNTER — Other Ambulatory Visit: Payer: Self-pay | Admitting: Family Medicine

## 2020-06-09 DIAGNOSIS — I10 Essential (primary) hypertension: Secondary | ICD-10-CM

## 2020-08-31 DIAGNOSIS — M5137 Other intervertebral disc degeneration, lumbosacral region: Secondary | ICD-10-CM | POA: Diagnosis not present

## 2020-08-31 DIAGNOSIS — M47817 Spondylosis without myelopathy or radiculopathy, lumbosacral region: Secondary | ICD-10-CM | POA: Diagnosis not present

## 2020-08-31 DIAGNOSIS — G894 Chronic pain syndrome: Secondary | ICD-10-CM | POA: Diagnosis not present

## 2020-08-31 DIAGNOSIS — I10 Essential (primary) hypertension: Secondary | ICD-10-CM | POA: Diagnosis not present

## 2020-08-31 DIAGNOSIS — M461 Sacroiliitis, not elsewhere classified: Secondary | ICD-10-CM | POA: Diagnosis not present

## 2020-10-06 DIAGNOSIS — Z01812 Encounter for preprocedural laboratory examination: Secondary | ICD-10-CM | POA: Diagnosis not present

## 2020-10-09 DIAGNOSIS — D125 Benign neoplasm of sigmoid colon: Secondary | ICD-10-CM | POA: Diagnosis not present

## 2020-10-09 DIAGNOSIS — D122 Benign neoplasm of ascending colon: Secondary | ICD-10-CM | POA: Diagnosis not present

## 2020-10-09 DIAGNOSIS — K635 Polyp of colon: Secondary | ICD-10-CM | POA: Diagnosis not present

## 2020-10-09 DIAGNOSIS — K5730 Diverticulosis of large intestine without perforation or abscess without bleeding: Secondary | ICD-10-CM | POA: Diagnosis not present

## 2020-10-09 DIAGNOSIS — D123 Benign neoplasm of transverse colon: Secondary | ICD-10-CM | POA: Diagnosis not present

## 2020-10-09 DIAGNOSIS — Z86010 Personal history of colonic polyps: Secondary | ICD-10-CM | POA: Diagnosis not present

## 2020-10-09 DIAGNOSIS — K648 Other hemorrhoids: Secondary | ICD-10-CM | POA: Diagnosis not present

## 2020-10-13 DIAGNOSIS — D125 Benign neoplasm of sigmoid colon: Secondary | ICD-10-CM | POA: Diagnosis not present

## 2020-10-13 DIAGNOSIS — D123 Benign neoplasm of transverse colon: Secondary | ICD-10-CM | POA: Diagnosis not present

## 2020-10-13 DIAGNOSIS — D122 Benign neoplasm of ascending colon: Secondary | ICD-10-CM | POA: Diagnosis not present

## 2020-10-26 DIAGNOSIS — G894 Chronic pain syndrome: Secondary | ICD-10-CM | POA: Diagnosis not present

## 2020-10-26 DIAGNOSIS — M461 Sacroiliitis, not elsewhere classified: Secondary | ICD-10-CM | POA: Diagnosis not present

## 2020-10-26 DIAGNOSIS — M5137 Other intervertebral disc degeneration, lumbosacral region: Secondary | ICD-10-CM | POA: Diagnosis not present

## 2020-10-26 DIAGNOSIS — M47817 Spondylosis without myelopathy or radiculopathy, lumbosacral region: Secondary | ICD-10-CM | POA: Diagnosis not present

## 2020-11-13 DIAGNOSIS — H35033 Hypertensive retinopathy, bilateral: Secondary | ICD-10-CM | POA: Diagnosis not present

## 2020-11-13 DIAGNOSIS — H02834 Dermatochalasis of left upper eyelid: Secondary | ICD-10-CM | POA: Diagnosis not present

## 2020-11-13 DIAGNOSIS — H02831 Dermatochalasis of right upper eyelid: Secondary | ICD-10-CM | POA: Diagnosis not present

## 2020-11-13 DIAGNOSIS — H43393 Other vitreous opacities, bilateral: Secondary | ICD-10-CM | POA: Diagnosis not present

## 2020-12-02 DIAGNOSIS — I10 Essential (primary) hypertension: Secondary | ICD-10-CM | POA: Diagnosis not present

## 2020-12-02 DIAGNOSIS — L989 Disorder of the skin and subcutaneous tissue, unspecified: Secondary | ICD-10-CM | POA: Diagnosis not present

## 2021-01-10 IMAGING — DX DG CHEST 2V
2 series · 2 of 2 positions shown · non-contrast
Comparison: 12/07/2016

CLINICAL DATA: One-week history of dyspnea on exertion.

EXAM:
CHEST - 2 VIEW

[chest pa]
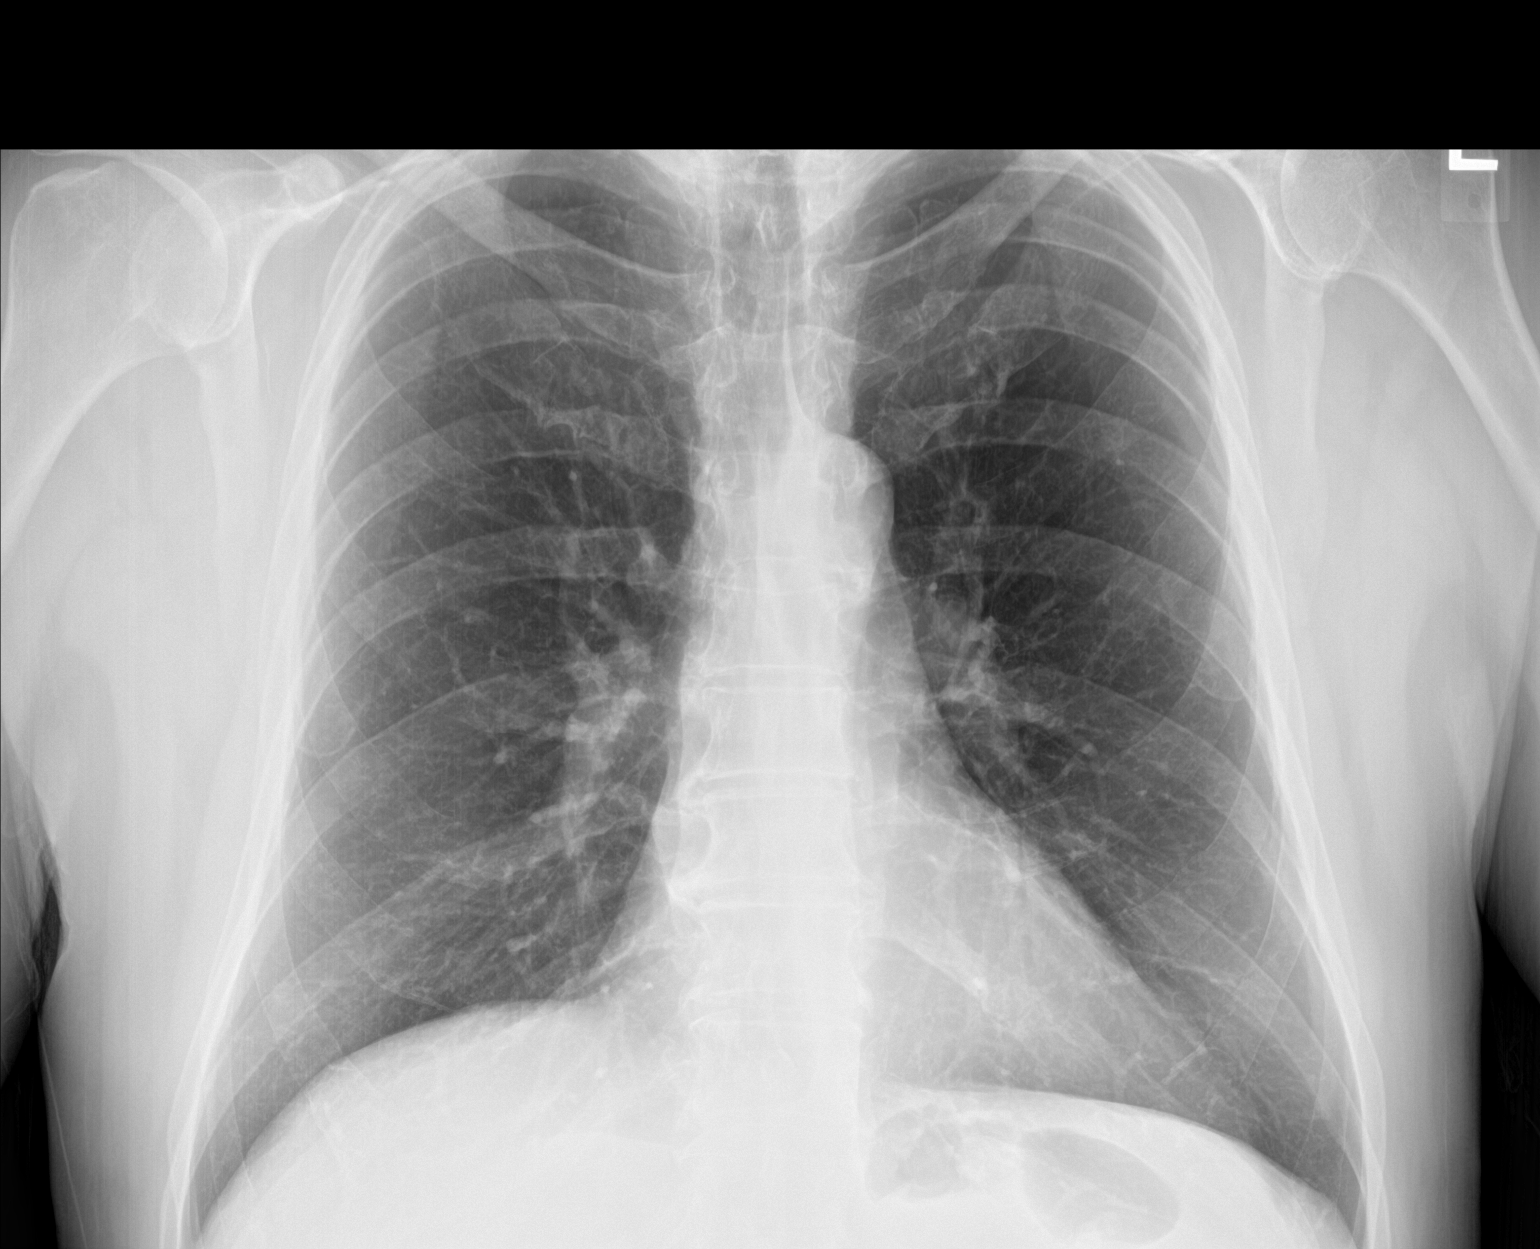

[chest lat]
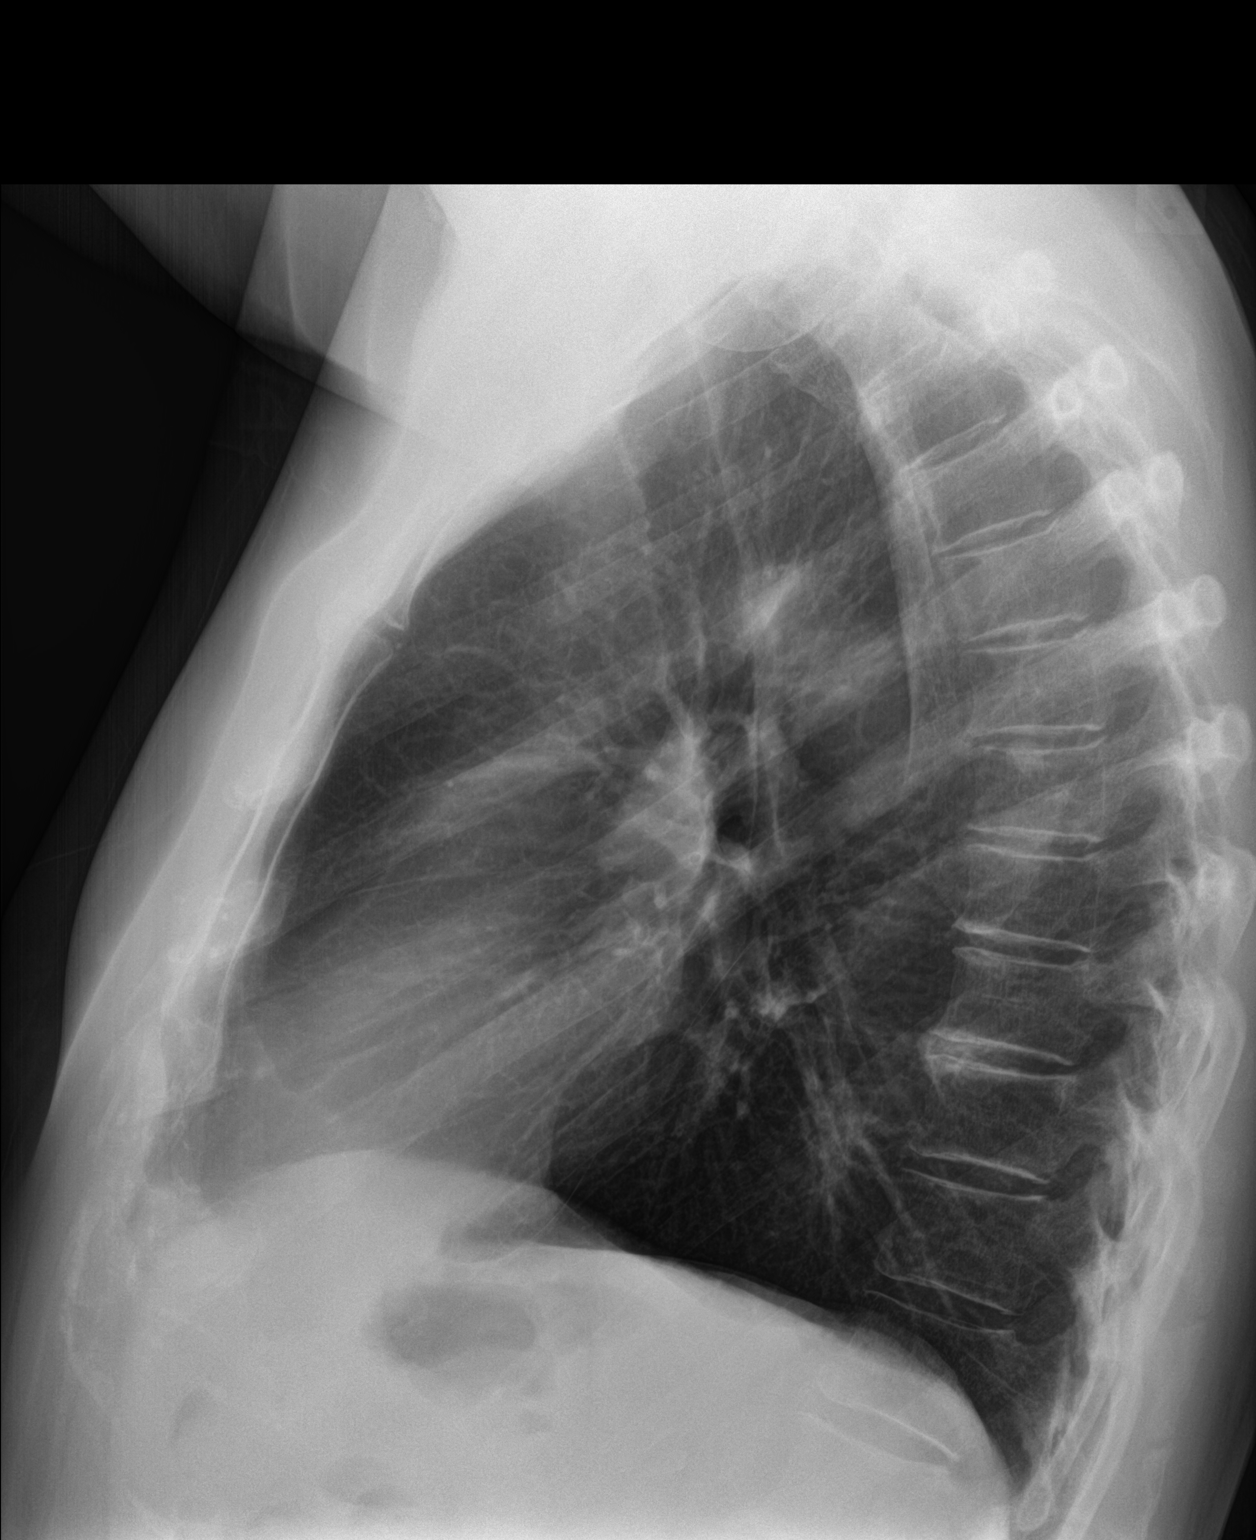

[2 of 2 positions shown; findings below may reference images not displayed]

FINDINGS: The heart size and mediastinal contours are within normal limits.
Both lungs are clear. The visualized skeletal structures are
unremarkable.
IMPRESSION: No active cardiopulmonary disease.

## 2021-02-04 ENCOUNTER — Other Ambulatory Visit: Payer: Self-pay | Admitting: Family Medicine

## 2021-02-04 DIAGNOSIS — E785 Hyperlipidemia, unspecified: Secondary | ICD-10-CM

## 2021-09-20 ENCOUNTER — Other Ambulatory Visit: Payer: Self-pay | Admitting: Physician Assistant

## 2021-09-20 ENCOUNTER — Ambulatory Visit
Admission: RE | Admit: 2021-09-20 | Discharge: 2021-09-20 | Disposition: A | Discharge status: Home / Self Care | Payer: Medicare Other | Source: Ambulatory Visit | Attending: Physician Assistant | Admitting: Physician Assistant

## 2021-09-20 DIAGNOSIS — M546 Pain in thoracic spine: Secondary | ICD-10-CM

## 2021-09-20 DIAGNOSIS — M25512 Pain in left shoulder: Secondary | ICD-10-CM

## 2021-09-20 DIAGNOSIS — R0789 Other chest pain: Secondary | ICD-10-CM

## 2021-09-22 ENCOUNTER — Other Ambulatory Visit: Payer: Self-pay | Admitting: Urology

## 2021-10-25 ENCOUNTER — Other Ambulatory Visit: Payer: Self-pay | Admitting: Physical Medicine and Rehabilitation

## 2021-10-25 DIAGNOSIS — M5412 Radiculopathy, cervical region: Secondary | ICD-10-CM

## 2021-11-11 ENCOUNTER — Encounter (HOSPITAL_BASED_OUTPATIENT_CLINIC_OR_DEPARTMENT_OTHER): Payer: Self-pay | Admitting: Urology

## 2021-11-11 ENCOUNTER — Other Ambulatory Visit: Payer: Self-pay

## 2021-11-11 NOTE — Progress Notes (Signed)
Spoke w/ via phone for pre-op interview--- pt ?Lab needs dos----  [facility] and ekg             ?Lab results------ no ?COVID test -----patient states asymptomatic no test needed ?Arrive at ------- 0800 on 11-15-2021 ?NPO after MN NO Solid Food (per verbalized understanding this includes no chew tobacco) Clear liquids from MN until--- 0700 ?Med rec completed ?Medications to take morning of surgery ----- atenolol, norvasc, oxycodone ?Diabetic medication ----- n/a ?Patient instructed no nail polish to be worn day of surgery ?Patient instructed to bring photo id and insurance card day of surgery ?Patient aware to have Driver (ride ) / caregiver for 24 hours after surgery --- son, Matthew [name] ?Patient Special Instructions ----- n/a ?Pre-Op special Istructions ----- n/a ?Patient verbalized understanding of instructions that were given at this phone interview. ?Patient denies shortness of breath, chest pain, fever, cough at this phone interview.  ?

## 2021-11-15 ENCOUNTER — Encounter (HOSPITAL_BASED_OUTPATIENT_CLINIC_OR_DEPARTMENT_OTHER)
Admission: RE | Disposition: A | Discharge status: Home / Self Care | Payer: Self-pay | Source: Home / Self Care | Attending: Urology

## 2021-11-15 ENCOUNTER — Ambulatory Visit (HOSPITAL_BASED_OUTPATIENT_CLINIC_OR_DEPARTMENT_OTHER): Payer: Medicare Other | Admitting: Anesthesiology

## 2021-11-15 ENCOUNTER — Encounter (HOSPITAL_BASED_OUTPATIENT_CLINIC_OR_DEPARTMENT_OTHER): Payer: Self-pay | Admitting: Urology

## 2021-11-15 ENCOUNTER — Other Ambulatory Visit: Payer: Self-pay

## 2021-11-15 ENCOUNTER — Ambulatory Visit (HOSPITAL_BASED_OUTPATIENT_CLINIC_OR_DEPARTMENT_OTHER)
Admission: RE | Admit: 2021-11-15 | Discharge: 2021-11-15 | Disposition: A | Discharge status: Home / Self Care | Payer: Medicare Other | Attending: Urology | Admitting: Urology

## 2021-11-15 DIAGNOSIS — F17220 Nicotine dependence, chewing tobacco, uncomplicated: Secondary | ICD-10-CM | POA: Diagnosis not present

## 2021-11-15 DIAGNOSIS — N320 Bladder-neck obstruction: Secondary | ICD-10-CM | POA: Diagnosis not present

## 2021-11-15 DIAGNOSIS — N35919 Unspecified urethral stricture, male, unspecified site: Secondary | ICD-10-CM | POA: Diagnosis not present

## 2021-11-15 DIAGNOSIS — Z9079 Acquired absence of other genital organ(s): Secondary | ICD-10-CM | POA: Diagnosis not present

## 2021-11-15 DIAGNOSIS — Z8546 Personal history of malignant neoplasm of prostate: Secondary | ICD-10-CM | POA: Insufficient documentation

## 2021-11-15 HISTORY — DX: Opioid use, unspecified, uncomplicated: F11.90

## 2021-11-15 HISTORY — DX: Prediabetes: R73.03

## 2021-11-15 HISTORY — DX: Unspecified urinary incontinence: R32

## 2021-11-15 HISTORY — DX: Hyperlipidemia, unspecified: E78.5

## 2021-11-15 HISTORY — DX: Presence of dental prosthetic device (complete) (partial): Z97.2

## 2021-11-15 HISTORY — DX: Personal history of adenomatous and serrated colon polyps: Z86.0101

## 2021-11-15 HISTORY — DX: Male erectile dysfunction, unspecified: N52.9

## 2021-11-15 HISTORY — DX: Low back pain, unspecified: M54.50

## 2021-11-15 HISTORY — DX: Other intervertebral disc degeneration, lumbosacral region: M51.37

## 2021-11-15 HISTORY — DX: Unspecified urethral stricture, male, unspecified site: N35.919

## 2021-11-15 HISTORY — DX: Congenital diverticulum of esophagus: Q39.6

## 2021-11-15 HISTORY — DX: Other chronic pain: G89.29

## 2021-11-15 HISTORY — PX: CYSTOSCOPY WITH URETHRAL DILATATION: SHX5125

## 2021-11-15 HISTORY — DX: Other intervertebral disc degeneration, lumbosacral region without mention of lumbar back pain or lower extremity pain: M51.379

## 2021-11-15 HISTORY — DX: Iron deficiency anemia, unspecified: D50.9

## 2021-11-15 HISTORY — DX: Diaphragmatic hernia without obstruction or gangrene: K44.9

## 2021-11-15 HISTORY — DX: Anesthesia of skin: R20.0

## 2021-11-15 HISTORY — DX: Personal history of other diseases of the musculoskeletal system and connective tissue: Z87.39

## 2021-11-15 HISTORY — DX: Malignant neoplasm of prostate: C61

## 2021-11-15 HISTORY — DX: Personal history of colonic polyps: Z86.010

## 2021-11-15 LAB — POCT I-STAT, CHEM 8
BUN: 19 mg/dL (ref 8–23)
Calcium, Ion: 1.25 mmol/L (ref 1.15–1.40)
Chloride: 103 mmol/L (ref 98–111)
Creatinine, Ser: 1.2 mg/dL (ref 0.61–1.24)
Glucose, Bld: 154 mg/dL — ABNORMAL HIGH (ref 70–99)
HCT: 42 % (ref 39.0–52.0)
Hemoglobin: 14.3 g/dL (ref 13.0–17.0)
Potassium: 4.2 mmol/L (ref 3.5–5.1)
Sodium: 140 mmol/L (ref 135–145)
TCO2: 25 mmol/L (ref 22–32)

## 2021-11-15 SURGERY — CYSTOSCOPY, WITH URETHRAL DILATION
Anesthesia: General | Site: Ureter

## 2021-11-15 MED ORDER — FENTANYL CITRATE (PF) 100 MCG/2ML IJ SOLN
INTRAMUSCULAR | Status: AC
  Filled 2021-11-15: qty 2

## 2021-11-15 MED ORDER — FENTANYL CITRATE (PF) 100 MCG/2ML IJ SOLN
INTRAMUSCULAR | Status: DC | PRN
  Administered 2021-11-15: 50 ug via INTRAVENOUS
  Administered 2021-11-15: 25 ug via INTRAVENOUS

## 2021-11-15 MED ORDER — CEFAZOLIN SODIUM-DEXTROSE 2-4 GM/100ML-% IV SOLN
2.0000 g | INTRAVENOUS | Status: AC
  Administered 2021-11-15: 2 g via INTRAVENOUS

## 2021-11-15 MED ORDER — CEFAZOLIN SODIUM-DEXTROSE 2-4 GM/100ML-% IV SOLN
INTRAVENOUS | Status: AC
  Filled 2021-11-15: qty 100

## 2021-11-15 MED ORDER — 0.9 % SODIUM CHLORIDE (POUR BTL) OPTIME
TOPICAL | Status: DC | PRN
  Administered 2021-11-15: 500 mL

## 2021-11-15 MED ORDER — PROPOFOL 10 MG/ML IV BOLUS
INTRAVENOUS | Status: DC | PRN
  Administered 2021-11-15: 150 mg via INTRAVENOUS
  Administered 2021-11-15: 50 mg via INTRAVENOUS

## 2021-11-15 MED ORDER — LIDOCAINE HCL (CARDIAC) PF 100 MG/5ML IV SOSY
PREFILLED_SYRINGE | INTRAVENOUS | Status: DC | PRN
  Administered 2021-11-15: 40 mg via INTRAVENOUS

## 2021-11-15 MED ORDER — ACETAMINOPHEN 500 MG PO TABS
ORAL_TABLET | ORAL | Status: AC
  Filled 2021-11-15: qty 2

## 2021-11-15 MED ORDER — EPHEDRINE SULFATE (PRESSORS) 50 MG/ML IJ SOLN
INTRAMUSCULAR | Status: DC | PRN
  Administered 2021-11-15 (×3): 10 mg via INTRAVENOUS

## 2021-11-15 MED ORDER — SODIUM CHLORIDE 0.9 % IR SOLN
Status: DC | PRN
  Administered 2021-11-15: 3000 mL

## 2021-11-15 MED ORDER — SODIUM CHLORIDE (PF) 0.9 % IJ SOLN: INTRAMUSCULAR | Status: DC | PRN

## 2021-11-15 MED ORDER — LACTATED RINGERS IV SOLN: INTRAVENOUS | Status: DC

## 2021-11-15 MED ORDER — ONDANSETRON HCL 4 MG/2ML IJ SOLN
INTRAMUSCULAR | Status: DC | PRN
  Administered 2021-11-15: 4 mg via INTRAVENOUS

## 2021-11-15 MED ORDER — FENTANYL CITRATE (PF) 100 MCG/2ML IJ SOLN: 25.0000 ug | INTRAMUSCULAR | Status: DC | PRN

## 2021-11-15 MED ORDER — PHENYLEPHRINE HCL (PRESSORS) 10 MG/ML IV SOLN
INTRAVENOUS | Status: DC | PRN
  Administered 2021-11-15: 200 ug via INTRAVENOUS

## 2021-11-15 MED ORDER — ACETAMINOPHEN 500 MG PO TABS: 1000.0000 mg | ORAL_TABLET | Freq: Once | ORAL | Status: DC

## 2021-11-15 SURGICAL SUPPLY — 25 items
BAG DRAIN URO-CYSTO SKYTR STRL (DRAIN) ×2 IMPLANT
BAG DRN RND TRDRP ANRFLXCHMBR (UROLOGICAL SUPPLIES) ×1
BAG DRN UROCATH (DRAIN) ×1
BAG URINE DRAIN 2000ML AR STRL (UROLOGICAL SUPPLIES) ×1 IMPLANT
BAG URINE LEG 500ML (DRAIN) IMPLANT
BALLN NEPHROSTOMY (BALLOONS) ×2
BALLOON NEPHROSTOMY (BALLOONS) IMPLANT
CATH FOLEY 2WAY SLVR  5CC 16FR (CATHETERS) ×2
CATH FOLEY 2WAY SLVR  5CC 20FR (CATHETERS)
CATH FOLEY 2WAY SLVR  5CC 22FR (CATHETERS)
CATH FOLEY 2WAY SLVR 5CC 16FR (CATHETERS) IMPLANT
CATH FOLEY 2WAY SLVR 5CC 20FR (CATHETERS) IMPLANT
CATH FOLEY 2WAY SLVR 5CC 22FR (CATHETERS) IMPLANT
CATH INTERMIT  6FR 70CM (CATHETERS) IMPLANT
CLOTH BEACON ORANGE TIMEOUT ST (SAFETY) ×3 IMPLANT
GLOVE BIO SURGEON STRL SZ8 (GLOVE) ×2 IMPLANT
GOWN STRL REUS W/TWL XL LVL3 (GOWN DISPOSABLE) ×4 IMPLANT
GUIDEWIRE ANG ZIPWIRE 038X150 (WIRE) IMPLANT
GUIDEWIRE STR DUAL SENSOR (WIRE) ×1 IMPLANT
KIT TURNOVER CYSTO (KITS) ×2 IMPLANT
MANIFOLD NEPTUNE II (INSTRUMENTS) ×2 IMPLANT
NS IRRIG 500ML POUR BTL (IV SOLUTION) ×1 IMPLANT
PACK CYSTO (CUSTOM PROCEDURE TRAY) ×2 IMPLANT
TUBE CONNECTING 12X1/4 (SUCTIONS) IMPLANT
WATER STERILE IRR 3000ML UROMA (IV SOLUTION) ×2 IMPLANT

## 2021-11-15 NOTE — Discharge Instructions (Addendum)
You may see some blood in the urine and may have some burning with urination for 48-72 hours. You also may notice that you have to urinate more frequently or urgently after your procedure which is normal.  ?You should call should you develop an inability urinate, fever > 101, persistent nausea and vomiting that prevents you from eating or drinking to stay hydrated.  ? You may also intermittently have blood in the urine until the stent is removed. ?If you have a catheter, you will be taught how to take care of the catheter by the nursing staff prior to discharge from the hospital.  You may periodically feel a strong urge to void with the catheter in place.  This is a bladder spasm and most often can occur when having a bowel movement or moving around. It is typically self-limited and usually will stop after a few minutes.  You may use some Vaseline or Neosporin around the tip of the catheter to reduce friction at the tip of the penis. You may also see some blood in the urine.  A very small amount of blood can make the urine look quite red.  As long as the catheter is draining well, there usually is not a problem.  However, if the catheter is not draining well and is bloody, you should call the office [phone]) to notify us.  You can remove your catheter on Tuesday morning. ? ? ?No acetaminophen/Tylenol until after 12:00pm today if needed for pain. ? ? ? ?Post Anesthesia Home Care Instructions ? ?Activity: ?Get plenty of rest for the remainder of the day. A responsible individual must stay with you for 24 hours following the procedure.  ?For the next 24 hours, DO NOT: ?-Drive a car ?-Paediatric nurse ?-Drink alcoholic beverages ?-Take any medication unless instructed by your physician ?-Make any legal decisions or sign important papers. ? ?Meals: ?Start with liquid foods such as gelatin or soup. Progress to regular foods as tolerated. Avoid greasy, spicy, heavy foods. If nausea and/or vomiting occur, drink only  clear liquids until the nausea and/or vomiting subsides. Call your physician if vomiting continues. ? ?Special Instructions/Symptoms: ?Your throat may feel dry or sore from the anesthesia or the breathing tube placed in your throat during surgery. If this causes discomfort, gargle with warm salt water. The discomfort should disappear within 24 hours.    ?

## 2021-11-15 NOTE — Interval H&P Note (Signed)
History and Physical Interval Note: ? ?11/15/2021 ?10:18 AM ? ?Matthew [name]  has presented today for surgery, with the diagnosis of URETHRAL STRICTURE.  The various methods of treatment have been discussed with the patient and family. After consideration of risks, benefits and other options for treatment, the patient has consented to  Procedure(s): ?[place] (N/A) as a surgical intervention.  The patient's history has been reviewed, patient examined, no change in status, stable for surgery.  I have reviewed the patient's chart and labs.  Questions were answered to the patient's satisfaction.   ? ? ?[name] ? ? ?

## 2021-11-15 NOTE — Anesthesia Preprocedure Evaluation (Addendum)
Anesthesia Evaluation  ?Patient identified by MRN, date of birth, ID band ?Patient awake ? ? ? ?Reviewed: ?Allergy & Precautions, NPO status , Patient's Chart, lab work & pertinent test results, reviewed documented beta blocker date and time  ? ?Airway ?Mallampati: II ? ?TM Distance: >3 FB ?Neck ROM: Full ? ?Mouth opening: Limited Mouth Opening ? Dental ? ?(+) Missing, Chipped, Dental Advisory Given, Poor Dentition ?  ?Pulmonary ?neg pulmonary ROS,  ?  ?Pulmonary exam normal ?breath sounds clear to auscultation ? ? ? ? ? ? Cardiovascular ?hypertension, Pt. on home beta blockers and Pt. on medications ?+ Peripheral Vascular Disease and + Orthopnea  ?Normal cardiovascular exam ?Rhythm:Regular Rate:Normal ? ? ?  ?Neuro/Psych ?negative neurological ROS ? negative psych ROS  ? GI/Hepatic ?Neg liver ROS, hiatal hernia, GERD  ,  ?Endo/Other  ?negative endocrine ROS ? Renal/GU ?negative Renal ROS  ? ?H/o prostate CA ?negative genitourinary ?  ?Musculoskeletal ? ?(+) Arthritis ,  ? Abdominal ?  ?Peds ? Hematology ?negative hematology ROS ?(+)   ?Anesthesia Other Findings ? ? Reproductive/Obstetrics ? ?  ? ? ? ? ? ? ? ? ? ? ? ? ? ?  ?  ? ? ? ? ? ? ?Anesthesia Physical ?Anesthesia Plan ? ?ASA: 2 ? ?Anesthesia Plan: General  ? ?Post-op Pain Management:   ? ?Induction: Intravenous ? ?PONV Risk Score and Plan: 2 and Ondansetron, Dexamethasone and Treatment may vary due to age or medical condition ? ?Airway Management Planned: LMA ? ?Additional Equipment:  ? ?Intra-op Plan:  ? ?Post-operative Plan: Extubation in OR ? ?Informed Consent: I have reviewed the patients History and Physical, chart, labs and discussed the procedure including the risks, benefits and alternatives for the proposed anesthesia with the patient or authorized representative who has indicated his/her understanding and acceptance.  ? ? ? ?Dental advisory given ? ?Plan Discussed with: CRNA ? ?Anesthesia Plan Comments:    ? ? ? ? ? ? ?Anesthesia Quick Evaluation ? ?

## 2021-11-15 NOTE — Transfer of Care (Signed)
Immediate Anesthesia Transfer of Care Note ? ?Patient: Matthew [name] ? ?Procedure(s) Performed: CYSTOSCOPY WITH URETHRAL BALLOON  DILATATION ([facility]) ? ?Patient Location: PACU ? ?Anesthesia Type:General ? ?Level of Consciousness: awake, alert , oriented and patient cooperative ? ?Airway & Oxygen Therapy: Patient Spontanous Breathing ? ?Post-op Assessment: Report given to RN and Post -op Vital signs reviewed and stable ? ?Post vital signs: Reviewed and stable ? ?Last Vitals:  ?Vitals Value Taken Time  ?BP    ?Temp    ?Pulse 56 11/15/21 1107  ?Resp 9 11/15/21 1107  ?SpO2 94 % 11/15/21 1107  ?Vitals shown include unvalidated device data. ? ?Last Pain:  ?Vitals:  ? 11/15/21 0822  ?TempSrc: Oral  ?PainSc: 0-No pain  ?   ? ?Patients Stated Pain Goal: 5 (11/15/21 1017) ? ?Complications: No notable events documented. ?

## 2021-11-15 NOTE — Op Note (Addendum)
Preoperative diagnosis: Bladder neck contracture ? ?Postoperative diagnosis: Same, with distal urethral stricture ? ?Principal procedure: Cystoscopy, balloon dilation of urethral strictures, fluoroscopic iterpretation ? ?Surgeon: Dahlstedt ? ?Anesthesia: General with LMA ? ?Complications: None ? ?Estimated blood loss: Less than 10 mL ? ?Specimen: None ? ?Indications: 70-year-old male with history of prostate cancer, recurrent after radical prostatectomy with subsequent salvage radiotherapy.  He is many years out from these with stable PSA, on finasteride/bicalutamide for off label therapy for his PSA recurrence.  He has been having difficulty urinating.  Cystoscopy recently revealed dense bladder neck contracture.  He presents at this time for balloon dilation of this. ? ?Findings: There was a moderate distal urethral stricture that would not let the scope pass.  This was dilated, in addition to the bladder neck contracture, with balloon.  Urethra was quite narrow throughout although not strictured.  It would not pass a 22 French scope. ? ?Description of procedure: The patient was properly identified in the holding area, was taken to the operating room where general anesthetic was administered with the LMA.  He is placed in the dorsolithotomy position.  Genitalia and perineum were prepped, draped, proper timeout performed. ? ?22 French panendoscope advanced approximately 2 cm of the urethra.  There was a mild to moderate urethral stricture at this point which would not admit the beak of the scope.  I subsequently passed the guidewire using fluoroscopic guidance into the bladder.  Once a curl was seen, I negotiated the NephroMax balloon over top of the guidewire.  The distal urethral stricture was dilated with the balloon being inflated to 16 atm of pressure for approximately 3 minutes.  It was then deflated and passed the remainder of the way into the bladder, encompassing the bladder neck contracture.  At this  point the balloon was again inflated to 16 atm, left inflated for 5 minutes.  It was then deflated.  Urinary flow is excellent, as the patient did have retention of urine.  A mild amount of blood was coming from the urethra due to dilations.  At this point I decided that it would be best to leave the catheter.  Guidewire was removed, 16 French Foley catheter placed, balloon filled with 10 cc of fluid.  This was hooked to dependent drainage.  At this point the procedure was terminated.  The patient was awakened, taken to the PACU in stable condition having tolerated procedure well. ?

## 2021-11-15 NOTE — H&P (Signed)
H&P ? ?Chief Complaint: Difficulty urinating ? ?History of Present Illness:  71 year old male with history of prostate cancer (status post retropubic prostatectomy and salvage radiotherapy past) presents at this time for endoscopic management of a bladder neck contracture ? ?Past Medical History:  ?Diagnosis Date  ? Arthritis   ? Chronic low back pain   ? followed by [facility] -- dr Stanton Kidney [place]  ? Chronic narcotic use   ? DDD (degenerative disc disease), lumbosacral   ? ED (erectile dysfunction)   ? Esophageal diverticulum   ? GERD (gastroesophageal reflux disease)   ? Hiatal hernia   ? History of adenomatous polyp of colon   ? History of gout   ? History of Helicobacter pylori infection 09/2017  ? completed treatment  ? Hx of radiation therapy   ? 08-04-2005  to  09-23-2015 prostate bed//   12-06-2011 to 12-07-2021  for prophylactic bilateral breast (prevention of painful gynecomastia)  ? Hyperlipidemia   ? Hypertension   ? IDA (iron deficiency anemia)   ? Numbness and tingling in left arm   ? per pt left shoulder and arm  intermittant tingling, stated he has had several test , unknown cause, but can put arm up in the air tingling resolves  ? Pre-diabetes   ? Recurrent prostate carcinoma ([place])   ? urologist--- dr Diona Fanti;  original dx 07/ 2006, Gleason 3+3, PSA 4.3 (04-18-2005 s/p radiacal prostatectomy w/ node dissection)//  recurrent w/ increasing PSA , completed salvage radiation of prostate bed 09-22-2005  ? Urethral stricture   ? Urinary incontinence   ? Wears partial dentures   ? upper  ? ? ?Past Surgical History:  ?Procedure Laterality Date  ? CATARACT EXTRACTION W/ INTRAOCULAR LENS IMPLANT Bilateral 2006  ? COLONOSCOPY  2021  ? CYSTOSCOPY WITH URETHRAL DILATATION  07/16/2009  ? [facility]  by dr Diona Fanti;   balloon dilatation of stricture  ? ESOPHAGOGASTRODUODENOSCOPY (EGD) WITH ESOPHAGEAL DILATION  06/26/2018  ? by dr Loletha Carrow  ? INGUINAL HERNIA REPAIR Right 05/23/2012  ? NECK SURGERY    ? 1990s;   per  pt removal "goiter" that was benign, stated he does not think it was his thyroid  ? RETROPUBIC PROSTATECTOMY  04/18/2005  ? [facility]  by dr Diona Fanti  ? ? ?Home Medications:  ? ? ?Allergies: No Known Allergies ? ?Family History  ?Problem Relation Age of Onset  ? Colon polyps Neg Hx   ? Colon cancer Neg Hx   ? Esophageal cancer Neg Hx   ? Stomach cancer Neg Hx   ? Rectal cancer Neg Hx   ? ? ?Social History:  reports that he has never smoked. His smokeless tobacco use includes chew. He reports that he does not currently use alcohol. He reports that he does not use drugs. ? ?ROS: ?A complete review of systems was performed.  All systems are negative except for pertinent findings as noted. ? ?Physical Exam:  ?Vital signs in last 24 hours: ?BP 139/66   Pulse (!) 51   Temp 98.1 ?F (36.7 ?C) (Oral)   Resp 17   Ht '5\' 8"'$  (1.727 m)   Wt 98 kg   SpO2 99%   BMI 32.84 kg/m?  ?Constitutional:  Alert and oriented, No acute distress ?Cardiovascular: Regular rate  ?Respiratory: Normal respiratory effort ?GI: Abdomen is soft, nontender, nondistended, no abdominal masses. No CVAT.  ?Genitourinary: Normal male phallus, testes are descended bilaterally and non-tender and without masses, scrotum is normal in appearance without lesions or masses, perineum is  normal on inspection. ?Lymphatic: No lymphadenopathy ?Neurologic: Grossly intact, no focal deficits ?Psychiatric: Normal mood and affect ? ?I have reviewed prior pt notes ? ?I have reviewed notes from referring/previous physicians ? ?I have reviewed urinalysis results ? ?I have independently reviewed prior imaging ? ?I have reviewed prior PSA results ? ?I have reviewed prior urine culture ? ? ?Impression/Assessment:  ?[place] ? ?Plan:  ?[facility] ?: ?

## 2021-11-15 NOTE — Anesthesia Postprocedure Evaluation (Signed)
Anesthesia Post Note ? ?Patient: Matthew [name] ? ?Procedure(s) Performed: CYSTOSCOPY WITH URETHRAL BALLOON  DILATATION ([facility]) ? ?  ? ?Patient location during evaluation: PACU ?Anesthesia Type: General ?Level of consciousness: awake and alert ?Pain management: pain level controlled ?Vital Signs Assessment: post-procedure vital signs reviewed and stable ?Respiratory status: spontaneous breathing, nonlabored ventilation, respiratory function stable and patient connected to nasal cannula oxygen ?Cardiovascular status: blood pressure returned to baseline and stable ?Postop Assessment: no apparent nausea or vomiting ?Anesthetic complications: no ? ? ?No notable events documented. ? ?Last Vitals:  ?Vitals:  ? 11/15/21 1130 11/15/21 1200  ?BP: (!) 144/66 135/68  ?Pulse: (!) 53 (!) 50  ?Resp: 13 14  ?Temp: 36.4 ?C 36.4 ?C  ?SpO2: 95% 95%  ?  ?Last Pain:  ?Vitals:  ? 11/15/21 1200  ?TempSrc:   ?PainSc: 0-No pain  ? ? ?  ?  ?  ?  ?  ?  ? ?[name] L Denese Mentink ? ? ? ? ?

## 2021-11-15 NOTE — Anesthesia Procedure Notes (Signed)
Procedure Name: LMA Insertion ?Date/Time: 11/15/2021 10:38 AM ?Performed by: Georgeanne Nim, CRNA ?Pre-anesthesia Checklist: Patient identified, Emergency Drugs available, Suction available, Patient being monitored and Timeout performed ?Patient Re-evaluated:Patient Re-evaluated prior to induction ?Oxygen Delivery Method: Circle system utilized ?Preoxygenation: Pre-oxygenation with 100% oxygen ?Induction Type: IV induction ?Ventilation: Mask ventilation without difficulty ?LMA: LMA inserted ?LMA Size: 4.0 ?Number of attempts: 1 ?Placement Confirmation: positive ETCO2, CO2 detector and breath sounds checked- equal and bilateral ?Tube secured with: Tape ?Dental Injury: Teeth and Oropharynx as per pre-operative assessment  ? ? ? ? ?

## 2021-11-16 ENCOUNTER — Encounter (HOSPITAL_BASED_OUTPATIENT_CLINIC_OR_DEPARTMENT_OTHER): Payer: Self-pay | Admitting: Urology

## 2021-11-27 ENCOUNTER — Ambulatory Visit
Admission: RE | Admit: 2021-11-27 | Discharge: 2021-11-27 | Disposition: A | Discharge status: Home / Self Care | Payer: Medicare Other | Source: Ambulatory Visit | Attending: Physical Medicine and Rehabilitation | Admitting: Physical Medicine and Rehabilitation

## 2021-11-27 DIAGNOSIS — M5412 Radiculopathy, cervical region: Secondary | ICD-10-CM

## 2021-12-07 ENCOUNTER — Other Ambulatory Visit: Payer: Self-pay | Admitting: Geriatric Medicine

## 2021-12-07 ENCOUNTER — Ambulatory Visit
Admission: RE | Admit: 2021-12-07 | Discharge: 2021-12-07 | Disposition: A | Discharge status: Home / Self Care | Payer: Medicare Other | Source: Ambulatory Visit | Attending: Geriatric Medicine | Admitting: Geriatric Medicine

## 2021-12-07 DIAGNOSIS — M542 Cervicalgia: Secondary | ICD-10-CM

## 2021-12-24 ENCOUNTER — Ambulatory Visit: Payer: Medicare Other | Attending: Geriatric Medicine | Admitting: Physical Therapy

## 2021-12-24 ENCOUNTER — Encounter: Payer: Self-pay | Admitting: Physical Therapy

## 2021-12-24 DIAGNOSIS — M542 Cervicalgia: Secondary | ICD-10-CM | POA: Insufficient documentation

## 2021-12-24 NOTE — Therapy (Addendum)
OUTPATIENT PHYSICAL THERAPY SHOULDER EVALUATION   Patient Name: Matthew [name] MRN: [id number] DOB:August 02, 1950, 71 y.o., male Today's Date: 12/24/2021   PT End of Session - 12/24/21 1050     Visit Number 1    Number of Visits --   1-2x/week   Date for PT Re-Evaluation 02/18/22    Authorization Type [employer] MCR    PT Start Time 1000    PT Stop Time 1038    PT Time Calculation (min) 38 min             Past Medical History:  Diagnosis Date   Arthritis    Chronic low back pain    followed by [facility] -- dr Stanton Kidney bobca   Chronic narcotic use    DDD (degenerative disc disease), lumbosacral    ED (erectile dysfunction)    Esophageal diverticulum    GERD (gastroesophageal reflux disease)    Hiatal hernia    History of adenomatous polyp of colon    History of gout    History of Helicobacter pylori infection 09/2017   completed treatment   Hx of radiation therapy    08-04-2005  to  09-23-2015 prostate bed//   12-06-2011 to 12-07-2021  for prophylactic bilateral breast (prevention of painful gynecomastia)   Hyperlipidemia    Hypertension    IDA (iron deficiency anemia)    Numbness and tingling in left arm    per pt left shoulder and arm  intermittant tingling, stated he has had several test , unknown cause, but can put arm up in the air tingling resolves   Pre-diabetes    Recurrent prostate carcinoma [facility])    urologist--- dr Diona Fanti;  original dx 07/ 2006, Gleason 3+3, PSA 4.3 (04-18-2005 s/p radiacal prostatectomy w/ node dissection)//  recurrent w/ increasing PSA , completed salvage radiation of prostate bed 09-22-2005   Urethral stricture    Urinary incontinence    Wears partial dentures    upper   Past Surgical History:  Procedure Laterality Date   CATARACT EXTRACTION W/ INTRAOCULAR LENS IMPLANT Bilateral 2006   COLONOSCOPY  2021   CYSTOSCOPY WITH URETHRAL DILATATION  07/16/2009   '@[facility]'$  by dr Diona Fanti;   balloon dilatation of stricture   CYSTOSCOPY WITH URETHRAL  DILATATION N/A 11/15/2021   Procedure: CYSTOSCOPY WITH URETHRAL BALLOON  DILATATION;  Surgeon: Franchot Gallo, MD;  Location: [facility];  Service: Urology;  Laterality: N/A;   ESOPHAGOGASTRODUODENOSCOPY (EGD) WITH ESOPHAGEAL DILATION  06/26/2018   by dr Loletha Carrow   INGUINAL HERNIA REPAIR Right 05/23/2012   NECK SURGERY     1990s;   per pt removal "goiter" that was benign, stated he does not think it was his thyroid   RETROPUBIC PROSTATECTOMY  04/18/2005   '@[facility]'$  by dr Diona Fanti   Patient Active Problem List   Diagnosis Date Noted   PAD (peripheral artery disease) ([place]) 06/19/2019   Chest pain 05/22/2019   Orthopnea 05/22/2019   Screening for AAA (abdominal aortic aneurysm) 05/22/2019   Tobacco abuse 05/22/2019   Impingement syndrome of right shoulder region 01/18/2018   Hypertension 07/10/2017   Pain of both shoulder joints 05/03/2017   Sleep disturbance 11/15/2012   Snoring 11/15/2012   Chronic back pain 11/15/2012   Cancer ([place]) 02/11/2005    PCP: Lajean Manes, MD  REFERRING PROVIDER: Lajean Manes, MD  THERAPY DIAG:  Cervicalgia  REFERRING DIAG: Referral diagnosis: Cervicalgia [M54.2]  Rationale for Evaluation and Treatment Rehabilitation  SUBJECTIVE:  PERTINENT PAST HISTORY:  Gout, hx of prostate  cancer        PRECAUTIONS: None  WEIGHT BEARING RESTRICTIONS No  FALLS:  Has patient fallen in last 6 months? No, Number of falls: 0  MOI/History of condition:  Onset date: 3-4 months  Braiden [name] is a 71 y.o. male who presents to clinic with chief complaint of neck pain with L side UE radicular pain.  Denies trauma.  Radiating from L Cx spine to hand.  Exacerbated by ext and L rotation.  Endorses all finger L hand n/t at times.  MRI shows significant neural foraminal stenosis.  Pain MD told him he had ulnar nerve compression at elbow (per pt) and issued wrist brace.   Red flags:  denies   Pain:  Are you having pain? No Pain location: L>R  cervical pain with radiating to L hand NPRS scale:  current 0/10  average 7/10  Aggravating factors: L rotation, cervical ext  NPRS, highest: 9/10 Relieving factors: rest, neutral position   NPRS: best: 0/10 Pain description: constant, sharp, and electrical feeling Stage: Acute Stability: staying the same 24 hour pattern: worse at night   Occupation: retired  Administrator, sports: NA  Hand Dominance: R  Patient Goals/Specific Activities: reduce pain   OBJECTIVE:   DIAGNOSTIC FINDINGS:  IMPRESSION: 1. Multilevel degenerative disc and joint changes as above. 2. Multilevel neuroforaminal stenosis including mild left C2-3, moderate right-greater-than-left C3-4, severe right and moderate left C4-5, and severe left C5-6 neuroforaminal stenosis.  GENERAL OBSERVATION:  Forward head, rounded shoulders     SENSATION:  Light touch: Appears intact on exam but n/t during subjective exam in L fingers   PALPATION: TTP L UT  Cervical ROM  ROM ROM  12/24/2021  Flexion 40  Extension 15**  Right lateral flexion 25  Left lateral flexion 15**  Right rotation 65  Left rotation 20**  Flexion rotation (normal is 30 degrees)   Flexion rotation (normal is 30 degrees)     (Blank rows = not tested, N = WNL, * = concordant pain)  UPPER EXTREMITY MMT:  MMT Right 12/24/2021 Left 12/24/2021  Shoulder flexion N N  Shoulder abduction (C5) C C  Shoulder ER    Shoulder IR    Middle trapezius    Lower trapezius    Shoulder extension    Grip strength 27kg 26kg  Cervical flexion (C1,C2)    Cervical S/B (C3)    Shoulder shrug (C4) C C  Elbow flexion (C6) C C  Elbow ext (C7) C C  Thumb ext (C8) C C  Finger abd (T1) C C  Grossly     (Blank rows = not tested, score listed is out of 5 possible points.  N = WNL, D = diminished, C = clear for gross weakness with myotome testing, * = concordant pain with testing)   SPECIAL TESTS:  Spurling (+)  ULTTA (+)  Phalen's and reverse phalen's  (-)  JOINT MOBILITY TESTING:  Hypomobile throughout Cx pain  PATIENT SURVEYS:  Neck Disability Index: 17 / 50 = 34.0 %  TODAY'S TREATMENT:  Manual therapy: Skilled palpation to identify trigger points for TDN  Trigger Point Dry-Needling  Treatment instructions: Expect mild to moderate muscle soreness. S/S of pneumothorax if dry needled over a lung field, and to seek immediate medical attention should they occur. Patient verbalized understanding of these instructions and education.  Patient Consent Given: Yes Education handout provided: No Muscles treated: L UT and L sub occipitals, L cervical multifidus Electrical stimulation performed: No Parameters: N/A Treatment response/outcome:  twitch      PATIENT EDUCATION:  POC, diagnosis, prognosis, HEP, and outcome measures.  Pt educated via explanation, demonstration, and handout (HEP).  Pt confirms understanding verbally.    HOME EXERCISE PROGRAM: None  ASTERISK SIGNS   Asterisk Signs Eval (12/24/2021)       Cervical ROM L rotation 15 degrees       Pain level 9/10 max                                 ASSESSMENT:  CLINICAL IMPRESSION: Daymion is a 71 y.o. male who presents to clinic with signs and sxs consistent with L sided cervical radiculopathy.  Consistent with MRI results.  N/t is not limited to ulnar nerve distribution so this is lower as main generator of pain and n/t.  No gross weakness at this point.  TDN provided significant improvement in L rotation to roughly 40 degrees from 15 with reduced pain.  Pt unable to tolerate chin tuck d/t exacerbation of sxs.    OBJECTIVE IMPAIRMENTS: Pain, cervical ROM  ACTIVITY LIMITATIONS: driving, lifting, severe pain during ADLs  PERSONAL FACTORS: See medical history and pertinent history   REHAB POTENTIAL: Fair significant stenosis on MRI  CLINICAL DECISION MAKING: Evolving/moderate complexity  EVALUATION COMPLEXITY: Moderate   GOALS:   SHORT TERM GOALS: Target date:  01/14/2022  Miriam will be >75% HEP compliant to improve carryover between sessions and facilitate independent management of condition  Evaluation (12/24/2021): ongoing Goal status: INITIAL   LONG TERM GOALS: Target date: 02/18/2022  Mannix will self report >/= 50% decrease in pain from evaluation   Evaluation/Baseline (12/24/2021): 9/10 max pain Goal status: INITIAL   2.  Gadiel will improve cervical L rotation to >/= 50 degrees  Evaluation/Baseline (12/24/2021): 15 degrees with pain Goal status: INITIAL   3.  Trai will show a >/= 7.5 pt improvement in their NDI score (MCID 7.5 pts) as a proxy for functional improvement  Evaluation/Baseline (12/24/2021): Neck Disability Index: 17 / 50 = 34.0 % pts Goal status: INITIAL   4.  [place]ston will be able to drive and complete essential ADLs with tolerable pain levels.  Evaluation/Baseline (12/24/2021): pain as high as 9/10 Goal status: INITIAL    PLAN: PT FREQUENCY: 1-2x/week  PT DURATION: 8 weeks (Ending 02/18/2022)  PLANNED INTERVENTIONS: Therapeutic exercises, Aquatic therapy, Therapeutic activity, Neuro Muscular re-education, Gait training, Patient/Family education, Joint mobilization, Dry Needling, Electrical stimulation, Spinal mobilization and/or manipulation, Moist heat, Taping, Vasopneumatic device, Ionotophoresis '4mg'$ /ml Dexamethasone, and Manual therapy  PLAN FOR NEXT SESSION: TDN and manual therapy, progressive stretching and periscapular/cervical strengthening as tolerated (to tolerated on eval)   Shearon Balo PT, DPT 12/24/2021, 10:51 AM

## 2021-12-29 NOTE — Therapy (Signed)
OUTPATIENT PHYSICAL THERAPY TREATMENT NOTE   Patient Name: Matthew [name] MRN: [id number] DOB:[birth year], 71 y.o., male Today's Date: 12/30/2021  PCP: Lajean Manes, MD REFERRING PROVIDER: Lajean Manes, MD  END OF SESSION:   PT End of Session - 12/30/21 1621     Visit Number 2    Number of Visits --   1-2x/week   Date for PT Re-Evaluation 02/18/22    Authorization Type UHC MCR    PT Start Time 8786    PT Stop Time 1504    PT Time Calculation (min) 42 min    Activity Tolerance Patient tolerated treatment well    Behavior During Therapy WFL for tasks assessed/performed             Past Medical History:  Diagnosis Date   Arthritis    Chronic low back pain    [facility]ed [facility] -- dr Stanton Kidney bobca   Chronic narcotic use    DDD (degenerative disc disease), lumbosacral    ED (erectile dysfunction)    Esophageal diverticulum    GERD (gastroesophageal reflux disease)    Hiatal hernia    History of adenomatous polyp of colon    History of gout    History of Helicobacter pylori infection 09/2017   completed treatment   Hx of radiation therapy    08-04-2005  to  09-23-2015 prostate bed//   12-06-2011 to 12-07-2021  for prophylactic bilateral breast (prevention of painful gynecomastia)   Hyperlipidemia    Hypertension    IDA (iron deficiency anemia)    Numbness and tingling in left arm    per pt left shoulder and arm  intermittant tingling, stated he has had several test , unknown cause, but can put arm up in the air tingling resolves   Pre-diabetes    Recurrent prostate carcinoma [facility])    urologist--- dr Diona Fanti;  original dx 07/ 2006, Gleason 3+3, PSA 4.3 (04-18-2005 s/p radiacal prostatectomy w/ node dissection)//  recurrent w/ increasing PSA , completed salvage radiation of prostate bed 09-22-2005   Urethral stricture    Urinary incontinence    Wears partial dentures    upper   Past Surgical History:  Procedure Laterality Date   CATARACT EXTRACTION W/  INTRAOCULAR LENS IMPLANT Bilateral 2006   COLONOSCOPY  2021   CYSTOSCOPY WITH URETHRAL DILATATION  07/16/2009   '@[facility]'$  by dr Diona Fanti;   balloon dilatation of stricture   CYSTOSCOPY WITH URETHRAL DILATATION N/A 11/15/2021   Procedure: CYSTOSCOPY WITH URETHRAL BALLOON  DILATATION;  Surgeon: Franchot Gallo, MD;  Location: [facility];  Service: Urology;  Laterality: N/A;   ESOPHAGOGASTRODUODENOSCOPY (EGD) WITH ESOPHAGEAL DILATION  06/26/2018   by dr Loletha Carrow   INGUINAL HERNIA REPAIR Right 05/23/2012   NECK SURGERY     1990s;   per pt removal "goiter" that was benign, stated he does not think it was his thyroid   RETROPUBIC PROSTATECTOMY  04/18/2005   '@[facility]'$  by dr Diona Fanti   Patient Active Problem List   Diagnosis Date Noted   PAD (peripheral artery disease) ([place]) 06/19/2019   Chest pain 05/22/2019   Orthopnea 05/22/2019   Screening for AAA (abdominal aortic aneurysm) 05/22/2019   Tobacco abuse 05/22/2019   Impingement syndrome of right shoulder region 01/18/2018   Hypertension 07/10/2017   Pain of both shoulder joints 05/03/2017   Sleep disturbance 11/15/2012   Snoring 11/15/2012   Chronic back pain 11/15/2012   Cancer ([place]) 02/11/2005    REFERRING DIAG: Cervicalgia  THERAPY DIAG:  Cervicalgia  Rationale for Evaluation and Treatment Rehabilitation  SUBJECTIVE: Pt reports good results from the TPDN last week with better neck pain  PAIN:  Are you having pain? Yes: NPRS scale: 9.5/10 Pain location: L arm Pain description: ache Aggravating factors: Driving Relieving factors: shaking my L arm, biofreeze and a heating pad   SUBJECTIVE:   PERTINENT PAST HISTORY:  Gout, hx of prostate cancer        PRECAUTIONS: None   WEIGHT BEARING RESTRICTIONS No    MOI/History of condition:   Onset date: 3-4 months   Pain:  Are you having pain? No for cervical Pain location: L>R cervical pain with radiating to L hand. L arm 9.5/10 NPRS scale:  current 0/10   average 7/10  Aggravating factors: L rotation, cervical ext, driving           NPRS, highest: 9/10 Relieving factors: rest, neutral position, shaking arm, biofreeze c a heating pad on the L arm            NPRS: best: 0/10 Pain description: constant, sharp, and electrical feeling Stage: Acute Stability: staying the same 24 hour pattern: worse at night    Occupation: retired   Administrator, sports: NA   Hand Dominance: R   Patient Goals/Specific Activities: reduce pain     OBJECTIVE: (objective measures completed at initial evaluation unless otherwise dated)   DIAGNOSTIC FINDINGS:  IMPRESSION: 1. Multilevel degenerative disc and joint changes as above. 2. Multilevel neuroforaminal stenosis including mild left C2-3, moderate right-greater-than-left C3-4, severe right and moderate left C4-5, and severe left C5-6 neuroforaminal stenosis.   GENERAL OBSERVATION:          Forward head, rounded shoulders                               SENSATION:          Light touch: Appears intact on exam but n/t during subjective exam in L fingers           PALPATION: TTP L UT   Cervical ROM   ROM ROM  12/24/2021  Flexion 40  Extension 15**  Right lateral flexion 25  Left lateral flexion 15**  Right rotation 65  Left rotation 20**  Flexion rotation (normal is 30 degrees)    Flexion rotation (normal is 30 degrees)      (Blank rows = not tested, N = WNL, * = concordant pain)   UPPER EXTREMITY MMT:   MMT Right 12/24/2021 Left 12/24/2021  Shoulder flexion N N  Shoulder abduction (C5) C C  Shoulder ER      Shoulder IR      Middle trapezius      Lower trapezius      Shoulder extension      Grip strength 27kg 26kg  Cervical flexion (C1,C2)      Cervical S/B (C3)      Shoulder shrug (C4) C C  Elbow flexion (C6) C C  Elbow ext (C7) C C  Thumb ext (C8) C C  Finger abd (T1) C C  Grossly        (Blank rows = not tested, score listed is out of 5 possible points.  N = WNL, D = diminished,  C = clear for gross weakness with myotome testing, * = concordant pain with testing)     SPECIAL TESTS:           Spurling (+)  ULTTA (+)           Phalen's and reverse phalen's (-)   JOINT MOBILITY TESTING:  Hypomobile throughout Cx pain   PATIENT SURVEYS:  Neck Disability Index: 17 / 50 = 34.0 %   TODAY'S TREATMENT:  [facility] Adult PT Treatment:                                                DATE: 12/30/21 Therapeutic Exercise: DNF in supine c 2 pillows under head x10, 5 sec (tolerated well) Seated cervical retraction x3, pt reported L arm pain and the ex was stopped L Upper trap c R lat flexion c anchored L UE x4 15sec Manual Therapy: STM/DTM to the bilat upper trap and cervical paraspinals Manual cervical traction c R lat. Flexion Skilled palpation to identify trigger points for TPDN  Trigger Point Dry Needling Treatment: Pre-treatment instruction: Patient instructed on dry needling rationale, procedures, and possible side effects including pain during treatment (achy,cramping feeling), bruising, drop of blood, lightheadedness, nausea, sweating. Patient Consent Given: Yes Education handout provided: Yes Muscles treated: L upper trap, L lower multifidi, L lower cervical paraspinals Needle size and number: .30x54m x 2 Electrical stimulation performed: No Parameters: N/A Treatment response/outcome: Twitch response elicited and Palpable decrease in muscle tension Post-treatment instructions: Patient instructed to expect possible mild to moderate muscle soreness later today and/or tomorrow. Patient instructed in methods to reduce muscle soreness and to continue prescribed HEP. If patient was dry needled over the lung field, patient was instructed on signs and symptoms of pneumothorax and, however unlikely, to see immediate medical attention should they occur. Patient was also educated on signs and symptoms of infection and to seek medical attention should they occur. Patient  verbalized understanding of these instructions and education.   Eval treament: Manual therapy: Skilled palpation to identify trigger points for TDN   Trigger Point Dry-Needling  Treatment instructions: Expect mild to moderate muscle soreness. S/S of pneumothorax if dry needled over a lung field, and to seek immediate medical attention should they occur. Patient verbalized understanding of these instructions and education.   Patient Consent Given: Yes Education handout provided: No Muscles treated: R UT and R sub occipitals, R cervical multifidus Electrical stimulation performed: No Parameters: N/A Treatment response/outcome: twitch                   PATIENT EDUCATION:  POC, diagnosis, prognosis, HEP, and outcome measures.  Pt educated via explanation, demonstration, and handout (HEP).  Pt confirms understanding verbally.             HOME EXERCISE PROGRAM: None   ASTERISK SIGNS     Asterisk Signs Eval (12/24/2021)            Cervical ROM L rotation 15 degrees            Pain level 9/10 max                                                             ASSESSMENT:   CLINICAL IMPRESSION: PT was completed for manual therapy including STM/DTM and manual traction c R lat flexion. L arm pain was resolved  c manual traction c R lat flexion. Pt then received TPDN to the L lower cervical multifidi and paraspinals and upper trap. TPDN was f/b therex with seated cervical retraction aggravating L arm pain and R cervical lat flexion resolving the L arm pain. After the session , pt continued to report his L arm pain was resolved.   OBJECTIVE IMPAIRMENTS: Pain, cervical ROM   ACTIVITY LIMITATIONS: driving, lifting, severe pain during ADLs   PERSONAL FACTORS: See medical history and pertinent history   REHAB POTENTIAL: Fair significant stenosis on MRI   CLINICAL DECISION MAKING: Evolving/moderate complexity   EVALUATION COMPLEXITY: Moderate     GOALS:     SHORT TERM GOALS: Target  date: 01/14/2022   Miken will be >75% HEP compliant to improve carryover between sessions and facilitate independent management of condition   Evaluation (12/24/2021): ongoing Goal status: INITIAL     LONG TERM GOALS: Target date: 02/18/2022   Anush will self report >/= 50% decrease in pain from evaluation    Evaluation/Baseline (12/24/2021): 9/10 max pain Goal status: INITIAL     2.  Garland will improve cervical L rotation to >/= 50 degrees   Evaluation/Baseline (12/24/2021): 15 degrees with pain Goal status: INITIAL     3.  Syd will show a >/= 7.5 pt improvement in their NDI score (MCID 7.5 pts) as a proxy for functional improvement   Evaluation/Baseline (12/24/2021): Neck Disability Index: 17 / 50 = 34.0 % pts Goal status: INITIAL     4.  Ayden will be able to drive and complete essential ADLs with tolerable pain levels.   Evaluation/Baseline (12/24/2021): pain as high as 9/10 Goal status: INITIAL       PLAN: PT FREQUENCY: 1-2x/week   PT DURATION: 8 weeks (Ending 02/18/2022)   PLANNED INTERVENTIONS: Therapeutic exercises, Aquatic therapy, Therapeutic activity, Neuro Muscular re-education, Gait training, Patient/Family education, Joint mobilization, Dry Needling, Electrical stimulation, Spinal mobilization and/or manipulation, Moist heat, Taping, Vasopneumatic device, Ionotophoresis '4mg'$ /ml Dexamethasone, and Manual therapy   PLAN FOR NEXT SESSION: TDN and manual therapy, progressive stretching and periscapular/cervical strengthening as tolerated (to tolerated on eval), assess response to L upper trap stretch    [employer] MS, PT 12/30/21 6:11 PM

## 2021-12-30 ENCOUNTER — Ambulatory Visit: Payer: Medicare Other

## 2021-12-30 DIAGNOSIS — M542 Cervicalgia: Secondary | ICD-10-CM

## 2022-01-03 NOTE — Therapy (Signed)
OUTPATIENT PHYSICAL THERAPY TREATMENT NOTE   Patient Name: Matthew [name] MRN: [id number] DOB:[birth year], 71 y.o., male Today's Date: 01/04/2022  PCP: Lajean Manes, MD REFERRING PROVIDER: Lajean Manes, MD  END OF SESSION:   PT End of Session - 01/04/22 1524     Visit Number 3    Number of Visits --   1-2x/week   Date for PT Re-Evaluation 02/18/22    Authorization Type [employer] MCR    PT Start Time 6283    PT Stop Time 1517    PT Time Calculation (min) 45 min    Activity Tolerance Patient tolerated treatment well    Behavior During Therapy WFL for tasks assessed/performed              Past Medical History:  Diagnosis Date   Arthritis    Chronic low back pain    followed by [facility] -- dr Stanton Kidney bobca   Chronic narcotic use    DDD (degenerative disc disease), lumbosacral    ED (erectile dysfunction)    Esophageal diverticulum    GERD (gastroesophageal reflux disease)    Hiatal hernia    History of adenomatous polyp of colon    History of gout    History of Helicobacter pylori infection 09/2017   completed treatment   Hx of radiation therapy    08-04-2005  to  09-23-2015 prostate bed//   12-06-2011 to 12-07-2021  for prophylactic bilateral breast (prevention of painful gynecomastia)   Hyperlipidemia    Hypertension    IDA (iron deficiency anemia)    Numbness and tingling in left arm    per pt left shoulder and arm  intermittant tingling, stated he has had several test , unknown cause, but can put arm up in the air tingling resolves   Pre-diabetes    Recurrent prostate carcinoma [facility])    urologist--- dr Diona Fanti;  original dx 07/ 2006, Gleason 3+3, PSA 4.3 (04-18-2005 s/p radiacal prostatectomy w/ node dissection)//  recurrent w/ increasing PSA , completed salvage radiation of prostate bed 09-22-2005   Urethral stricture    Urinary incontinence    Wears partial dentures    upper   Past Surgical History:  Procedure Laterality Date   CATARACT EXTRACTION W/  INTRAOCULAR LENS IMPLANT Bilateral 2006   COLONOSCOPY  2021   CYSTOSCOPY WITH URETHRAL DILATATION  07/16/2009   '@[facility]'$  by dr Diona Fanti;   balloon dilatation of stricture   CYSTOSCOPY WITH URETHRAL DILATATION N/A 11/15/2021   Procedure: CYSTOSCOPY WITH URETHRAL BALLOON  DILATATION;  Surgeon: Franchot Gallo, MD;  Location: [facility];  Service: Urology;  Laterality: N/A;   ESOPHAGOGASTRODUODENOSCOPY (EGD) WITH ESOPHAGEAL DILATION  06/26/2018   by dr Loletha Carrow   INGUINAL HERNIA REPAIR Right 05/23/2012   NECK SURGERY     1990s;   per pt removal "goiter" that was benign, stated he does not think it was his thyroid   RETROPUBIC PROSTATECTOMY  04/18/2005   '@[facility]'$  by dr Diona Fanti   Patient Active Problem List   Diagnosis Date Noted   PAD (peripheral artery disease) ([place]) 06/19/2019   Chest pain 05/22/2019   Orthopnea 05/22/2019   Screening for AAA (abdominal aortic aneurysm) 05/22/2019   Tobacco abuse 05/22/2019   Impingement syndrome of right shoulder region 01/18/2018   Hypertension 07/10/2017   Pain of both shoulder joints 05/03/2017   Sleep disturbance 11/15/2012   Snoring 11/15/2012   Chronic back pain 11/15/2012   Cancer ([place]) 02/11/2005    REFERRING DIAG: Cervicalgia  THERAPY DIAG:  Cervicalgia  Rationale for Evaluation and Treatment Rehabilitation  SUBJECTIVE: Patient reports slight improvement in his symptoms, but he continues to have pain from the left shoulder to the hand. Sometimes he can go 2-3 hours and feel pretty good and then the pain will come back with activity.   PAIN:  Are you having pain? Yes:  NPRS scale: 8/10 Pain location: L arm Pain description: ache Aggravating factors: Driving Relieving factors: shaking my L arm, biofreeze and a heating pad   PERTINENT PAST HISTORY:  Gout, hx of prostate cancer        PRECAUTIONS: None   WEIGHT BEARING RESTRICTIONS No  Patient Goals/Specific Activities: reduce pain     OBJECTIVE: (objective  measures completed at initial evaluation unless otherwise dated)   DIAGNOSTIC FINDINGS:  IMPRESSION: 1. Multilevel degenerative disc and joint changes as above. 2. Multilevel neuroforaminal stenosis including mild left C2-3, moderate right-greater-than-left C3-4, severe right and moderate left C4-5, and severe left C5-6 neuroforaminal stenosis.   GENERAL OBSERVATION:          Forward head, rounded shoulders                               SENSATION:          Light touch: Appears intact on exam but n/t during subjective exam in L fingers           PALPATION: TTP L UT   Cervical ROM   ROM ROM  12/24/2021  Flexion 40  Extension 15**  Right lateral flexion 25  Left lateral flexion 15**  Right rotation 65  Left rotation 20**  Flexion rotation (normal is 30 degrees)    Flexion rotation (normal is 30 degrees)      (Blank rows = not tested, N = WNL, * = concordant pain)   UPPER EXTREMITY MMT:   MMT Right 12/24/2021 Left 12/24/2021  Shoulder flexion N N  Shoulder abduction (C5) C C  Shoulder ER      Shoulder IR      Middle trapezius      Lower trapezius      Shoulder extension      Grip strength 27kg 26kg  Cervical flexion (C1,C2)      Cervical S/B (C3)      Shoulder shrug (C4) C C  Elbow flexion (C6) C C  Elbow ext (C7) C C  Thumb ext (C8) C C  Finger abd (T1) C C  Grossly        (Blank rows = not tested, score listed is out of 5 possible points.  N = WNL, D = diminished, C = clear for gross weakness with myotome testing, * = concordant pain with testing)   SPECIAL TESTS:           Spurling (+)           ULTTA (+)           Phalen's and reverse phalen's (-)   JOINT MOBILITY TESTING:  Hypomobile throughout Cx pain   PATIENT SURVEYS:  Neck Disability Index: 17 / 50 = 34.0 %    TODAY'S TREATMENT:  [facility] Adult PT Treatment:                                                DATE: 01/04/2022 Therapeutic  Exercise: UBE L5 x 4 min (2 fwd/bwd) while assessing response to dry  needling Supine chin tuck with 2 pillows under head 2 x 10 with 5 sec hold Doorway pec stretch at 90 deg 2 x 30 sec Seated shoulder blade squeezes 10 x 5 sec Seated upper trap stretch 2 x 20 sec Manual Therapy: Skilled palpation and monitoring of muscle tension while performing TPDN treatment STM for upper trap, infraspinatus, deltoid regions Suboccipital release with gentle manual traction Trigger Point Dry Needling Treatment: Pre-treatment instruction: Patient instructed on dry needling rationale, procedures, and possible side effects including pain during treatment (achy,cramping feeling), bruising, drop of blood, lightheadedness, nausea, sweating. Patient Consent Given: Yes Education handout provided: Previously provided Muscles treated: Left upper trap, infraspinatus, middle/posterior deltoid  Needle size and number: .30x23m x 5 Electrical stimulation performed: No Parameters: N/A Treatment response/outcome: Twitch response elicited and Palpable decrease in muscle tension Post-treatment instructions: Patient instructed to expect possible mild to moderate muscle soreness later today and/or tomorrow. Patient instructed in methods to reduce muscle soreness and to continue prescribed HEP. If patient was dry needled over the lung field, patient was instructed on signs and symptoms of pneumothorax and, however unlikely, to see immediate medical attention should they occur. Patient was also educated on signs and symptoms of infection and to seek medical attention should they occur. Patient verbalized understanding of these instructions and education.   [facility]                                                [facility]: [facility]30[facility]21 Therapeutic Exercise: DNF in supine c 2 pillows under head x10, 5 sec (tolerated well) Seated cervical retraction x3, pt reported L arm pain and the ex was stopped L Upper trap c R lat flexion c anchored L UE x4 15sec Manual Therapy: STM/DTM to the bilat  upper trap and cervical paraspinals Manual cervical traction c R lat. Flexion Skilled palpation to identify trigger points for TPDN Trigger Point Dry Needling Treatment: Pre-treatment instruction: Patient instructed on dry needling rationale, procedures, and possible side effects including pain during treatment (achy,cramping feeling), bruising, drop of blood, lightheadedness, nausea, sweating. Patient Consent Given: Yes Education handout provided: Yes Muscles treated: L upper trap, L lower multifidi, L lower cervical paraspinals Needle size and number: .30x51mx 2 Electrical stimulation performed: No Parameters: N/A Treatment response/outcome: Twitch response elicited and Palpable decrease in muscle tension Post-treatment instructions: Patient instructed to expect possible mild to moderate muscle soreness later today and/or tomorrow. Patient instructed in methods to reduce muscle soreness and to continue prescribed HEP. If patient was dry needled over the lung field, patient was instructed on signs and symptoms of pneumothorax and, however unlikely, to see immediate medical attention should they occur. Patient was also educated on signs and symptoms of infection and to seek medical attention should they occur. Patient verbalized understanding of these instructions and education.   [facility]                                                DATE: 12/24/21 Manual therapy: Skilled palpation to identify trigger points for TDN Trigger Point Dry-Needling  Treatment instructions: Expect mild to moderate muscle soreness. S/S  of pneumothorax if dry needled over a lung field, and to seek immediate medical attention should they occur. Patient verbalized understanding of these instructions and education. Patient Consent Given: Yes Education handout provided: No Muscles treated: R UT and R sub occipitals, R cervical multifidus Electrical stimulation performed: No Parameters: N/A Treatment  response/outcome: twitch                  PATIENT EDUCATION:  HEP.  Pt educated via explanation, demonstration, and handout (HEP).  Pt confirms understanding verbally.             HOME EXERCISE PROGRAM: None   ASTERISK SIGNS   [facility] Signs Eval (12/24/2021)            Cervical ROM L rotation 15 degrees            Pain level 9/10 max                                                             ASSESSMENT: CLINICAL IMPRESSION: Patient tolerated therapy well with no adverse effects. Continued with TPDN treatment this visit for left upper trap, and shoulder musculature with multiple trigger points and twitch responses elicited. He continues to reports left arm pain, primarily posterior aspect of left forearm and palm. He does demonstrate gross postural deviations so work incorporated doorway stretching exercises and shoulder blade squeezes. He tolerates chin tucks as long as he has 2 pillows under head, but when reducing pillows he notes onset and worsening of left arm symptoms. No changes to patient's HEP. He would benefit from continued skilled PT to reduce left arm pain and maximize his functional ability.    OBJECTIVE IMPAIRMENTS: Pain, cervical ROM   ACTIVITY LIMITATIONS: driving, lifting, severe pain during ADLs   PERSONAL FACTORS: See medical history and pertinent history     GOALS:  SHORT TERM GOALS: Target date: 01/14/2022   Thorn will be >75% HEP compliant to improve carryover between sessions and facilitate independent management of condition Evaluation (12/24/2021): ongoing Goal status: INITIAL     LONG TERM GOALS: Target date: 02/18/2022   Nethan will self report >/= 50% decrease in pain from evaluation  Evaluation/Baseline (12/24/2021): 9/10 max pain Goal status: INITIAL     2.  [place] will improve cervical L rotation to >/= 50 degrees Evaluation/Baseline (12/24/2021): 15 degrees with pain Goal status: INITIAL     3.  [place] will show a >/= 7.5 pt improvement in  their NDI score (MCID 7.5 pts) as a proxy for functional improvement Evaluation/Baseline (12/24/2021): Neck Disability Index: 17 / 50 = 34.0 % pts Goal status: INITIAL     4.  Keylin will be able to drive and complete essential ADLs with tolerable pain levels. Evaluation/Baseline (12/24/2021): pain as high as 9/10 Goal status: INITIAL     PLAN: PT FREQUENCY: 1-2x/week   PT DURATION: 8 weeks (Ending 02/18/2022)   PLANNED INTERVENTIONS: Therapeutic exercises, Aquatic therapy, Therapeutic activity, Neuro Muscular re-education, Gait training, Patient/Family education, Joint mobilization, Dry Needling, Electrical stimulation, Spinal mobilization and/or manipulation, Moist heat, Taping, Vasopneumatic device, Ionotophoresis '4mg'$ /ml Dexamethasone, and Manual therapy   PLAN FOR NEXT SESSION: TDN and manual therapy, progressive stretching and periscapular/cervical strengthening as tolerated (to tolerated on eval), assess response to L upper trap stretch   Hilda Blades,  PT, DPT, LAT, ATC 01/04/22  4:58 PM Phone: [phone] Fax: [phone]

## 2022-01-04 ENCOUNTER — Other Ambulatory Visit: Payer: Self-pay

## 2022-01-04 ENCOUNTER — Ambulatory Visit: Payer: Medicare Other | Admitting: Physical Therapy

## 2022-01-04 ENCOUNTER — Encounter: Payer: Self-pay | Admitting: Physical Therapy

## 2022-01-04 DIAGNOSIS — M542 Cervicalgia: Secondary | ICD-10-CM | POA: Diagnosis not present

## 2022-01-06 ENCOUNTER — Ambulatory Visit: Payer: Medicare Other | Admitting: Physical Therapy

## 2022-01-06 ENCOUNTER — Encounter: Payer: Self-pay | Admitting: Physical Therapy

## 2022-01-06 DIAGNOSIS — M542 Cervicalgia: Secondary | ICD-10-CM

## 2022-01-06 NOTE — Therapy (Signed)
OUTPATIENT PHYSICAL THERAPY TREATMENT NOTE   Patient Name: Matthew [name] MRN: [id number] DOB:[birth year], 71 y.o., male Today's Date: 01/06/2022  PCP: Lajean Manes, MD REFERRING PROVIDER: Lajean Manes, MD  END OF SESSION:   PT End of Session - 01/06/22 1355     Visit Number 4    Number of Visits --   1-2x/week   Date for PT Re-Evaluation 02/18/22    Authorization Type [employer] MCR    PT Start Time 1400    PT Stop Time 4098    PT Time Calculation (min) 41 min    Activity Tolerance Patient tolerated treatment well    Behavior During Therapy WFL for tasks assessed/performed              Past Medical History:  Diagnosis Date   Arthritis    Chronic low back pain    followed by [facility] -- dr Stanton Kidney bobca   Chronic narcotic use    DDD (degenerative disc disease), lumbosacral    ED (erectile dysfunction)    Esophageal diverticulum    GERD (gastroesophageal reflux disease)    Hiatal hernia    History of adenomatous polyp of colon    History of gout    History of Helicobacter pylori infection 09/2017   completed treatment   Hx of radiation therapy    08-04-2005  to  09-23-2015 prostate bed//   12-06-2011 to 12-07-2021  for prophylactic bilateral breast (prevention of painful gynecomastia)   Hyperlipidemia    Hypertension    IDA (iron deficiency anemia)    Numbness and tingling in left arm    per pt left shoulder and arm  intermittant tingling, stated he has had several test , unknown cause, but can put arm up in the air tingling resolves   Pre-diabetes    Recurrent prostate carcinoma [facility])    urologist--- dr Diona Fanti;  original dx 07/ 2006, Gleason 3+3, PSA 4.3 (04-18-2005 s/p radiacal prostatectomy w/ node dissection)//  recurrent w/ increasing PSA , completed salvage radiation of prostate bed 09-22-2005   Urethral stricture    Urinary incontinence    Wears partial dentures    upper   Past Surgical History:  Procedure Laterality Date   CATARACT EXTRACTION W/  INTRAOCULAR LENS IMPLANT Bilateral 2006   COLONOSCOPY  2021   CYSTOSCOPY WITH URETHRAL DILATATION  07/16/2009   '@[facility]'$  by dr Diona Fanti;   balloon dilatation of stricture   CYSTOSCOPY WITH URETHRAL DILATATION N/A 11/15/2021   Procedure: CYSTOSCOPY WITH URETHRAL BALLOON  DILATATION;  Surgeon: Franchot Gallo, MD;  Location: [facility];  Service: Urology;  Laterality: N/A;   ESOPHAGOGASTRODUODENOSCOPY (EGD) WITH ESOPHAGEAL DILATION  06/26/2018   by dr Loletha Carrow   INGUINAL HERNIA REPAIR Right 05/23/2012   NECK SURGERY     1990s;   per pt removal "goiter" that was benign, stated he does not think it was his thyroid   RETROPUBIC PROSTATECTOMY  04/18/2005   '@[facility]'$  by dr Diona Fanti   Patient Active Problem List   Diagnosis Date Noted   PAD (peripheral artery disease) ([place]) 06/19/2019   Chest pain 05/22/2019   Orthopnea 05/22/2019   Screening for AAA (abdominal aortic aneurysm) 05/22/2019   Tobacco abuse 05/22/2019   Impingement syndrome of right shoulder region 01/18/2018   Hypertension 07/10/2017   Pain of both shoulder joints 05/03/2017   Sleep disturbance 11/15/2012   Snoring 11/15/2012   Chronic back pain 11/15/2012   Cancer ([place]) 02/11/2005    REFERRING DIAG: Cervicalgia  THERAPY DIAG:  Cervicalgia  Rationale for Evaluation and Treatment Rehabilitation  SUBJECTIVE: Pt reports he is improving   PAIN:  Are you having pain? Yes:  NPRS scale: 5/10 Pain location: L arm Pain description: ache Aggravating factors: Driving Relieving factors: shaking my L arm, biofreeze and a heating pad   PERTINENT PAST HISTORY:  Gout, hx of prostate cancer        PRECAUTIONS: None   WEIGHT BEARING RESTRICTIONS No  Patient Goals/Specific Activities: reduce pain     OBJECTIVE: (objective measures completed at initial evaluation unless otherwise dated)   DIAGNOSTIC FINDINGS:  IMPRESSION: 1. Multilevel degenerative disc and joint changes as above. 2. Multilevel neuroforaminal  stenosis including mild left C2-3, moderate right-greater-than-left C3-4, severe right and moderate left C4-5, and severe left C5-6 neuroforaminal stenosis.   GENERAL OBSERVATION:          Forward head, rounded shoulders                               SENSATION:          Light touch: Appears intact on exam but n/t during subjective exam in L fingers           PALPATION: TTP L UT   Cervical ROM   ROM ROM  12/24/2021  Flexion 40  Extension 15**  Right lateral flexion 25  Left lateral flexion 15**  Right rotation 65  Left rotation 20**  Flexion rotation (normal is 30 degrees)    Flexion rotation (normal is 30 degrees)      (Blank rows = not tested, N = WNL, * = concordant pain)   UPPER EXTREMITY MMT:   MMT Right 12/24/2021 Left 12/24/2021  Shoulder flexion N N  Shoulder abduction (C5) C C  Shoulder ER      Shoulder IR      Middle trapezius      Lower trapezius      Shoulder extension      Grip strength 27kg 26kg  Cervical flexion (C1,C2)      Cervical S/B (C3)      Shoulder shrug (C4) C C  Elbow flexion (C6) C C  Elbow ext (C7) C C  Thumb ext (C8) C C  Finger abd (T1) C C  Grossly        (Blank rows = not tested, score listed is out of 5 possible points.  N = WNL, D = diminished, C = clear for gross weakness with myotome testing, * = concordant pain with testing)   SPECIAL TESTS:           Spurling (+)           ULTTA (+)           Phalen's and reverse phalen's (-)   JOINT MOBILITY TESTING:  Hypomobile throughout Cx pain   PATIENT SURVEYS:  Neck Disability Index: 17 / 50 = 34.0 %    TODAY'S TREATMENT:   [facility] Adult PT Treatment:                                                DATE: 01/06/2022 Therapeutic Exercise: UBE L5 x 5 min (2.5 fwd/bwd) while assessing response to dry needling Supine chin tuck with 2 pillows under head 2 x 10 with 5 sec hold with manual OP Doorway  pec stretch at 90 deg 2 x 30 sec Seated shoulder blade squeezes 10 x 5 sec Seated upper  trap stretch 2 x 20 sec Manual Therapy: Skilled palpation and monitoring of muscle tension while performing TPDN treatment STM for upper trap, cervical paraspinals, and sub occipitals Suboccipital release with gentle manual traction  Manual therapy: Skilled palpation to identify trigger points for TDN  Trigger Point Dry-Needling  Treatment instructions: Expect mild to moderate muscle soreness. S/S of pneumothorax if dry needled over a lung field, and to seek immediate medical attention should they occur. Patient verbalized understanding of these instructions and education.  Patient Consent Given: Yes Education handout provided: No Muscles treated: L UT, L sub occipitals, L cervical multifidi Electrical stimulation performed: Yes Parameters: 5 min low frequency - milli amps - low intensity; 5 min - micro amps - high frequency high intensity Treatment response/outcome: pain relief     [facility] Adult PT Treatment:                                                DATE: 01/04/2022 Therapeutic Exercise: UBE L5 x 4 min (2 fwd/bwd) while assessing response to dry needling Supine chin tuck with 2 pillows under head 2 x 10 with 5 sec hold Doorway pec stretch at 90 deg 2 x 30 sec Seated shoulder blade squeezes 10 x 5 sec Seated upper trap stretch 2 x 20 sec Manual Therapy: Skilled palpation and monitoring of muscle tension while performing TPDN treatment STM for upper trap, infraspinatus, deltoid regions Suboccipital release with gentle manual traction Trigger Point Dry Needling Treatment: Pre-treatment instruction: Patient instructed on dry needling rationale, procedures, and possible side effects including pain during treatment (achy,cramping feeling), bruising, drop of blood, lightheadedness, nausea, sweating. Patient Consent Given: Yes Education handout provided: Previously provided Muscles treated: Left upper trap, infraspinatus, middle/posterior deltoid  Needle size and number: .30x51m x  5 Electrical stimulation performed: No Parameters: N/A Treatment response/outcome: Twitch response elicited and Palpable decrease in muscle tension Post-treatment instructions: Patient instructed to expect possible mild to moderate muscle soreness later today and/or tomorrow. Patient instructed in methods to reduce muscle soreness and to continue prescribed HEP. If patient was dry needled over the lung field, patient was instructed on signs and symptoms of pneumothorax and, however unlikely, to see immediate medical attention should they occur. Patient was also educated on signs and symptoms of infection and to seek medical attention should they occur. Patient verbalized understanding of these instructions and education.   [facility]                                                [facility]: [facility] Therapeutic Exercise: DNF in supine c 2 pillows under head x10, 5 sec (tolerated well) Seated cervical retraction x3, pt reported L arm pain and the ex was stopped L Upper trap c R lat flexion c anchored L UE x4 15sec Manual Therapy: STM/DTM to the bilat upper trap and cervical paraspinals Manual cervical traction c R lat. Flexion Skilled palpation to identify trigger points for TPDN Trigger Point Dry Needling Treatment: Pre-treatment instruction: Patient instructed on dry needling rationale, procedures, and possible side effects including pain during treatment (achy,cramping feeling), bruising, drop of  blood, lightheadedness, nausea, sweating. Patient Consent Given: Yes Education handout provided: Yes Muscles treated: L upper trap, L lower multifidi, L lower cervical paraspinals Needle size and number: .30x68m x 2 Electrical stimulation performed: No Parameters: N/A Treatment response/outcome: Twitch response elicited and Palpable decrease in muscle tension Post-treatment instructions: Patient instructed to expect possible mild to moderate muscle soreness later today and/or tomorrow.  Patient instructed in methods to reduce muscle soreness and to continue prescribed HEP. If patient was dry needled over the lung field, patient was instructed on signs and symptoms of pneumothorax and, however unlikely, to see immediate medical attention should they occur. Patient was also educated on signs and symptoms of infection and to seek medical attention should they occur. Patient verbalized understanding of these instructions and education.   [facility]                                                [facility]: 12[facility]24[facility]21 Manual therapy: Skilled palpation to identify trigger points for TDN Trigger Point Dry-Needling  Treatment instructions: Expect mild to moderate muscle soreness. S/S of pneumothorax if dry needled over a lung field, and to seek immediate medical attention should they occur. Patient verbalized understanding of these instructions and education. Patient Consent Given: Yes Education handout provided: No Muscles treated: R UT and R sub occipitals, R cervical multifidus Electrical stimulation performed: No Parameters: N/A Treatment response/outcome: twitch                  PATIENT EDUCATION:  HEP.  Pt educated via explanation, demonstration, and handout (HEP).  Pt confirms understanding verbally.             HOME EXERCISE PROGRAM: None   ASTERISK SIGNS   [place] Signs Eval (12/24/2021) 6/15           Cervical ROM L rotation 15 degrees            Pain level 9/10 max 5/10                                                           ASSESSMENT: CLINICAL IMPRESSION: ADaltondid well with therapy and is making progress toward his goals.  He reports lower baseline pain and improvement with TDN.  I tolerates chin tuck with 2 pillows and gentle OP today with no increase in pain.  We trialed estim with TDN today and will monitor efficacy next session.  His main complaint at this point is dorsal forearm pain.  This may be more of a local issue as he had a past injury at  this site and local TTP along the extensor group.    OBJECTIVE IMPAIRMENTS: Pain, cervical ROM   ACTIVITY LIMITATIONS: driving, lifting, severe pain during ADLs   PERSONAL FACTORS: See medical history and pertinent history     GOALS:  SHORT TERM GOALS: Target date: 01/14/2022   ADemarkowill be >75% HEP compliant to improve carryover between sessions and facilitate independent management of condition Evaluation (12/24/2021): ongoing Goal status: INITIAL     LONG TERM GOALS: Target date: 02/18/2022   ATrustenwill self report >/= 50% decrease in pain from evaluation  Evaluation/Baseline (12/24/2021):  9/10 max pain Goal status: INITIAL     2.  [place] will improve cervical L rotation to >/= 50 degrees Evaluation/Baseline (12/24/2021): 15 degrees with pain Goal status: INITIAL     3.  [place] will show a >/= 7.5 pt improvement in their NDI score (MCID 7.5 pts) as a proxy for functional improvement Evaluation/Baseline (12/24/2021): Neck Disability Index: 17 / 50 = 34.0 % pts Goal status: INITIAL     4.  [place] will be able to drive and complete essential ADLs with tolerable pain levels. Evaluation/Baseline (12/24/2021): pain as high as 9/10 Goal status: INITIAL     PLAN: PT FREQUENCY: 1-2x/week   PT DURATION: 8 weeks (Ending 02/18/2022)   PLANNED INTERVENTIONS: Therapeutic exercises, Aquatic therapy, Therapeutic activity, Neuro Muscular re-education, Gait training, Patient/Family education, Joint mobilization, Dry Needling, Electrical stimulation, Spinal mobilization and/or manipulation, Moist heat, Taping, Vasopneumatic device, Ionotophoresis '4mg'$ /ml Dexamethasone, and Manual therapy   PLAN FOR NEXT SESSION: TDN and manual therapy, progressive stretching and periscapular/cervical strengthening as tolerated (to tolerated on eval), assess response to L upper trap stretch   Mathis Dad PT 01/06/22  2:44 PM Phone: [phone] Fax: [phone]

## 2022-01-11 ENCOUNTER — Encounter: Payer: Self-pay | Admitting: Physical Therapy

## 2022-01-11 ENCOUNTER — Ambulatory Visit: Payer: Medicare Other | Admitting: Physical Therapy

## 2022-01-11 DIAGNOSIS — M542 Cervicalgia: Secondary | ICD-10-CM | POA: Diagnosis not present

## 2022-01-11 NOTE — Therapy (Signed)
OUTPATIENT PHYSICAL THERAPY TREATMENT NOTE   Patient Name: Matthew [name] MRN: [id number] DOB:01/31/1951, 71 y.o., male Today's Date: 01/11/2022  PCP: Lajean Manes, MD REFERRING PROVIDER: Lajean Manes, MD  END OF SESSION:   PT End of Session - 01/11/22 1351     Visit Number 5    Number of Visits --   1-2x/week   Date for PT Re-Evaluation 02/18/22    Authorization Type [employer] MCR    PT Start Time 4496    PT Stop Time 1430    PT Time Calculation (min) 39 min    Activity Tolerance Patient tolerated treatment well    Behavior During Therapy WFL for tasks assessed/performed              Past Medical History:  Diagnosis Date   Arthritis    Chronic low back pain    followed by [facility] -- dr Stanton Kidney bobca   Chronic narcotic use    DDD (degenerative disc disease), lumbosacral    ED (erectile dysfunction)    Esophageal diverticulum    GERD (gastroesophageal reflux disease)    Hiatal hernia    History of adenomatous polyp of colon    History of gout    History of Helicobacter pylori infection 09/2017   completed treatment   Hx of radiation therapy    08-04-2005  to  09-23-2015 prostate bed//   12-06-2011 to 12-07-2021  for prophylactic bilateral breast (prevention of painful gynecomastia)   Hyperlipidemia    Hypertension    IDA (iron deficiency anemia)    Numbness and tingling in left arm    per pt left shoulder and arm  intermittant tingling, stated he has had several test , unknown cause, but can put arm up in the air tingling resolves   Pre-diabetes    Recurrent prostate carcinoma [facility])    urologist--- dr Diona Fanti;  original dx 07/ 2006, Gleason 3+3, PSA 4.3 (04-18-2005 s/p radiacal prostatectomy w/ node dissection)//  recurrent w/ increasing PSA , completed salvage radiation of prostate bed 09-22-2005   Urethral stricture    Urinary incontinence    Wears partial dentures    upper   Past Surgical History:  Procedure Laterality Date   CATARACT EXTRACTION W/  INTRAOCULAR LENS IMPLANT Bilateral 2006   COLONOSCOPY  2021   CYSTOSCOPY WITH URETHRAL DILATATION  07/16/2009   '@[facility]'$  by dr Diona Fanti;   balloon dilatation of stricture   CYSTOSCOPY WITH URETHRAL DILATATION N/A 11/15/2021   Procedure: CYSTOSCOPY WITH URETHRAL BALLOON  DILATATION;  Surgeon: Franchot Gallo, MD;  Location: [facility];  Service: Urology;  Laterality: N/A;   ESOPHAGOGASTRODUODENOSCOPY (EGD) WITH ESOPHAGEAL DILATION  06/26/2018   by dr Loletha Carrow   INGUINAL HERNIA REPAIR Right 05/23/2012   NECK SURGERY     1990s;   per pt removal "goiter" that was benign, stated he does not think it was his thyroid   RETROPUBIC PROSTATECTOMY  04/18/2005   '@[facility]'$  by dr Diona Fanti   Patient Active Problem List   Diagnosis Date Noted   PAD (peripheral artery disease) ([place]) 06/19/2019   Chest pain 05/22/2019   Orthopnea 05/22/2019   Screening for AAA (abdominal aortic aneurysm) 05/22/2019   Tobacco abuse 05/22/2019   Impingement syndrome of right shoulder region 01/18/2018   Hypertension 07/10/2017   Pain of both shoulder joints 05/03/2017   Sleep disturbance 11/15/2012   Snoring 11/15/2012   Chronic back pain 11/15/2012   Cancer ([place]) 02/11/2005    REFERRING DIAG: Cervicalgia  THERAPY DIAG:  Cervicalgia  Rationale for Evaluation and Treatment Rehabilitation  SUBJECTIVE: Pt reports he is improving   PAIN:  Are you having pain? Yes:  NPRS scale: 4/10 Pain location: L forearm  Pain description: ache Aggravating factors: Driving Relieving factors: shaking my L arm, biofreeze and a heating pad   PERTINENT PAST HISTORY:  Gout, hx of prostate cancer        PRECAUTIONS: None   WEIGHT BEARING RESTRICTIONS No  Patient Goals/Specific Activities: reduce pain     OBJECTIVE: (objective measures completed at initial evaluation unless otherwise dated)   DIAGNOSTIC FINDINGS:  IMPRESSION: 1. Multilevel degenerative disc and joint changes as above. 2. Multilevel  neuroforaminal stenosis including mild left C2-3, moderate right-greater-than-left C3-4, severe right and moderate left C4-5, and severe left C5-6 neuroforaminal stenosis.   GENERAL OBSERVATION:          Forward head, rounded shoulders                               SENSATION:          Light touch: Appears intact on exam but n/t during subjective exam in L fingers           PALPATION: TTP L UT   Cervical ROM   ROM ROM  12/24/2021  Flexion 40  Extension 15**  Right lateral flexion 25  Left lateral flexion 15**  Right rotation 65  Left rotation 20**  Flexion rotation (normal is 30 degrees)    Flexion rotation (normal is 30 degrees)      (Blank rows = not tested, N = WNL, * = concordant pain)   UPPER EXTREMITY MMT:   MMT Right 12/24/2021 Left 12/24/2021  Shoulder flexion N N  Shoulder abduction (C5) C C  Shoulder ER      Shoulder IR      Middle trapezius      Lower trapezius      Shoulder extension      Grip strength 27kg 26kg  Cervical flexion (C1,C2)      Cervical S/B (C3)      Shoulder shrug (C4) C C  Elbow flexion (C6) C C  Elbow ext (C7) C C  Thumb ext (C8) C C  Finger abd (T1) C C  Grossly        (Blank rows = not tested, score listed is out of 5 possible points.  N = WNL, D = diminished, C = clear for gross weakness with myotome testing, * = concordant pain with testing)   SPECIAL TESTS:           Spurling (+)           ULTTA (+)           Phalen's and reverse phalen's (-)   JOINT MOBILITY TESTING:  Hypomobile throughout Cx pain   PATIENT SURVEYS:  Neck Disability Index: 17 / 50 = 34.0 %    TODAY'S TREATMENT:    [facility] Adult PT Treatment:                                                DATE: 01/11/2022 Therapeutic Exercise: UBE L5.5 x 5 min (2.5 fwd/bwd) while assessing response to dry needling Supine chin tuck with 2 pillows under head 2 x 10 with 5 sec hold with manual  OP Doorway pec stretch at 90 deg 2 x 30 sec Seated shoulder blade squeezes 10 x 5  sec Seated upper trap stretch 2 x 20 sec Manual Therapy: Skilled palpation and monitoring of muscle tension while performing TPDN treatment STM for upper trap, cervical paraspinals, and sub occipitals  Trigger Point Dry-Needling  Treatment instructions: Expect mild to moderate muscle soreness. S/S of pneumothorax if dry needled over a lung field, and to seek immediate medical attention should they occur. Patient verbalized understanding of these instructions and education.  Patient Consent Given: Yes Education handout provided: No Muscles treated: L UT, L and R sub occipitals, L forearm extensor group Electrical stimulation performed: No Treatment response/outcome: pain relief  [facility] Adult PT Treatment:                                                DATE: 01/06/2022 Therapeutic Exercise: UBE L5 x 5 min (2.5 fwd/bwd) while assessing response to dry needling Supine chin tuck with 2 pillows under head 2 x 10 with 5 sec hold with manual OP Doorway pec stretch at 90 deg 2 x 30 sec Seated shoulder blade squeezes 10 x 5 sec Seated upper trap stretch 2 x 20 sec Manual Therapy: Skilled palpation and monitoring of muscle tension while performing TPDN treatment STM for upper trap, cervical paraspinals, and sub occipitals Suboccipital release with gentle manual traction  Manual therapy: Skilled palpation to identify trigger points for TDN  Trigger Point Dry-Needling  Treatment instructions: Expect mild to moderate muscle soreness. S/S of pneumothorax if dry needled over a lung field, and to seek immediate medical attention should they occur. Patient verbalized understanding of these instructions and education.  Patient Consent Given: Yes Education handout provided: No Muscles treated: L UT, L sub occipitals, L cervical multifidi Electrical stimulation performed: Yes Parameters: 5 min low frequency - milli amps - low intensity; 5 min - micro amps - high frequency high intensity Treatment  response/outcome: pain relief     [facility] Adult PT Treatment:                                                DATE: 01/04/2022 Therapeutic Exercise: UBE L5 x 4 min (2 fwd/bwd) while assessing response to dry needling Supine chin tuck with 2 pillows under head 2 x 10 with 5 sec hold Doorway pec stretch at 90 deg 2 x 30 sec Seated shoulder blade squeezes 10 x 5 sec Seated upper trap stretch 2 x 20 sec Manual Therapy: Skilled palpation and monitoring of muscle tension while performing TPDN treatment STM for upper trap, infraspinatus, deltoid regions Suboccipital release with gentle manual traction Trigger Point Dry Needling Treatment: Pre-treatment instruction: Patient instructed on dry needling rationale, procedures, and possible side effects including pain during treatment (achy,cramping feeling), bruising, drop of blood, lightheadedness, nausea, sweating. Patient Consent Given: Yes Education handout provided: Previously provided Muscles treated: Left upper trap, infraspinatus, middle/posterior deltoid  Needle size and number: .30x28mm x 5 Electrical stimulation performed: No Parameters: N/A Treatment response/outcome: Twitch response elicited and Palpable decrease in muscle tension Post-treatment instructions: Patient instructed to expect possible mild to moderate muscle soreness later today and/or tomorrow. Patient instructed in methods to reduce muscle soreness and to continue  prescribed HEP. If patient was dry needled over the lung field, patient was instructed on signs and symptoms of pneumothorax and, however unlikely, to see immediate medical attention should they occur. Patient was also educated on signs and symptoms of infection and to seek medical attention should they occur. Patient verbalized understanding of these instructions and education.   [facility] Adult PT Treatment:                                                DATE: 12/30/21 Therapeutic Exercise: DNF in supine c 2 pillows under  head x10, 5 sec (tolerated well) Seated cervical retraction x3, pt reported L arm pain and the ex was stopped L Upper trap c R lat flexion c anchored L UE x4 15sec Manual Therapy: STM/DTM to the bilat upper trap and cervical paraspinals Manual cervical traction c R lat. Flexion Skilled palpation to identify trigger points for TPDN Trigger Point Dry Needling Treatment: Pre-treatment instruction: Patient instructed on dry needling rationale, procedures, and possible side effects including pain during treatment (achy,cramping feeling), bruising, drop of blood, lightheadedness, nausea, sweating. Patient Consent Given: Yes Education handout provided: Yes Muscles treated: L upper trap, L lower multifidi, L lower cervical paraspinals Needle size and number: .30x66mm x 2 Electrical stimulation performed: No Parameters: N/A Treatment response/outcome: Twitch response elicited and Palpable decrease in muscle tension Post-treatment instructions: Patient instructed to expect possible mild to moderate muscle soreness later today and/or tomorrow. Patient instructed in methods to reduce muscle soreness and to continue prescribed HEP. If patient was dry needled over the lung field, patient was instructed on signs and symptoms of pneumothorax and, however unlikely, to see immediate medical attention should they occur. Patient was also educated on signs and symptoms of infection and to seek medical attention should they occur. Patient verbalized understanding of these instructions and education.   [facility] Adult PT Treatment:                                                DATE: 12/24/21 Manual therapy: Skilled palpation to identify trigger points for TDN Trigger Point Dry-Needling  Treatment instructions: Expect mild to moderate muscle soreness. S/S of pneumothorax if dry needled over a lung field, and to seek immediate medical attention should they occur. Patient verbalized understanding of these instructions and  education. Patient Consent Given: Yes Education handout provided: No Muscles treated: R UT and R sub occipitals, R cervical multifidus Electrical stimulation performed: No Parameters: N/A Treatment response/outcome: twitch               HOME EXERCISE PROGRAM: None   ASTERISK SIGNS   [facility] Signs Eval (12/24/2021) 6/15   6/20        Cervical ROM L rotation 15 degrees            Pain level 9/10 max 5/10 5/10 forearm to start, 0/10 following PT                                                          ASSESSMENT: CLINICAL  IMPRESSION: Zacory continues to progress well with therapy.  He is only having isolated wrist extensor pain today and some n/t in his L fingers.  Following treatment he had 0/10 pain and complete resolution of his sxs.  We will plan on D/C next visit barring any significant change in status.    OBJECTIVE IMPAIRMENTS: Pain, cervical ROM   ACTIVITY LIMITATIONS: driving, lifting, severe pain during ADLs   PERSONAL FACTORS: See medical history and pertinent history     GOALS:  SHORT TERM GOALS: Target date: 01/14/2022   Gum[place] will be >75% HEP compliant to improve carryover between sessions and facilitate independent management of condition Evaluation (12/24/2021): ongoing Goal status: MET 6/23     LONG TERM GOALS: Target date: 02/18/2022   Tevion will self report >/= 50% decrease in pain from evaluation  Evaluation/Baseline (12/24/2021): 9/10 max pain Goal status: INITIAL     2.  Jameire will improve cervical L rotation to >/= 50 degrees Evaluation/Baseline (12/24/2021): 15 degrees with pain Goal status: INITIAL     3.  Schon will show a >/= 7.5 pt improvement in their NDI score (MCID 7.5 pts) as a proxy for functional improvement Evaluation/Baseline (12/24/2021): Neck Disability Index: 17 / 50 = 34.0 % pts Goal status: INITIAL     4.  Zyquan will be able to drive and complete essential ADLs with tolerable pain levels. Evaluation/Baseline (12/24/2021): pain  as high as 9/10 Goal status: INITIAL     PLAN: PT FREQUENCY: 1-2x/week   PT DURATION: 8 weeks (Ending 02/18/2022)   PLANNED INTERVENTIONS: Therapeutic exercises, Aquatic therapy, Therapeutic activity, Neuro Muscular re-education, Gait training, Patient/Family education, Joint mobilization, Dry Needling, Electrical stimulation, Spinal mobilization and/or manipulation, Moist heat, Taping, Vasopneumatic device, Ionotophoresis 4mg /ml Dexamethasone, and Manual therapy   PLAN FOR NEXT SESSION: TDN and manual therapy, progressive stretching and periscapular/cervical strengthening as tolerated (to tolerated on eval), assess response to L upper trap stretch   Mathis Dad PT 01/11/22  2:38 PM Phone: [phone] Fax: [phone]

## 2022-01-13 ENCOUNTER — Encounter: Payer: Self-pay | Admitting: Physical Therapy

## 2022-01-13 ENCOUNTER — Ambulatory Visit: Payer: Medicare Other | Admitting: Physical Therapy

## 2022-01-13 DIAGNOSIS — M542 Cervicalgia: Secondary | ICD-10-CM | POA: Diagnosis not present

## 2022-01-13 NOTE — Therapy (Signed)
PHYSICAL THERAPY DISCHARGE SUMMARY  Visits from Start of Care: 6  Current functional level related to goals / functional outcomes: See assessment/goals   Remaining deficits: See assessment/goals   Education / Equipment: HEP and D/C plans  Patient agrees to discharge. Patient goals were met. Patient is being discharged due to meeting the stated rehab goals.   Patient Name: Matthew [name] MRN: [id number] DOB:11/21/1950, 71 y.o., male Today's Date: 01/13/2022  PCP: Lajean Manes, MD REFERRING PROVIDER: Lajean Manes, MD  END OF SESSION:   PT End of Session - 01/13/22 1347     Visit Number 6    Number of Visits --   1-2x/week   Date for PT Re-Evaluation 02/18/22    Authorization Type [employer] MCR    PT Start Time 1347    PT Stop Time 8938    PT Time Calculation (min) 38 min    Activity Tolerance Patient tolerated treatment well    Behavior During Therapy WFL for tasks assessed/performed              Past Medical History:  Diagnosis Date   Arthritis    Chronic low back pain    followed by [facility] -- dr Stanton Kidney bobca   Chronic narcotic use    DDD (degenerative disc disease), lumbosacral    ED (erectile dysfunction)    Esophageal diverticulum    GERD (gastroesophageal reflux disease)    Hiatal hernia    History of adenomatous polyp of colon    History of gout    History of Helicobacter pylori infection 09/2017   completed treatment   Hx of radiation therapy    08-04-2005  to  09-23-2015 prostate bed//   12-06-2011 to 12-07-2021  for prophylactic bilateral breast (prevention of painful gynecomastia)   Hyperlipidemia    Hypertension    IDA (iron deficiency anemia)    Numbness and tingling in left arm    per pt left shoulder and arm  intermittant tingling, stated he has had several test , unknown cause, but can put arm up in the air tingling resolves   Pre-diabetes    Recurrent prostate carcinoma [facility])    urologist--- dr Diona Fanti;  original dx 07/ 2006,  Gleason 3+3, PSA 4.3 (04-18-2005 s/p radiacal prostatectomy w/ node dissection)//  recurrent w/ increasing PSA , completed salvage radiation of prostate bed 09-22-2005   Urethral stricture    Urinary incontinence    Wears partial dentures    upper   Past Surgical History:  Procedure Laterality Date   CATARACT EXTRACTION W/ INTRAOCULAR LENS IMPLANT Bilateral 2006   COLONOSCOPY  2021   CYSTOSCOPY WITH URETHRAL DILATATION  07/16/2009   _0  by dr Diona Fanti;   balloon dilatation of stricture   CYSTOSCOPY WITH URETHRAL DILATATION N/A 11/15/2021   Procedure: CYSTOSCOPY WITH URETHRAL BALLOON  DILATATION;  Surgeon: Franchot Gallo, MD;  Location: [facility];  Service: Urology;  Laterality: N/A;   ESOPHAGOGASTRODUODENOSCOPY (EGD) WITH ESOPHAGEAL DILATION  06/26/2018   by dr Loletha Carrow   INGUINAL HERNIA REPAIR Right 05/23/2012   NECK SURGERY     1990s;   per pt removal "goiter" that was benign, stated he does not think it was his thyroid   RETROPUBIC PROSTATECTOMY  04/18/2005   _1  by dr Diona Fanti   Patient Active Problem List   Diagnosis Date Noted   PAD (peripheral artery disease) ([place]) 06/19/2019   Chest pain 05/22/2019   Orthopnea 05/22/2019   Screening for AAA (abdominal aortic aneurysm) 05/22/2019  Tobacco abuse 05/22/2019   Impingement syndrome of right shoulder region 01/18/2018   Hypertension 07/10/2017   Pain of both shoulder joints 05/03/2017   Sleep disturbance 11/15/2012   Snoring 11/15/2012   Chronic back pain 11/15/2012   Cancer ([place]) 02/11/2005    REFERRING DIAG: Cervicalgia  THERAPY DIAG:  Cervicalgia  Rationale for Evaluation and Treatment Rehabilitation  SUBJECTIVE:   Pt reports that he is feeling good.  He is having no pain.  He feels he is ready for D/C  PAIN:  Are you having pain? Yes:  NPRS scale: 0/10 Pain location: none Pain description: ache Aggravating factors: Driving Relieving factors: shaking my L arm, biofreeze and a heating  pad   PERTINENT PAST HISTORY:  Gout, hx of prostate cancer        PRECAUTIONS: None   WEIGHT BEARING RESTRICTIONS No  Patient Goals/Specific Activities: reduce pain     OBJECTIVE: (objective measures completed at initial evaluation unless otherwise dated)   DIAGNOSTIC FINDINGS:  IMPRESSION: 1. Multilevel degenerative disc and joint changes as above. 2. Multilevel neuroforaminal stenosis including mild left C2-3, moderate right-greater-than-left C3-4, severe right and moderate left C4-5, and severe left C5-6 neuroforaminal stenosis.   GENERAL OBSERVATION:          Forward head, rounded shoulders                               SENSATION:          Light touch: Appears intact on exam but n/t during subjective exam in L fingers           PALPATION: TTP L UT   Cervical ROM   ROM ROM  12/24/2021  Flexion 40  Extension 15**  Right lateral flexion 25  Left lateral flexion 15**  Right rotation 65  Left rotation 20**  Flexion rotation (normal is 30 degrees)    Flexion rotation (normal is 30 degrees)      (Blank rows = not tested, N = WNL, * = concordant pain)   UPPER EXTREMITY MMT:   MMT Right 12/24/2021 Left 12/24/2021  Shoulder flexion N N  Shoulder abduction (C5) C C  Shoulder ER      Shoulder IR      Middle trapezius      Lower trapezius      Shoulder extension      Grip strength 27kg 26kg  Cervical flexion (C1,C2)      Cervical S/B (C3)      Shoulder shrug (C4) C C  Elbow flexion (C6) C C  Elbow ext (C7) C C  Thumb ext (C8) C C  Finger abd (T1) C C  Grossly        (Blank rows = not tested, score listed is out of 5 possible points.  N = WNL, D = diminished, C = clear for gross weakness with myotome testing, * = concordant pain with testing)   SPECIAL TESTS:           Spurling (+)           ULTTA (+)           Phalen's and reverse phalen's (-)   JOINT MOBILITY TESTING:  Hypomobile throughout Cx pain   PATIENT SURVEYS:  Neck Disability Index: 17 / 50 =  34.0 %    TODAY'S TREATMENT:   [facility] Adult PT Treatment:  DATE: 01/13/2022 Therapeutic Exercise: UBE L5.5 x 5 min (2.5 fwd/bwd) while assessing response to dry needling  Therapeutic Activity - collecting information for goals, checking progress, and reviewing with patient  [facility] Adult PT Treatment:                                                DATE: 01/11/2022 Therapeutic Exercise: UBE L5.5 x 5 min (2.5 fwd/bwd) while assessing response to dry needling Supine chin tuck with 2 pillows under head 2 x 10 with 5 sec hold with manual OP Doorway pec stretch at 90 deg 2 x 30 sec Seated shoulder blade squeezes 10 x 5 sec Seated upper trap stretch 2 x 20 sec Manual Therapy: Skilled palpation and monitoring of muscle tension while performing TPDN treatment STM for upper trap, cervical paraspinals, and sub occipitals  Trigger Point Dry-Needling  Treatment instructions: Expect mild to moderate muscle soreness. S/S of pneumothorax if dry needled over a lung field, and to seek immediate medical attention should they occur. Patient verbalized understanding of these instructions and education.  Patient Consent Given: Yes Education handout provided: No Muscles treated: L UT, L and R sub occipitals, L forearm extensor group Electrical stimulation performed: No Treatment response/outcome: pain relief  [facility] Adult PT Treatment:                                                DATE: 01/06/2022 Therapeutic Exercise: UBE L5 x 5 min (2.5 fwd/bwd) while assessing response to dry needling Supine chin tuck with 2 pillows under head 2 x 10 with 5 sec hold with manual OP Doorway pec stretch at 90 deg 2 x 30 sec Seated shoulder blade squeezes 10 x 5 sec Seated upper trap stretch 2 x 20 sec Manual Therapy: Skilled palpation and monitoring of muscle tension while performing TPDN treatment STM for upper trap, cervical paraspinals, and sub occipitals Suboccipital  release with gentle manual traction  Manual therapy: Skilled palpation to identify trigger points for TDN  Trigger Point Dry-Needling  Treatment instructions: Expect mild to moderate muscle soreness. S/S of pneumothorax if dry needled over a lung field, and to seek immediate medical attention should they occur. Patient verbalized understanding of these instructions and education.  Patient Consent Given: Yes Education handout provided: No Muscles treated: L UT, L sub occipitals, L cervical multifidi Electrical stimulation performed: Yes Parameters: 5 min low frequency - milli amps - low intensity; 5 min - micro amps - high frequency high intensity Treatment response/outcome: pain relief     [facility] Adult PT Treatment:                                                DATE: 01/04/2022 Therapeutic Exercise: UBE L5 x 4 min (2 fwd/bwd) while assessing response to dry needling Supine chin tuck with 2 pillows under head 2 x 10 with 5 sec hold Doorway pec stretch at 90 deg 2 x 30 sec Seated shoulder blade squeezes 10 x 5 sec Seated upper trap stretch 2 x 20 sec Manual Therapy: Skilled palpation and monitoring of muscle tension while  performing TPDN treatment STM for upper trap, infraspinatus, deltoid regions Suboccipital release with gentle manual traction Trigger Point Dry Needling Treatment: Pre-treatment instruction: Patient instructed on dry needling rationale, procedures, and possible side effects including pain during treatment (achy,cramping feeling), bruising, drop of blood, lightheadedness, nausea, sweating. Patient Consent Given: Yes Education handout provided: Previously provided Muscles treated: Left upper trap, infraspinatus, middle/posterior deltoid  Needle size and number: .30x53m x 5 Electrical stimulation performed: No Parameters: N/A Treatment response/outcome: Twitch response elicited and Palpable decrease in muscle tension Post-treatment instructions: Patient instructed to  expect possible mild to moderate muscle soreness later today and/or tomorrow. Patient instructed in methods to reduce muscle soreness and to continue prescribed HEP. If patient was dry needled over the lung field, patient was instructed on signs and symptoms of pneumothorax and, however unlikely, to see immediate medical attention should they occur. Patient was also educated on signs and symptoms of infection and to seek medical attention should they occur. Patient verbalized understanding of these instructions and education.   [facility]                                                [facility]: [facility]30[facility] Therapeutic Exercise: DNF in supine c 2 pillows under head x10, 5 sec (tolerated well) Seated cervical retraction x3, pt reported L arm pain and the ex was stopped L Upper trap c R lat flexion c anchored L UE x4 15sec Manual Therapy: STM/DTM to the bilat upper trap and cervical paraspinals Manual cervical traction c R lat. Flexion Skilled palpation to identify trigger points for TPDN Trigger Point Dry Needling Treatment: Pre-treatment instruction: Patient instructed on dry needling rationale, procedures, and possible side effects including pain during treatment (achy,cramping feeling), bruising, drop of blood, lightheadedness, nausea, sweating. Patient Consent Given: Yes Education handout provided: Yes Muscles treated: L upper trap, L lower multifidi, L lower cervical paraspinals Needle size and number: .30x532mx 2 Electrical stimulation performed: No Parameters: N/A Treatment response/outcome: Twitch response elicited and Palpable decrease in muscle tension Post-treatment instructions: Patient instructed to expect possible mild to moderate muscle soreness later today and/or tomorrow. Patient instructed in methods to reduce muscle soreness and to continue prescribed HEP. If patient was dry needled over the lung field, patient was instructed on signs and symptoms of pneumothorax and,  however unlikely, to see immediate medical attention should they occur. Patient was also educated on signs and symptoms of infection and to seek medical attention should they occur. Patient verbalized understanding of these instructions and education.   [facility]                                                DATE: 12/[facility]/21 Manual therapy: Skilled palpation to identify trigger points for TDN Trigger Point Dry-Needling  Treatment instructions: Expect mild to moderate muscle soreness. S/S of pneumothorax if dry needled over a lung field, and to seek immediate medical attention should they occur. Patient verbalized understanding of these instructions and education. Patient Consent Given: Yes Education handout provided: No Muscles treated: R UT and R sub occipitals, R cervical multifidus Electrical stimulation performed: No Parameters: N/A Treatment response/outcome: twitch  HOME EXERCISE PROGRAM: None   ASTERISK SIGNS   [facility] Signs Eval (12/24/2021) 6/15   6/20        Cervical ROM L rotation 15 degrees            Pain level 9/10 max 5/10 5/10 forearm to start, 0/10 following PT                                                          ASSESSMENT: CLINICAL IMPRESSION: Matthew [name] has progressed very well with therapy.  Improved impairments include: cervical ROM and pain.  Functional improvements include: ability to complete ADLs including housework and yardwork.  Progressions needed include: continue with HEP.  Barriers to progress include: NA.  Please see GOALS section for progress on short term and long term goals established at evaluation.  I recommend D/C home with HEP; pt agrees with plan.    OBJECTIVE IMPAIRMENTS: Pain, cervical ROM   ACTIVITY LIMITATIONS: driving, lifting, severe pain during ADLs   PERSONAL FACTORS: See medical history and pertinent history     GOALS:  SHORT TERM GOALS: Target date: 01/14/2022   Patricio will be >75% HEP compliant  to improve carryover between sessions and facilitate independent management of condition Evaluation (12/24/2021): ongoing Goal status: MET 6/23     LONG TERM GOALS: Target date: 02/18/2022   Damien will self report >/= 50% decrease in pain from evaluation  Evaluation/Baseline (12/24/2021): 9/10 max pain 6/22: 0/10 pain Goal status: INITIAL     2.  Bocephus will improve cervical L rotation to >/= 50 degrees Evaluation/Baseline (12/24/2021): 15 degrees with pain 6/22: L 53 degrees R 60 degrees Goal status: INITIAL     3.  Avan will show a >/= 7.5 pt improvement in their NDI score (MCID 7.5 pts) as a proxy for functional improvement Evaluation/Baseline (12/24/2021): Neck Disability Index: 17 / 50 = 34.0 % pts 6/22: Neck Disability Index: 1 / 50 = 2.0 % Goal status: INITIAL     4.  Rondell will be able to drive and complete essential ADLs with tolerable pain levels. Evaluation/Baseline (12/24/2021): pain as high as 9/10 6/22: MET (able to paint the foundation of his house last weekend) Goal status: INITIAL     PLAN: PT FREQUENCY: 1-2x/week   PT DURATION: 8 weeks (Ending 02/18/2022)   PLANNED INTERVENTIONS: Therapeutic exercises, Aquatic therapy, Therapeutic activity, Neuro Muscular re-education, Gait training, Patient/Family education, Joint mobilization, Dry Needling, Electrical stimulation, Spinal mobilization and/or manipulation, Moist heat, Taping, Vasopneumatic device, Ionotophoresis 9m/ml Dexamethasone, and Manual therapy   PLAN FOR NEXT SESSION: TDN and manual therapy, progressive stretching and periscapular/cervical strengthening as tolerated (to tolerated on eval), assess response to L upper trap stretch   KMathis DadPT 01/13/22  2:03 PM Phone: [phone]: [phone]

## 2022-01-18 ENCOUNTER — Ambulatory Visit: Payer: Medicare Other | Admitting: Physical Therapy

## 2022-01-20 ENCOUNTER — Ambulatory Visit: Payer: Medicare Other | Admitting: Physical Therapy

## 2022-06-20 ENCOUNTER — Other Ambulatory Visit (HOSPITAL_COMMUNITY): Payer: Self-pay

## 2022-06-20 MED ORDER — OXYCODONE-ACETAMINOPHEN 10-325 MG PO TABS
1.0000 | ORAL_TABLET | Freq: Every day | ORAL | 0 refills | Status: AC
  Filled 2022-06-20: qty 150, 30d supply, fill #0

## 2022-07-14 ENCOUNTER — Other Ambulatory Visit (HOSPITAL_COMMUNITY): Payer: Self-pay

## 2022-07-15 ENCOUNTER — Other Ambulatory Visit (HOSPITAL_COMMUNITY): Payer: Self-pay

## 2022-07-15 MED ORDER — GABAPENTIN 300 MG PO CAPS
300.0000 mg | ORAL_CAPSULE | Freq: Every day | ORAL | 0 refills | Status: DC
  Filled 2022-07-15: qty 30, 30d supply, fill #0

## 2022-07-15 MED ORDER — OXYCODONE-ACETAMINOPHEN 10-325 MG PO TABS
1.0000 | ORAL_TABLET | Freq: Every day | ORAL | 0 refills | Status: DC | PRN
  Filled 2022-07-15 (×2): qty 150, 32d supply, fill #0
  Filled 2022-07-19: qty 150, 30d supply, fill #0

## 2022-07-19 ENCOUNTER — Other Ambulatory Visit (HOSPITAL_COMMUNITY): Payer: Self-pay

## 2022-07-19 ENCOUNTER — Other Ambulatory Visit: Payer: Self-pay

## 2022-07-20 ENCOUNTER — Other Ambulatory Visit (HOSPITAL_COMMUNITY): Payer: Self-pay

## 2022-07-27 ENCOUNTER — Ambulatory Visit
Admission: RE | Admit: 2022-07-27 | Discharge: 2022-07-27 | Disposition: A | Discharge status: Home / Self Care | Payer: Medicare Other | Source: Ambulatory Visit | Attending: Internal Medicine | Admitting: Internal Medicine

## 2022-07-27 ENCOUNTER — Other Ambulatory Visit: Payer: Self-pay | Admitting: Internal Medicine

## 2022-07-27 DIAGNOSIS — M545 Low back pain, unspecified: Secondary | ICD-10-CM

## 2022-07-27 DIAGNOSIS — M25551 Pain in right hip: Secondary | ICD-10-CM

## 2022-08-10 ENCOUNTER — Other Ambulatory Visit: Payer: Self-pay

## 2022-08-10 ENCOUNTER — Encounter: Payer: Self-pay | Admitting: Physical Therapy

## 2022-08-10 ENCOUNTER — Ambulatory Visit: Payer: Medicare Other | Attending: Internal Medicine | Admitting: Physical Therapy

## 2022-08-10 DIAGNOSIS — M6281 Muscle weakness (generalized): Secondary | ICD-10-CM | POA: Diagnosis present

## 2022-08-10 DIAGNOSIS — M25551 Pain in right hip: Secondary | ICD-10-CM | POA: Insufficient documentation

## 2022-08-10 DIAGNOSIS — M5459 Other low back pain: Secondary | ICD-10-CM | POA: Diagnosis present

## 2022-08-10 NOTE — Therapy (Signed)
OUTPATIENT PHYSICAL THERAPY EVALUATION   Patient Name: Matthew [name] MRN: [id number] DOB:10/08/1950, 72 y.o., male Today's Date: 08/10/2022  END OF SESSION:  PT End of Session - 08/10/22 1419     Visit Number 1    Number of Visits 9    Date for PT Re-Evaluation 10/05/22    Authorization Type UHC MCR    Progress Note Due on Visit 10    PT Start Time 1400    PT Stop Time 1445    PT Time Calculation (min) 45 min    Activity Tolerance Patient tolerated treatment well    Behavior During Therapy WFL for tasks assessed/performed             Past Medical History:  Diagnosis Date   Arthritis    Chronic low back pain    followed by [facility] -- Matthew Matthew [name]   Chronic narcotic use    DDD (degenerative disc disease), lumbosacral    ED (erectile dysfunction)    Esophageal diverticulum    GERD (gastroesophageal reflux disease)    Hiatal hernia    History of adenomatous polyp of colon    History of gout    History of Helicobacter pylori infection 09/2017   completed treatment   Hx of radiation therapy    08-04-2005  to  09-23-2015 prostate bed//   12-06-2011 to 12-07-2021  for prophylactic bilateral breast (prevention of painful gynecomastia)   Hyperlipidemia    Hypertension    IDA (iron deficiency anemia)    Numbness and tingling in left arm    per pt left shoulder and arm  intermittant tingling, stated he has had several test , unknown cause, but can put arm up in the air tingling resolves   Pre-diabetes    Recurrent prostate carcinoma [facility])    urologist--- Matthew [name];  original dx 07/ 2006, Gleason 3+3, PSA 4.3 (04-18-2005 s/p radiacal prostatectomy w/ node dissection)//  recurrent w/ increasing PSA , completed salvage radiation of prostate bed 09-22-2005   Urethral stricture    Urinary incontinence    Wears partial dentures    upper   Past Surgical History:  Procedure Laterality Date   CATARACT EXTRACTION W/ INTRAOCULAR LENS IMPLANT Bilateral 2006    COLONOSCOPY  2021   CYSTOSCOPY WITH URETHRAL DILATATION  07/16/2009   '@[facility]'$  by Matthew [name];   balloon dilatation of stricture   CYSTOSCOPY WITH URETHRAL DILATATION N/A 11/15/2021   Procedure: CYSTOSCOPY WITH URETHRAL BALLOON  DILATATION;  Surgeon: Matthew Gallo, MD;  Location: [facility];  Service: Urology;  Laterality: N/A;   ESOPHAGOGASTRODUODENOSCOPY (EGD) WITH ESOPHAGEAL DILATION  06/26/2018   by Matthew Matthew [name]   INGUINAL HERNIA REPAIR Right 05/23/2012   NECK SURGERY     1990s;   per pt removal "goiter" that was benign, stated he does not think it was his thyroid   RETROPUBIC PROSTATECTOMY  04/18/2005   '@[facility]'$  by Matthew [name]   Patient Active Problem List   Diagnosis Date Noted   PAD (peripheral artery disease) ([place]) 06/19/2019   Chest pain 05/22/2019   Orthopnea 05/22/2019   Screening for AAA (abdominal aortic aneurysm) 05/22/2019   Tobacco abuse 05/22/2019   Impingement syndrome of right shoulder region 01/18/2018   Hypertension 07/10/2017   Pain of both shoulder joints 05/03/2017   Sleep disturbance 11/15/2012   Snoring 11/15/2012   Chronic back pain 11/15/2012   Cancer ([place]) 02/11/2005    PCP: Matthew Frames, MD  REFERRING PROVIDER: Kathalene Frames, MD  REFERRING DIAG: Low back pain, unspecified  Rationale for Evaluation and Treatment: Rehabilitation  THERAPY DIAG:  Pain in right hip  Other low back pain  Muscle weakness (generalized)  ONSET DATE: ongoing for 2 months   SUBJECTIVE SUBJECTIVE STATEMENT: Patient reports his right hip has been giving him problems. States it depends on how or how long he sits. Some days he has good days and some bad days. Has been going on for about 2 months. He reports he was standing on a ladder and reaching across the top of his truck to clean in, and the next day he started having pain in the hip. The pain can travel all the way down the outside of his right leg to the bottom of his right foot. He also  notes low back pain that has been ongoing for 15 years. It does not bother him to lie on the right side when sleeping. He does report the bottom of his feet with go numb and sting, that has been happening for about a year.  PERTINENT HISTORY:  [name] PMH above  PAIN:  Are you having pain? Yes:  NPRS scale: 0/10 (6/10 at worst with sitting) Pain location: Right hip Pain description: Dull, aching Aggravating factors: Sitting long periods with legs crossed Relieving factors: Medication, walking  PRECAUTIONS: None  WEIGHT BEARING RESTRICTIONS: No  FALLS:  Has patient fallen in last 6 months? No  PLOF: Independent  PATIENT GOALS: Pain relief   OBJECTIVE:  PATIENT SURVEYS:  FOTO 63% functional status  SCREENING FOR RED FLAGS: Negative  COGNITION: Overall cognitive status: Within functional limits for tasks assessed     SENSATION: WFL  MUSCLE LENGTH: Bilateral hamstring and hip flexibility deficits  POSTURE:  Rounded shoulder and forward head, decreased lumbar lordosis  PALPATION: Mildly tender right glute med region, lumbar paraspinals  Hypomobility of lumbar spine  LUMBAR ROM:   AROM eval  Flexion 50%  Extension 25%  Right lateral flexion 50%  Left lateral flexion 50%  Right rotation 50%  Left rotation 50%   (Blank rows = not tested)  LOWER EXTREMITY ROM:      LE PROM grossly WFL  LOWER EXTREMITY MMT:    MMT Right eval Left eval  Hip flexion 4 4  Hip extension 3 3  Hip abduction 4- 4-  Knee flexion 5 5  Knee extension 5 5   (Blank rows = not tested)  LUMBAR SPECIAL TESTS:  Straight leg raise test: Positive  GAIT: Assistive device utilized: None Level of assistance: Complete Independence Comments: antalgic on right especially right after getting up from sitting, gait improved with walking   TODAY'S TREATMENT:        [facility] Adult PT Treatment:                                                DATE: 08/10/2022 Therapeutic Exercise: Supine  piriformis pull/push x 30 sec each LTR x 10 Sidelying hip abduction x 10 Seated hamstring stretch 2 x 30 sec  PATIENT EDUCATION:  Education details: Exam findings, POC, HEP Person educated: Patient Education method: Explanation, Demonstration, Tactile cues, Verbal cues, and Handouts Education comprehension: verbalized understanding, returned demonstration, verbal cues required, tactile cues required, and needs further education  HOME EXERCISE PROGRAM: Access Code: [email or web address]    ASSESSMENT: CLINICAL IMPRESSION: Patient is a 72  y.o. male who was  seen today for physical therapy evaluation and treatment for chronic low back and right hip and leg pain. He demonstrates limitations with lumbar mobility and general flexibility limitations, gross strength deficits of the hip and core. He seems to exhibit symptoms consistent with right radiculopathy.   OBJECTIVE IMPAIRMENTS: Abnormal gait, decreased activity tolerance, decreased ROM, decreased strength, impaired flexibility, postural dysfunction, and pain.   ACTIVITY LIMITATIONS: carrying, lifting, sitting, standing, and locomotion level  PARTICIPATION LIMITATIONS: meal prep, cleaning, community activity, and yard work  PERSONAL FACTORS: Fitness, Past/current experiences, and Time since onset of injury/illness/exacerbation are also affecting patient's functional outcome.   REHAB POTENTIAL: Fair, chronicity of symptoms  CLINICAL DECISION MAKING: Stable/uncomplicated  EVALUATION COMPLEXITY: Low   GOALS: Goals reviewed with patient? Yes  SHORT TERM GOALS: Target date: 09/07/2022  Patient will be I with initial HEP in order to progress with therapy. Baseline: HEP provided at eval Goal status: INITIAL  2.  PT will review FOTO with patient by 3rd visit in order to understand expected progress and outcome with therapy. Baseline: FOTO assessed at eval Goal status: INITIAL  3.  Patient will report right hip pain </= 3/10 with sitting for  extended periods and walking in order to reduce functional limitations Baseline: 6/10 Goal status: INITIAL  LONG TERM GOALS: Target date: 10/05/2022  Patient will be I with final HEP to maintain progress from PT. Baseline: HEP provided at eval Goal status: INITIAL  2.  Patient will report >/= 73% status on FOTO to indicate improved functional ability. Baseline: 63% Goal status: INITIAL  3.  Patient will demonstrate hip strength grossly >/= 4/5 MMT in order to improve ability to perform heavy activities without pain Baseline: strength deficits noted above Goal status: INITIAL  4.  Patient will report no limitation or increased pain with sitting to reduce functional limitations Baseline: increased pain with sitting extended periods Goal status: INITIAL   PLAN: PT FREQUENCY: 1-2x/week  PT DURATION: 8 weeks  PLANNED INTERVENTIONS: Therapeutic exercises, Therapeutic activity, Neuromuscular re-education, Balance training, Gait training, Patient/Family education, Self Care, Joint mobilization, Joint manipulation, Aquatic Therapy, Dry Needling, Electrical stimulation, Spinal manipulation, Spinal mobilization, Cryotherapy, Moist heat, Ionotophoresis '4mg'$ /ml Dexamethasone, Manual therapy, and Re-evaluation.  PLAN FOR NEXT SESSION: Review HEP and progress PRN, manual/mobs for lumbar and hip region, dry needling if indicated, stretching for LE flexibility deficits, progress core and hip strengthening as tolerated   Hilda Blades, PT, DPT, LAT, ATC 08/10/22  3:50 PM Phone: [phone] Fax: [phone]

## 2022-08-10 NOTE — Patient Instructions (Signed)
Access Code: [email or web address] URL: [email or web address] Date: 08/10/2022 Prepared by: Hilda Blades  Exercises - Supine Piriformis Stretch with Foot on Ground  - 2 x daily - 3 reps - 30 seconds hold - Supine Figure 4 Piriformis Stretch  - 2 x daily - 3 reps - 30 seconds hold - Supine Lower Trunk Rotation  - 2 x daily - 10 reps - 5 seconds hold - Sidelying Hip Abduction  - 1 x daily - 3 sets - 10 reps - Seated Hamstring Stretch  - 2 x daily - 3 reps - 30 seconds hold

## 2022-08-12 ENCOUNTER — Other Ambulatory Visit (HOSPITAL_COMMUNITY): Payer: Self-pay

## 2022-08-12 MED ORDER — TRAZODONE HCL 100 MG PO TABS
100.0000 mg | ORAL_TABLET | Freq: Every evening | ORAL | 1 refills | Status: DC
  Filled 2022-08-12: qty 30, 30d supply, fill #0
  Filled 2022-09-10: qty 30, 30d supply, fill #1

## 2022-08-12 MED ORDER — OXYCODONE-ACETAMINOPHEN 10-325 MG PO TABS
1.0000 | ORAL_TABLET | Freq: Every day | ORAL | 0 refills | Status: DC | PRN
  Filled 2022-08-12 – 2022-08-19 (×2): qty 150, 30d supply, fill #0

## 2022-08-12 MED ORDER — GABAPENTIN 300 MG PO CAPS
300.0000 mg | ORAL_CAPSULE | Freq: Every evening | ORAL | 0 refills | Status: DC
  Filled 2022-08-12: qty 30, 30d supply, fill #0

## 2022-08-12 MED ORDER — DULOXETINE HCL 30 MG PO CPEP
30.0000 mg | ORAL_CAPSULE | Freq: Every day | ORAL | 1 refills | Status: DC
  Filled 2022-08-12: qty 30, 30d supply, fill #0
  Filled 2022-09-10: qty 30, 30d supply, fill #1

## 2022-08-12 NOTE — Therapy (Addendum)
OUTPATIENT PHYSICAL THERAPY TREATMENT NOTE  DISCHARGE   Patient Name: Matthew [name] MRN: [id number] DOB:1951-05-29, 72 y.o., male Today's Date: 08/15/2022  PCP: Emilio Aspen, MD REFERRING PROVIDER: Emilio Aspen, MD   END OF SESSION:   PT End of Session - 08/15/22 1531     Visit Number 2    Number of Visits 9    Date for PT Re-Evaluation 10/05/22    Authorization Type UHC MCR    Progress Note Due on Visit 10    PT Start Time 1530    PT Stop Time 1610    PT Time Calculation (min) 40 min    Activity Tolerance Patient tolerated treatment well    Behavior During Therapy WFL for tasks assessed/performed             Past Medical History:  Diagnosis Date   Arthritis    Chronic low back pain    followed [facility] -- dr Corrie Dandy bobca   Chronic narcotic use    DDD (degenerative disc disease), lumbosacral    ED (erectile dysfunction)    Esophageal diverticulum    GERD (gastroesophageal reflux disease)    Hiatal hernia    History of adenomatous polyp of colon    History of gout    History of Helicobacter pylori infection 09/2017   completed treatment   Hx of radiation therapy    08-04-2005  to  09-23-2015 prostate bed//   12-06-2011 to 12-07-2021  for prophylactic bilateral breast (prevention of painful gynecomastia)   Hyperlipidemia    Hypertension    IDA (iron deficiency anemia)    Numbness and tingling in left arm    per pt left shoulder and arm  intermittant tingling, stated he has had several test , unknown cause, but can put arm up in the air tingling resolves   Pre-diabetes    Recurrent prostate carcinoma [facility])    urologist--- dr [name];  original dx 07/ 2006, Gleason 3+3, PSA 4.3 (04-18-2005 s/p radiacal prostatectomy w/ node dissection)//  recurrent w/ increasing PSA , completed salvage radiation of prostate bed 09-22-2005   Urethral stricture    Urinary incontinence    Wears partial dentures    upper   Past Surgical History:   Procedure Laterality Date   CATARACT EXTRACTION W/ INTRAOCULAR LENS IMPLANT Bilateral 2006   COLONOSCOPY  2021   CYSTOSCOPY WITH URETHRAL DILATATION  07/16/2009   @[facility]  by dr [name];   balloon dilatation of stricture   CYSTOSCOPY WITH URETHRAL DILATATION N/A 11/15/2021   Procedure: CYSTOSCOPY WITH URETHRAL BALLOON  DILATATION;  Surgeon: Marcine Matar, MD;  Location: [facility];  Service: Urology;  Laterality: N/A;   ESOPHAGOGASTRODUODENOSCOPY (EGD) WITH ESOPHAGEAL DILATION  06/26/2018   by dr Myrtie Neither   INGUINAL HERNIA REPAIR Right 05/23/2012   NECK SURGERY     1990s;   per pt removal "goiter" that was benign, stated he does not think it was his thyroid   RETROPUBIC PROSTATECTOMY  04/18/2005   @[facility]  by dr [name]   Patient Active Problem List   Diagnosis Date Noted   PAD (peripheral artery disease) ([facility]) 06/19/2019   Chest pain 05/22/2019   Orthopnea 05/22/2019   Screening for AAA (abdominal aortic aneurysm) 05/22/2019   Tobacco abuse 05/22/2019   Impingement syndrome of right shoulder region 01/18/2018   Hypertension 07/10/2017   Pain of both shoulder joints 05/03/2017   Sleep disturbance 11/15/2012   Snoring 11/15/2012   Chronic back pain 11/15/2012   Cancer (  [facility]) 02/11/2005    REFERRING DIAG: Low back pain, unspecified   THERAPY DIAG:  Pain in right hip  Other low back pain  Muscle weakness (generalized)  Rationale for Evaluation and Treatment Rehabilitation  PERTINENT HISTORY: See PMH above   PRECAUTIONS: None    SUBJECTIVE:              SUBJECTIVE STATEMENT:  Patient reports he is feeling better and doesn't think he will need any more therapy.   PAIN:  Are you having pain? No:  NPRS scale: 0/10 Pain location: Right hip Pain description: Dull, aching Aggravating factors: Sitting long periods with legs crossed Relieving factors: Medication, walking   OBJECTIVE: (objective measures completed at initial evaluation unless otherwise  dated) PATIENT SURVEYS:  FOTO 63% functional status  08/15/2022: 74%  MUSCLE LENGTH: Bilateral hamstring and hip flexibility deficits   POSTURE:  Rounded shoulder and forward head, decreased lumbar lordosis   PALPATION: Mildly tender right glute med region, lumbar paraspinals   Hypomobility of lumbar spine   LUMBAR ROM:    AROM eval  Flexion 50%  Extension 25%  Right lateral flexion 50%  Left lateral flexion 50%  Right rotation 50%  Left rotation 50%   (Blank rows = not tested)   LOWER EXTREMITY ROM:                          LE PROM grossly WFL   LOWER EXTREMITY MMT:     MMT Right eval Left eval  Hip flexion 4 4  Hip extension 3 3  Hip abduction 4- 4-  Knee flexion 5 5  Knee extension 5 5   (Blank rows = not tested)   LUMBAR SPECIAL TESTS:  Straight leg raise test: Positive   GAIT: Assistive device utilized: None Level of assistance: Complete Independence Comments: antalgic on right especially right after getting up from sitting, gait improved with walking     TODAY'S TREATMENT:        [facility] Adult PT Treatment:                                                DATE: 08/15/2022 Therapeutic Exercise: NuStep L6 x 5 min with UE/LE while taking subjective Supine piriformis pull/push 2 x 20 sec each LTR x 10 SLR 2 x 10 each Bridge 2 x 10 Sidelying hip abduction 2 x 10 each Seated hamstring stretch 2 x 30 sec Sit to stand 2 x 10   [facility] Adult PT Treatment:                                                DATE: 08/10/2022 Therapeutic Exercise: Supine piriformis pull/push x 30 sec each LTR x 10 Sidelying hip abduction x 10 Seated hamstring stretch 2 x 30 sec   PATIENT EDUCATION:  Education details: HEP update, FOTO Person educated: Patient Education method: Explanation, Demonstration, Verbal cues, and Handouts Education comprehension: verbalized understanding, returned demonstration, verbal cues required, and needs further education   HOME EXERCISE  PROGRAM: Access Code: [email or web address]      ASSESSMENT: CLINICAL IMPRESSION: Patient tolerated therapy well with no adverse effects. Therapy focused on progressing hip and lumbar mobility and progressing core  and hip strengthening with good tolerance. He denies any pain and did not report any increase pain with therapy. He does require cueing for proper exercise technique and abdominal activation. He reports improvement in his functional ability, achieving his FOTO LTG this visit. Updated his HEP to progress strengthening for home. Patient would benefit from continued skilled PT to progress his mobility and strength in order to reduce pain and maximize functional ability.     OBJECTIVE IMPAIRMENTS: Abnormal gait, decreased activity tolerance, decreased ROM, decreased strength, impaired flexibility, postural dysfunction, and pain.    ACTIVITY LIMITATIONS: carrying, lifting, sitting, standing, and locomotion level   PARTICIPATION LIMITATIONS: meal prep, cleaning, community activity, and yard work   PERSONAL FACTORS: Fitness, Past/current experiences, and Time since onset of injury/illness/exacerbation are also affecting patient's functional outcome.      GOALS: Goals reviewed with patient? Yes   SHORT TERM GOALS: Target date: 09/07/2022   Patient will be I with initial HEP in order to progress with therapy. Baseline: HEP provided at eval 08/15/2022: independent Goal status: MET   2.  PT will review FOTO with patient by 3rd visit in order to understand expected progress and outcome with therapy. Baseline: FOTO assessed at eval 08/15/2022: reviewed Goal status: MET   3.  Patient will report right hip pain </= 3/10 with sitting for extended periods and walking in order to reduce functional limitations Baseline: 6/10 08/15/2022: 0/10 Goal status: MET   LONG TERM GOALS: Target date: 10/05/2022   Patient will be I with final HEP to maintain progress from PT. Baseline: HEP provided at eval Goal  status: INITIAL   2.  Patient will report >/= 73% status on FOTO to indicate improved functional ability. Baseline: 63% 08/15/2022: 74% Goal status: MET   3.  Patient will demonstrate hip strength grossly >/= 4/5 MMT in order to improve ability to perform heavy activities without pain Baseline: strength deficits noted above Goal status: INITIAL   4.  Patient will report no limitation or increased pain with sitting to reduce functional limitations Baseline: increased pain with sitting extended periods Goal status: INITIAL     PLAN: PT FREQUENCY: 1-2x/week   PT DURATION: 8 weeks   PLANNED INTERVENTIONS: Therapeutic exercises, Therapeutic activity, Neuromuscular re-education, Balance training, Gait training, Patient/Family education, Self Care, Joint mobilization, Joint manipulation, Aquatic Therapy, Dry Needling, Electrical stimulation, Spinal manipulation, Spinal mobilization, Cryotherapy, Moist heat, Ionotophoresis 4mg /ml Dexamethasone, Manual therapy, and Re-evaluation.   PLAN FOR NEXT SESSION: Review HEP and progress PRN, manual/mobs for lumbar and hip region, dry needling if indicated, stretching for LE flexibility deficits, progress core and hip strengthening as tolerated   Rosana Hoes, PT, DPT, LAT, ATC 08/15/22  4:11 PM Phone: [phone] Fax: [phone]     PHYSICAL THERAPY DISCHARGE SUMMARY  Visits from Start of Care: 2  Current functional level related to goals / functional outcomes: See above   Remaining deficits: See above   Education / Equipment: HEP   Patient agrees to discharge. Patient goals were not met. Patient is being discharged due to not returning since the last visit.  Rosana Hoes, PT, DPT, LAT, ATC 11/24/22  9:33 AM Phone: [phone] Fax: [phone]

## 2022-08-15 ENCOUNTER — Other Ambulatory Visit: Payer: Self-pay

## 2022-08-15 ENCOUNTER — Encounter: Payer: Self-pay | Admitting: Physical Therapy

## 2022-08-15 ENCOUNTER — Ambulatory Visit: Payer: Medicare Other | Admitting: Physical Therapy

## 2022-08-15 DIAGNOSIS — M25551 Pain in right hip: Secondary | ICD-10-CM

## 2022-08-15 DIAGNOSIS — M5459 Other low back pain: Secondary | ICD-10-CM

## 2022-08-15 DIAGNOSIS — M6281 Muscle weakness (generalized): Secondary | ICD-10-CM

## 2022-08-15 NOTE — Patient Instructions (Signed)
Access Code: [email or web address] URL: [email or web address] Date: 08/15/2022 Prepared by: Hilda Blades  Exercises - Supine Piriformis Stretch with Foot on Ground  - 1 x daily - 3 reps - 30 seconds hold - Supine Figure 4 Piriformis Stretch  - 1 x daily - 3 reps - 30 seconds hold - Supine Lower Trunk Rotation  - 1 x daily - 10 reps - 5 seconds hold - Straight Leg Raise  - 1 x daily - 2 sets - 10 reps - Bridge  - 1 x daily - 2 sets - 10 reps - Sidelying Hip Abduction  - 1 x daily - 2 sets - 10 reps - Seated Hamstring Stretch  - 1 x daily - 3 reps - 30 seconds hold - Sit to Stand with Arms Crossed  - 1 x daily - 2 sets - 10 reps

## 2022-08-19 ENCOUNTER — Other Ambulatory Visit (HOSPITAL_COMMUNITY): Payer: Self-pay

## 2022-08-22 ENCOUNTER — Ambulatory Visit: Payer: Medicare Other | Admitting: Physical Therapy

## 2022-08-29 ENCOUNTER — Encounter: Payer: Medicare Other | Admitting: Physical Therapy

## 2022-09-05 ENCOUNTER — Encounter: Payer: Medicare Other | Admitting: Physical Therapy

## 2022-09-09 ENCOUNTER — Other Ambulatory Visit (HOSPITAL_BASED_OUTPATIENT_CLINIC_OR_DEPARTMENT_OTHER): Payer: Self-pay

## 2022-09-09 ENCOUNTER — Other Ambulatory Visit (HOSPITAL_COMMUNITY): Payer: Self-pay

## 2022-09-09 MED ORDER — OXYCODONE-ACETAMINOPHEN 10-325 MG PO TABS
1.0000 | ORAL_TABLET | Freq: Every day | ORAL | 0 refills | Status: DC | PRN
  Filled 2022-09-16: qty 150, 30d supply, fill #0

## 2022-09-09 MED ORDER — GABAPENTIN 300 MG PO CAPS
300.0000 mg | ORAL_CAPSULE | Freq: Every evening | ORAL | 0 refills | Status: DC
  Filled 2022-09-09: qty 30, 30d supply, fill #0

## 2022-09-10 ENCOUNTER — Other Ambulatory Visit (HOSPITAL_COMMUNITY): Payer: Self-pay

## 2022-09-16 ENCOUNTER — Other Ambulatory Visit (HOSPITAL_COMMUNITY): Payer: Self-pay

## 2022-10-03 DIAGNOSIS — H35033 Hypertensive retinopathy, bilateral: Secondary | ICD-10-CM | POA: Diagnosis not present

## 2022-10-03 DIAGNOSIS — H4323 Crystalline deposits in vitreous body, bilateral: Secondary | ICD-10-CM | POA: Diagnosis not present

## 2022-10-03 DIAGNOSIS — H02834 Dermatochalasis of left upper eyelid: Secondary | ICD-10-CM | POA: Diagnosis not present

## 2022-10-03 DIAGNOSIS — H02831 Dermatochalasis of right upper eyelid: Secondary | ICD-10-CM | POA: Diagnosis not present

## 2022-10-10 ENCOUNTER — Other Ambulatory Visit (HOSPITAL_COMMUNITY): Payer: Self-pay

## 2022-10-10 DIAGNOSIS — M5137 Other intervertebral disc degeneration, lumbosacral region: Secondary | ICD-10-CM | POA: Diagnosis not present

## 2022-10-10 DIAGNOSIS — M461 Sacroiliitis, not elsewhere classified: Secondary | ICD-10-CM | POA: Diagnosis not present

## 2022-10-10 DIAGNOSIS — G894 Chronic pain syndrome: Secondary | ICD-10-CM | POA: Diagnosis not present

## 2022-10-10 DIAGNOSIS — M47817 Spondylosis without myelopathy or radiculopathy, lumbosacral region: Secondary | ICD-10-CM | POA: Diagnosis not present

## 2022-10-10 MED ORDER — DULOXETINE HCL 30 MG PO CPEP
30.0000 mg | ORAL_CAPSULE | Freq: Every day | ORAL | 1 refills | Status: DC
  Filled 2022-10-10: qty 30, 30d supply, fill #0
  Filled 2022-11-10: qty 30, 30d supply, fill #1

## 2022-10-10 MED ORDER — OXYCODONE-ACETAMINOPHEN 10-325 MG PO TABS
1.0000 | ORAL_TABLET | Freq: Every day | ORAL | 0 refills | Status: AC | PRN
  Filled 2022-11-15: qty 150, 30d supply, fill #0

## 2022-10-10 MED ORDER — OXYCODONE-ACETAMINOPHEN 10-325 MG PO TABS
1.0000 | ORAL_TABLET | Freq: Every day | ORAL | 0 refills | Status: AC | PRN
  Filled 2022-10-10: qty 150, 32d supply, fill #0
  Filled 2022-10-15: qty 150, 30d supply, fill #0

## 2022-10-10 MED ORDER — TRAZODONE HCL 100 MG PO TABS
100.0000 mg | ORAL_TABLET | Freq: Every day | ORAL | 1 refills | Status: DC
  Filled 2022-10-10: qty 30, 30d supply, fill #0
  Filled 2022-11-10: qty 30, 30d supply, fill #1

## 2022-10-10 MED ORDER — DICLOFENAC SODIUM 1 % EX GEL
CUTANEOUS | 5 refills | Status: AC
  Filled 2022-10-10: qty 400, 25d supply, fill #0

## 2022-10-10 MED ORDER — GABAPENTIN 300 MG PO CAPS
300.0000 mg | ORAL_CAPSULE | Freq: Every day | ORAL | 1 refills | Status: DC
  Filled 2022-10-10: qty 30, 30d supply, fill #0
  Filled 2022-11-10: qty 30, 30d supply, fill #1

## 2022-10-15 ENCOUNTER — Other Ambulatory Visit (HOSPITAL_COMMUNITY): Payer: Self-pay

## 2022-11-10 ENCOUNTER — Other Ambulatory Visit (HOSPITAL_COMMUNITY): Payer: Self-pay

## 2022-11-15 ENCOUNTER — Other Ambulatory Visit (HOSPITAL_COMMUNITY): Payer: Self-pay

## 2022-12-05 ENCOUNTER — Other Ambulatory Visit (HOSPITAL_COMMUNITY): Payer: Self-pay

## 2022-12-05 DIAGNOSIS — M5137 Other intervertebral disc degeneration, lumbosacral region: Secondary | ICD-10-CM | POA: Diagnosis not present

## 2022-12-05 DIAGNOSIS — G894 Chronic pain syndrome: Secondary | ICD-10-CM | POA: Diagnosis not present

## 2022-12-05 DIAGNOSIS — M47817 Spondylosis without myelopathy or radiculopathy, lumbosacral region: Secondary | ICD-10-CM | POA: Diagnosis not present

## 2022-12-05 DIAGNOSIS — M461 Sacroiliitis, not elsewhere classified: Secondary | ICD-10-CM | POA: Diagnosis not present

## 2022-12-05 MED ORDER — TRAZODONE HCL 100 MG PO TABS
100.0000 mg | ORAL_TABLET | Freq: Every evening | ORAL | 1 refills | Status: DC
  Filled 2022-12-09: qty 30, 30d supply, fill #0
  Filled 2023-01-09: qty 30, 30d supply, fill #1

## 2022-12-05 MED ORDER — GABAPENTIN 300 MG PO CAPS
300.0000 mg | ORAL_CAPSULE | Freq: Every evening | ORAL | 1 refills | Status: DC
  Filled 2022-12-05: qty 30, 30d supply, fill #0
  Filled 2023-01-09: qty 30, 30d supply, fill #1

## 2022-12-05 MED ORDER — OXYCODONE-ACETAMINOPHEN 10-325 MG PO TABS
1.0000 | ORAL_TABLET | Freq: Every day | ORAL | 0 refills | Status: AC
  Filled 2023-01-16: qty 150, 30d supply, fill #0

## 2022-12-05 MED ORDER — DULOXETINE HCL 30 MG PO CPEP
30.0000 mg | ORAL_CAPSULE | Freq: Every day | ORAL | 1 refills | Status: DC
  Filled 2022-12-05: qty 30, 30d supply, fill #0
  Filled 2023-01-09: qty 30, 30d supply, fill #1

## 2022-12-05 MED ORDER — OXYCODONE-ACETAMINOPHEN 10-325 MG PO TABS
1.0000 | ORAL_TABLET | Freq: Every day | ORAL | 0 refills | Status: AC | PRN
  Filled 2022-12-05 – 2022-12-16 (×2): qty 150, 30d supply, fill #0

## 2022-12-08 DIAGNOSIS — I10 Essential (primary) hypertension: Secondary | ICD-10-CM | POA: Diagnosis not present

## 2022-12-08 DIAGNOSIS — J309 Allergic rhinitis, unspecified: Secondary | ICD-10-CM | POA: Diagnosis not present

## 2022-12-08 DIAGNOSIS — D696 Thrombocytopenia, unspecified: Secondary | ICD-10-CM | POA: Diagnosis not present

## 2022-12-09 ENCOUNTER — Other Ambulatory Visit (HOSPITAL_COMMUNITY): Payer: Self-pay

## 2022-12-16 ENCOUNTER — Other Ambulatory Visit (HOSPITAL_COMMUNITY): Payer: Self-pay

## 2023-01-09 ENCOUNTER — Other Ambulatory Visit (HOSPITAL_COMMUNITY): Payer: Self-pay

## 2023-01-16 ENCOUNTER — Other Ambulatory Visit (HOSPITAL_COMMUNITY): Payer: Self-pay

## 2023-01-31 ENCOUNTER — Other Ambulatory Visit (HOSPITAL_COMMUNITY): Payer: Self-pay

## 2023-01-31 DIAGNOSIS — M5137 Other intervertebral disc degeneration, lumbosacral region: Secondary | ICD-10-CM | POA: Diagnosis not present

## 2023-01-31 DIAGNOSIS — M461 Sacroiliitis, not elsewhere classified: Secondary | ICD-10-CM | POA: Diagnosis not present

## 2023-01-31 DIAGNOSIS — M47817 Spondylosis without myelopathy or radiculopathy, lumbosacral region: Secondary | ICD-10-CM | POA: Diagnosis not present

## 2023-01-31 DIAGNOSIS — G894 Chronic pain syndrome: Secondary | ICD-10-CM | POA: Diagnosis not present

## 2023-01-31 MED ORDER — DULOXETINE HCL 30 MG PO CPEP
30.0000 mg | ORAL_CAPSULE | Freq: Every day | ORAL | 1 refills | Status: DC
  Filled 2023-01-31 – 2023-02-01 (×2): qty 30, 30d supply, fill #0
  Filled 2023-03-04: qty 30, 30d supply, fill #1

## 2023-01-31 MED ORDER — OXYCODONE-ACETAMINOPHEN 10-325 MG PO TABS
1.0000 | ORAL_TABLET | Freq: Every day | ORAL | 0 refills | Status: AC
  Filled 2023-01-31 – 2023-02-14 (×2): qty 150, 30d supply, fill #0

## 2023-01-31 MED ORDER — TRAZODONE HCL 100 MG PO TABS
100.0000 mg | ORAL_TABLET | Freq: Every day | ORAL | 1 refills | Status: DC
  Filled 2023-01-31 – 2023-02-01 (×2): qty 30, 30d supply, fill #0
  Filled 2023-03-04: qty 30, 30d supply, fill #1

## 2023-01-31 MED ORDER — GABAPENTIN 300 MG PO CAPS
300.0000 mg | ORAL_CAPSULE | Freq: Every day | ORAL | 1 refills | Status: DC
  Filled 2023-01-31 – 2023-02-01 (×2): qty 30, 30d supply, fill #0
  Filled 2023-03-04: qty 30, 30d supply, fill #1

## 2023-01-31 MED ORDER — OXYCODONE-ACETAMINOPHEN 10-325 MG PO TABS
1.0000 | ORAL_TABLET | Freq: Every day | ORAL | 0 refills | Status: AC
  Filled 2023-03-16: qty 150, 30d supply, fill #0

## 2023-02-01 ENCOUNTER — Other Ambulatory Visit (HOSPITAL_COMMUNITY): Payer: Self-pay

## 2023-02-01 ENCOUNTER — Other Ambulatory Visit: Payer: Self-pay

## 2023-02-01 DIAGNOSIS — G894 Chronic pain syndrome: Secondary | ICD-10-CM | POA: Diagnosis not present

## 2023-02-01 DIAGNOSIS — Z79891 Long term (current) use of opiate analgesic: Secondary | ICD-10-CM | POA: Diagnosis not present

## 2023-02-14 ENCOUNTER — Other Ambulatory Visit (HOSPITAL_COMMUNITY): Payer: Self-pay

## 2023-03-04 ENCOUNTER — Other Ambulatory Visit (HOSPITAL_COMMUNITY): Payer: Self-pay

## 2023-03-16 ENCOUNTER — Other Ambulatory Visit (HOSPITAL_COMMUNITY): Payer: Self-pay

## 2023-04-04 ENCOUNTER — Other Ambulatory Visit (HOSPITAL_COMMUNITY): Payer: Self-pay

## 2023-04-04 DIAGNOSIS — M461 Sacroiliitis, not elsewhere classified: Secondary | ICD-10-CM | POA: Diagnosis not present

## 2023-04-04 DIAGNOSIS — G894 Chronic pain syndrome: Secondary | ICD-10-CM | POA: Diagnosis not present

## 2023-04-04 DIAGNOSIS — M5137 Other intervertebral disc degeneration, lumbosacral region: Secondary | ICD-10-CM | POA: Diagnosis not present

## 2023-04-04 DIAGNOSIS — M47817 Spondylosis without myelopathy or radiculopathy, lumbosacral region: Secondary | ICD-10-CM | POA: Diagnosis not present

## 2023-04-04 MED ORDER — TRAZODONE HCL 100 MG PO TABS
ORAL_TABLET | ORAL | 1 refills | Status: DC
  Filled 2023-04-04: qty 30, 30d supply, fill #0
  Filled 2023-05-02: qty 30, 30d supply, fill #1

## 2023-04-04 MED ORDER — OXYCODONE-ACETAMINOPHEN 10-325 MG PO TABS
1.0000 | ORAL_TABLET | Freq: Every day | ORAL | 0 refills | Status: AC | PRN
  Filled 2023-04-04 – 2023-04-15 (×2): qty 150, 30d supply, fill #0

## 2023-04-04 MED ORDER — OXYCODONE-ACETAMINOPHEN 10-325 MG PO TABS
1.0000 | ORAL_TABLET | Freq: Every day | ORAL | 0 refills | Status: AC | PRN
  Filled 2023-05-13: qty 150, 30d supply, fill #0

## 2023-04-04 MED ORDER — DULOXETINE HCL 30 MG PO CPEP
ORAL_CAPSULE | ORAL | 1 refills | Status: DC
  Filled 2023-04-04: qty 30, 30d supply, fill #0
  Filled 2023-05-02: qty 30, 30d supply, fill #1

## 2023-04-04 MED ORDER — GABAPENTIN 300 MG PO CAPS
300.0000 mg | ORAL_CAPSULE | Freq: Every evening | ORAL | 1 refills | Status: DC
  Filled 2023-04-04: qty 30, 30d supply, fill #0
  Filled 2023-05-02: qty 30, 30d supply, fill #1

## 2023-04-05 ENCOUNTER — Other Ambulatory Visit (HOSPITAL_COMMUNITY): Payer: Self-pay

## 2023-04-15 ENCOUNTER — Other Ambulatory Visit (HOSPITAL_COMMUNITY): Payer: Self-pay

## 2023-05-02 ENCOUNTER — Other Ambulatory Visit (HOSPITAL_COMMUNITY): Payer: Self-pay

## 2023-05-13 ENCOUNTER — Other Ambulatory Visit (HOSPITAL_COMMUNITY): Payer: Self-pay

## 2023-06-01 ENCOUNTER — Other Ambulatory Visit (HOSPITAL_COMMUNITY): Payer: Self-pay

## 2023-06-01 ENCOUNTER — Other Ambulatory Visit: Payer: Self-pay

## 2023-06-01 DIAGNOSIS — M461 Sacroiliitis, not elsewhere classified: Secondary | ICD-10-CM | POA: Diagnosis not present

## 2023-06-01 DIAGNOSIS — G894 Chronic pain syndrome: Secondary | ICD-10-CM | POA: Diagnosis not present

## 2023-06-01 DIAGNOSIS — M51379 Other intervertebral disc degeneration, lumbosacral region without mention of lumbar back pain or lower extremity pain: Secondary | ICD-10-CM | POA: Diagnosis not present

## 2023-06-01 DIAGNOSIS — M47817 Spondylosis without myelopathy or radiculopathy, lumbosacral region: Secondary | ICD-10-CM | POA: Diagnosis not present

## 2023-06-01 MED ORDER — OXYCODONE-ACETAMINOPHEN 10-325 MG PO TABS
1.0000 | ORAL_TABLET | Freq: Every day | ORAL | 0 refills | Status: DC | PRN
  Filled 2023-06-01: qty 150, 32d supply, fill #0
  Filled 2023-06-13: qty 150, 30d supply, fill #0

## 2023-06-01 MED ORDER — GABAPENTIN 300 MG PO CAPS
300.0000 mg | ORAL_CAPSULE | Freq: Every day | ORAL | 1 refills | Status: DC
  Filled 2023-06-01: qty 30, 30d supply, fill #0
  Filled 2023-06-24: qty 30, 30d supply, fill #1

## 2023-06-01 MED ORDER — TRAZODONE HCL 100 MG PO TABS
100.0000 mg | ORAL_TABLET | Freq: Every day | ORAL | 1 refills | Status: DC
  Filled 2023-06-01: qty 30, 30d supply, fill #0
  Filled 2023-06-24: qty 30, 30d supply, fill #1

## 2023-06-01 MED ORDER — DULOXETINE HCL 30 MG PO CPEP
30.0000 mg | ORAL_CAPSULE | Freq: Every day | ORAL | 1 refills | Status: DC
  Filled 2023-06-01: qty 30, 30d supply, fill #0
  Filled 2023-06-24: qty 30, 30d supply, fill #1

## 2023-06-13 ENCOUNTER — Other Ambulatory Visit (HOSPITAL_COMMUNITY): Payer: Self-pay

## 2023-06-26 ENCOUNTER — Other Ambulatory Visit (HOSPITAL_COMMUNITY): Payer: Self-pay

## 2023-06-29 DIAGNOSIS — Z0000 Encounter for general adult medical examination without abnormal findings: Secondary | ICD-10-CM | POA: Diagnosis not present

## 2023-06-29 DIAGNOSIS — Z79899 Other long term (current) drug therapy: Secondary | ICD-10-CM | POA: Diagnosis not present

## 2023-06-29 DIAGNOSIS — M545 Low back pain, unspecified: Secondary | ICD-10-CM | POA: Diagnosis not present

## 2023-06-29 DIAGNOSIS — E7800 Pure hypercholesterolemia, unspecified: Secondary | ICD-10-CM | POA: Diagnosis not present

## 2023-06-29 DIAGNOSIS — J309 Allergic rhinitis, unspecified: Secondary | ICD-10-CM | POA: Diagnosis not present

## 2023-06-29 DIAGNOSIS — D179 Benign lipomatous neoplasm, unspecified: Secondary | ICD-10-CM | POA: Diagnosis not present

## 2023-06-29 DIAGNOSIS — Z23 Encounter for immunization: Secondary | ICD-10-CM | POA: Diagnosis not present

## 2023-06-29 DIAGNOSIS — D696 Thrombocytopenia, unspecified: Secondary | ICD-10-CM | POA: Diagnosis not present

## 2023-06-29 DIAGNOSIS — I10 Essential (primary) hypertension: Secondary | ICD-10-CM | POA: Diagnosis not present

## 2023-06-29 DIAGNOSIS — M25551 Pain in right hip: Secondary | ICD-10-CM | POA: Diagnosis not present

## 2023-07-13 ENCOUNTER — Other Ambulatory Visit (HOSPITAL_COMMUNITY): Payer: Self-pay

## 2023-07-13 MED ORDER — OXYCODONE-ACETAMINOPHEN 10-325 MG PO TABS
1.0000 | ORAL_TABLET | Freq: Every day | ORAL | 0 refills | Status: AC | PRN
  Filled 2023-07-13: qty 150, 30d supply, fill #0

## 2023-07-24 ENCOUNTER — Other Ambulatory Visit (HOSPITAL_COMMUNITY): Payer: Self-pay

## 2023-07-27 ENCOUNTER — Other Ambulatory Visit (HOSPITAL_COMMUNITY): Payer: Self-pay

## 2023-07-27 DIAGNOSIS — G894 Chronic pain syndrome: Secondary | ICD-10-CM | POA: Diagnosis not present

## 2023-07-27 DIAGNOSIS — M461 Sacroiliitis, not elsewhere classified: Secondary | ICD-10-CM | POA: Diagnosis not present

## 2023-07-27 DIAGNOSIS — M47817 Spondylosis without myelopathy or radiculopathy, lumbosacral region: Secondary | ICD-10-CM | POA: Diagnosis not present

## 2023-07-27 DIAGNOSIS — M51379 Other intervertebral disc degeneration, lumbosacral region without mention of lumbar back pain or lower extremity pain: Secondary | ICD-10-CM | POA: Diagnosis not present

## 2023-07-27 MED ORDER — OXYCODONE-ACETAMINOPHEN 10-325 MG PO TABS
1.0000 | ORAL_TABLET | Freq: Every day | ORAL | 0 refills | Status: AC | PRN
  Filled 2023-08-12: qty 150, 30d supply, fill #0

## 2023-07-27 MED ORDER — TRAZODONE HCL 100 MG PO TABS
100.0000 mg | ORAL_TABLET | Freq: Every day | ORAL | 1 refills | Status: DC
  Filled 2023-07-27: qty 30, 30d supply, fill #0
  Filled 2023-08-23: qty 30, 30d supply, fill #1

## 2023-07-27 MED ORDER — DULOXETINE HCL 30 MG PO CPEP
30.0000 mg | ORAL_CAPSULE | Freq: Every day | ORAL | 1 refills | Status: DC
  Filled 2023-07-27: qty 30, 30d supply, fill #0
  Filled 2023-08-23: qty 30, 30d supply, fill #1

## 2023-07-27 MED ORDER — GABAPENTIN 300 MG PO CAPS
300.0000 mg | ORAL_CAPSULE | Freq: Every day | ORAL | 1 refills | Status: DC
  Filled 2023-07-27: qty 30, 30d supply, fill #0
  Filled 2023-08-23: qty 30, 30d supply, fill #1

## 2023-07-27 MED ORDER — OXYCODONE-ACETAMINOPHEN 10-325 MG PO TABS
1.0000 | ORAL_TABLET | Freq: Every day | ORAL | 0 refills | Status: AC | PRN
  Filled 2023-09-11: qty 150, 30d supply, fill #0

## 2023-07-28 ENCOUNTER — Other Ambulatory Visit (HOSPITAL_COMMUNITY): Payer: Self-pay

## 2023-08-12 ENCOUNTER — Other Ambulatory Visit (HOSPITAL_COMMUNITY): Payer: Self-pay

## 2023-08-26 ENCOUNTER — Other Ambulatory Visit (HOSPITAL_COMMUNITY): Payer: Self-pay

## 2023-09-11 ENCOUNTER — Other Ambulatory Visit (HOSPITAL_COMMUNITY): Payer: Self-pay

## 2023-09-18 ENCOUNTER — Other Ambulatory Visit (HOSPITAL_COMMUNITY): Payer: Self-pay

## 2023-09-21 ENCOUNTER — Other Ambulatory Visit (HOSPITAL_COMMUNITY): Payer: Self-pay

## 2023-09-21 ENCOUNTER — Other Ambulatory Visit: Payer: Self-pay

## 2023-09-21 DIAGNOSIS — M461 Sacroiliitis, not elsewhere classified: Secondary | ICD-10-CM | POA: Diagnosis not present

## 2023-09-21 DIAGNOSIS — M47817 Spondylosis without myelopathy or radiculopathy, lumbosacral region: Secondary | ICD-10-CM | POA: Diagnosis not present

## 2023-09-21 DIAGNOSIS — G894 Chronic pain syndrome: Secondary | ICD-10-CM | POA: Diagnosis not present

## 2023-09-21 DIAGNOSIS — M51379 Other intervertebral disc degeneration, lumbosacral region without mention of lumbar back pain or lower extremity pain: Secondary | ICD-10-CM | POA: Diagnosis not present

## 2023-09-21 MED ORDER — GABAPENTIN 300 MG PO CAPS
300.0000 mg | ORAL_CAPSULE | Freq: Every day | ORAL | 1 refills | Status: DC
  Filled 2023-09-21: qty 30, 30d supply, fill #0
  Filled 2023-10-18: qty 30, 30d supply, fill #1

## 2023-09-21 MED ORDER — OXYCODONE-ACETAMINOPHEN 10-325 MG PO TABS
1.0000 | ORAL_TABLET | Freq: Every day | ORAL | 0 refills | Status: AC | PRN
  Filled 2023-09-21 – 2023-10-10 (×2): qty 150, 30d supply, fill #0

## 2023-09-21 MED ORDER — OXYCODONE-ACETAMINOPHEN 10-325 MG PO TABS
1.0000 | ORAL_TABLET | Freq: Every day | ORAL | 0 refills | Status: DC
  Filled 2023-11-10: qty 150, 30d supply, fill #0

## 2023-09-21 MED ORDER — DULOXETINE HCL 30 MG PO CPEP
30.0000 mg | ORAL_CAPSULE | Freq: Every day | ORAL | 1 refills | Status: DC
  Filled 2023-09-21: qty 30, 30d supply, fill #0
  Filled 2023-10-18: qty 30, 30d supply, fill #1

## 2023-09-21 MED ORDER — TRAZODONE HCL 100 MG PO TABS
100.0000 mg | ORAL_TABLET | Freq: Every day | ORAL | 1 refills | Status: DC
Start: 2023-09-21 — End: 2023-11-16
  Filled 2023-09-21: qty 30, 30d supply, fill #0
  Filled 2023-10-18: qty 30, 30d supply, fill #1

## 2023-10-09 DIAGNOSIS — H4323 Crystalline deposits in vitreous body, bilateral: Secondary | ICD-10-CM | POA: Diagnosis not present

## 2023-10-09 DIAGNOSIS — Z961 Presence of intraocular lens: Secondary | ICD-10-CM | POA: Diagnosis not present

## 2023-10-09 DIAGNOSIS — H35033 Hypertensive retinopathy, bilateral: Secondary | ICD-10-CM | POA: Diagnosis not present

## 2023-10-09 DIAGNOSIS — H353131 Nonexudative age-related macular degeneration, bilateral, early dry stage: Secondary | ICD-10-CM | POA: Diagnosis not present

## 2023-10-10 ENCOUNTER — Other Ambulatory Visit (HOSPITAL_COMMUNITY): Payer: Self-pay

## 2023-11-10 ENCOUNTER — Other Ambulatory Visit (HOSPITAL_COMMUNITY): Payer: Self-pay

## 2023-11-15 DIAGNOSIS — N35919 Unspecified urethral stricture, male, unspecified site: Secondary | ICD-10-CM | POA: Diagnosis not present

## 2023-11-16 ENCOUNTER — Other Ambulatory Visit (HOSPITAL_COMMUNITY): Payer: Self-pay

## 2023-11-16 DIAGNOSIS — M461 Sacroiliitis, not elsewhere classified: Secondary | ICD-10-CM | POA: Diagnosis not present

## 2023-11-16 DIAGNOSIS — M47817 Spondylosis without myelopathy or radiculopathy, lumbosacral region: Secondary | ICD-10-CM | POA: Diagnosis not present

## 2023-11-16 DIAGNOSIS — M62830 Muscle spasm of back: Secondary | ICD-10-CM | POA: Diagnosis not present

## 2023-11-16 DIAGNOSIS — G894 Chronic pain syndrome: Secondary | ICD-10-CM | POA: Diagnosis not present

## 2023-11-16 MED ORDER — TRAZODONE HCL 100 MG PO TABS
100.0000 mg | ORAL_TABLET | Freq: Every day | ORAL | 1 refills | Status: DC
  Filled 2023-11-16: qty 30, 30d supply, fill #0
  Filled 2023-12-13: qty 30, 30d supply, fill #1

## 2023-11-16 MED ORDER — OXYCODONE-ACETAMINOPHEN 10-325 MG PO TABS
1.0000 | ORAL_TABLET | Freq: Every day | ORAL | 0 refills | Status: AC
  Filled 2023-12-08: qty 150, 30d supply, fill #0

## 2023-11-16 MED ORDER — OXYCODONE-ACETAMINOPHEN 10-325 MG PO TABS
1.0000 | ORAL_TABLET | Freq: Every day | ORAL | 0 refills | Status: AC | PRN
  Filled 2024-01-09: qty 150, 30d supply, fill #0

## 2023-11-16 MED ORDER — GABAPENTIN 300 MG PO CAPS
300.0000 mg | ORAL_CAPSULE | Freq: Every day | ORAL | 1 refills | Status: DC
Start: 2023-11-16 — End: 2024-01-15
  Filled 2023-11-16: qty 30, 30d supply, fill #0
  Filled 2023-12-13: qty 30, 30d supply, fill #1

## 2023-11-16 MED ORDER — DULOXETINE HCL 30 MG PO CPEP
30.0000 mg | ORAL_CAPSULE | Freq: Every day | ORAL | 1 refills | Status: DC
Start: 2023-11-16 — End: 2024-01-15
  Filled 2023-11-16: qty 30, 30d supply, fill #0
  Filled 2023-12-13: qty 30, 30d supply, fill #1

## 2023-12-08 ENCOUNTER — Other Ambulatory Visit (HOSPITAL_COMMUNITY): Payer: Self-pay

## 2024-01-01 DIAGNOSIS — I10 Essential (primary) hypertension: Secondary | ICD-10-CM | POA: Diagnosis not present

## 2024-01-01 DIAGNOSIS — N189 Chronic kidney disease, unspecified: Secondary | ICD-10-CM | POA: Diagnosis not present

## 2024-01-01 DIAGNOSIS — Z1211 Encounter for screening for malignant neoplasm of colon: Secondary | ICD-10-CM | POA: Diagnosis not present

## 2024-01-09 ENCOUNTER — Other Ambulatory Visit (HOSPITAL_COMMUNITY): Payer: Self-pay

## 2024-01-15 ENCOUNTER — Other Ambulatory Visit (HOSPITAL_COMMUNITY): Payer: Self-pay

## 2024-01-15 DIAGNOSIS — M62830 Muscle spasm of back: Secondary | ICD-10-CM | POA: Diagnosis not present

## 2024-01-15 DIAGNOSIS — M47817 Spondylosis without myelopathy or radiculopathy, lumbosacral region: Secondary | ICD-10-CM | POA: Diagnosis not present

## 2024-01-15 DIAGNOSIS — M461 Sacroiliitis, not elsewhere classified: Secondary | ICD-10-CM | POA: Diagnosis not present

## 2024-01-15 DIAGNOSIS — G894 Chronic pain syndrome: Secondary | ICD-10-CM | POA: Diagnosis not present

## 2024-01-15 MED ORDER — GABAPENTIN 300 MG PO CAPS
300.0000 mg | ORAL_CAPSULE | Freq: Every day | ORAL | 3 refills | Status: DC
  Filled 2024-01-15: qty 30, 30d supply, fill #0
  Filled 2024-02-07: qty 30, 30d supply, fill #1
  Filled 2024-03-08: qty 30, 30d supply, fill #2
  Filled 2024-04-07: qty 30, 30d supply, fill #3

## 2024-01-15 MED ORDER — OXYCODONE-ACETAMINOPHEN 10-325 MG PO TABS
1.0000 | ORAL_TABLET | Freq: Every day | ORAL | 0 refills | Status: DC | PRN
  Filled 2024-03-09: qty 150, 30d supply, fill #0

## 2024-01-15 MED ORDER — OXYCODONE-ACETAMINOPHEN 10-325 MG PO TABS
1.0000 | ORAL_TABLET | Freq: Every day | ORAL | 0 refills | Status: AC | PRN
  Filled 2024-02-07: qty 150, 30d supply, fill #0

## 2024-01-15 MED ORDER — TRAZODONE HCL 100 MG PO TABS
100.0000 mg | ORAL_TABLET | Freq: Every day | ORAL | 3 refills | Status: DC
  Filled 2024-01-15: qty 30, 30d supply, fill #0
  Filled 2024-02-07: qty 30, 30d supply, fill #1
  Filled 2024-03-08: qty 30, 30d supply, fill #2
  Filled 2024-04-07: qty 30, 30d supply, fill #3

## 2024-01-15 MED ORDER — DULOXETINE HCL 30 MG PO CPEP
30.0000 mg | ORAL_CAPSULE | Freq: Every day | ORAL | 3 refills | Status: DC
  Filled 2024-01-15: qty 30, 30d supply, fill #0
  Filled 2024-02-07: qty 30, 30d supply, fill #1
  Filled 2024-03-08: qty 30, 30d supply, fill #2
  Filled 2024-04-07: qty 30, 30d supply, fill #3

## 2024-01-17 DIAGNOSIS — I10 Essential (primary) hypertension: Secondary | ICD-10-CM | POA: Diagnosis not present

## 2024-01-17 DIAGNOSIS — N189 Chronic kidney disease, unspecified: Secondary | ICD-10-CM | POA: Diagnosis not present

## 2024-01-29 DIAGNOSIS — Z860101 Personal history of adenomatous and serrated colon polyps: Secondary | ICD-10-CM | POA: Diagnosis not present

## 2024-01-29 DIAGNOSIS — Z09 Encounter for follow-up examination after completed treatment for conditions other than malignant neoplasm: Secondary | ICD-10-CM | POA: Diagnosis not present

## 2024-02-07 ENCOUNTER — Other Ambulatory Visit (HOSPITAL_COMMUNITY): Payer: Self-pay

## 2024-03-09 ENCOUNTER — Other Ambulatory Visit (HOSPITAL_COMMUNITY): Payer: Self-pay

## 2024-03-11 ENCOUNTER — Other Ambulatory Visit (HOSPITAL_COMMUNITY): Payer: Self-pay

## 2024-03-11 DIAGNOSIS — M461 Sacroiliitis, not elsewhere classified: Secondary | ICD-10-CM | POA: Diagnosis not present

## 2024-03-11 DIAGNOSIS — G894 Chronic pain syndrome: Secondary | ICD-10-CM | POA: Diagnosis not present

## 2024-03-11 DIAGNOSIS — M47817 Spondylosis without myelopathy or radiculopathy, lumbosacral region: Secondary | ICD-10-CM | POA: Diagnosis not present

## 2024-03-11 DIAGNOSIS — M62830 Muscle spasm of back: Secondary | ICD-10-CM | POA: Diagnosis not present

## 2024-03-11 MED ORDER — OXYCODONE-ACETAMINOPHEN 10-325 MG PO TABS
1.0000 | ORAL_TABLET | Freq: Every day | ORAL | 0 refills | Status: AC | PRN
  Filled 2024-03-11: qty 150, 32d supply, fill #0
  Filled 2024-07-11: qty 150, 30d supply, fill #0

## 2024-03-11 MED ORDER — OXYCODONE-ACETAMINOPHEN 10-325 MG PO TABS
1.0000 | ORAL_TABLET | Freq: Every day | ORAL | 0 refills | Status: AC
  Filled 2024-04-11: qty 150, 30d supply, fill #0

## 2024-04-08 ENCOUNTER — Other Ambulatory Visit (HOSPITAL_COMMUNITY): Payer: Self-pay

## 2024-04-11 ENCOUNTER — Other Ambulatory Visit (HOSPITAL_COMMUNITY): Payer: Self-pay

## 2024-04-17 ENCOUNTER — Other Ambulatory Visit (HOSPITAL_COMMUNITY): Payer: Self-pay

## 2024-04-17 DIAGNOSIS — M461 Sacroiliitis, not elsewhere classified: Secondary | ICD-10-CM | POA: Diagnosis not present

## 2024-04-17 DIAGNOSIS — G894 Chronic pain syndrome: Secondary | ICD-10-CM | POA: Diagnosis not present

## 2024-04-17 DIAGNOSIS — M47817 Spondylosis without myelopathy or radiculopathy, lumbosacral region: Secondary | ICD-10-CM | POA: Diagnosis not present

## 2024-04-17 DIAGNOSIS — M62830 Muscle spasm of back: Secondary | ICD-10-CM | POA: Diagnosis not present

## 2024-04-17 MED ORDER — GABAPENTIN 300 MG PO CAPS
300.0000 mg | ORAL_CAPSULE | Freq: Every day | ORAL | 3 refills | Status: DC
  Filled 2024-04-17 – 2024-05-01 (×2): qty 30, 30d supply, fill #0
  Filled 2024-05-31: qty 30, 30d supply, fill #1
  Filled 2024-06-30: qty 30, 30d supply, fill #2
  Filled 2024-07-24: qty 30, 30d supply, fill #3

## 2024-04-17 MED ORDER — DULOXETINE HCL 30 MG PO CPEP
30.0000 mg | ORAL_CAPSULE | Freq: Every day | ORAL | 3 refills | Status: DC
  Filled 2024-05-01: qty 30, 30d supply, fill #0
  Filled 2024-05-31: qty 30, 30d supply, fill #1
  Filled 2024-06-30: qty 30, 30d supply, fill #2
  Filled 2024-07-24: qty 30, 30d supply, fill #3

## 2024-04-17 MED ORDER — TRAZODONE HCL 100 MG PO TABS
100.0000 mg | ORAL_TABLET | Freq: Every day | ORAL | 3 refills | Status: DC
  Filled 2024-05-01: qty 30, 30d supply, fill #0
  Filled 2024-05-31: qty 30, 30d supply, fill #1
  Filled 2024-06-30: qty 30, 30d supply, fill #2
  Filled 2024-07-24: qty 30, 30d supply, fill #3

## 2024-04-17 MED ORDER — OXYCODONE-ACETAMINOPHEN 10-325 MG PO TABS
1.0000 | ORAL_TABLET | Freq: Every day | ORAL | 0 refills | Status: AC
  Filled 2024-05-11: qty 150, 30d supply, fill #0

## 2024-04-17 MED ORDER — OXYCODONE-ACETAMINOPHEN 10-325 MG PO TABS
1.0000 | ORAL_TABLET | Freq: Every day | ORAL | 0 refills | Status: AC
  Filled 2024-06-11: qty 150, 30d supply, fill #0

## 2024-05-01 ENCOUNTER — Other Ambulatory Visit (HOSPITAL_COMMUNITY): Payer: Self-pay

## 2024-05-01 ENCOUNTER — Other Ambulatory Visit: Payer: Self-pay

## 2024-05-11 ENCOUNTER — Other Ambulatory Visit (HOSPITAL_COMMUNITY): Payer: Self-pay

## 2024-06-11 ENCOUNTER — Other Ambulatory Visit (HOSPITAL_COMMUNITY): Payer: Self-pay

## 2024-06-12 ENCOUNTER — Other Ambulatory Visit (HOSPITAL_COMMUNITY): Payer: Self-pay

## 2024-06-12 MED ORDER — OXYCODONE-ACETAMINOPHEN 10-325 MG PO TABS: 1.0000 | ORAL_TABLET | Freq: Every day | ORAL | 0 refills | Status: AC

## 2024-07-11 ENCOUNTER — Other Ambulatory Visit (HOSPITAL_COMMUNITY): Payer: Self-pay

## 2024-08-08 ENCOUNTER — Other Ambulatory Visit (HOSPITAL_COMMUNITY): Payer: Self-pay

## 2024-08-08 ENCOUNTER — Other Ambulatory Visit: Payer: Self-pay

## 2024-08-08 MED ORDER — OXYCODONE-ACETAMINOPHEN 10-325 MG PO TABS
1.0000 | ORAL_TABLET | Freq: Every day | ORAL | 0 refills | Status: AC | PRN
  Filled 2024-08-08: qty 150, 30d supply, fill #0

## 2024-08-08 MED ORDER — DULOXETINE HCL 30 MG PO CPEP
30.0000 mg | ORAL_CAPSULE | Freq: Every day | ORAL | 3 refills | Status: AC
  Filled 2024-08-08: qty 30, 30d supply, fill #0

## 2024-08-08 MED ORDER — GABAPENTIN 300 MG PO CAPS
300.0000 mg | ORAL_CAPSULE | Freq: Every day | ORAL | 3 refills | Status: AC
  Filled 2024-08-08: qty 30, 30d supply, fill #0

## 2024-08-08 MED ORDER — TRAZODONE HCL 100 MG PO TABS
100.0000 mg | ORAL_TABLET | Freq: Every day | ORAL | 3 refills | Status: AC
  Filled 2024-08-08: qty 30, 30d supply, fill #0

## 2024-08-08 MED ORDER — OXYCODONE-ACETAMINOPHEN 10-325 MG PO TABS: 1.0000 | ORAL_TABLET | Freq: Every day | ORAL | 0 refills | Status: AC | PRN

## 2024-08-10 ENCOUNTER — Other Ambulatory Visit (HOSPITAL_COMMUNITY): Payer: Self-pay
